# Patient Record
Sex: Male | Born: 1995 | Race: White | Hispanic: No | Marital: Single | Smoking: Never smoker
Health system: Southern US, Community
[De-identification: ages and names within clinical notes are randomized; demographics above are authoritative.]

## PROBLEM LIST (undated history)

## (undated) DIAGNOSIS — R000 Tachycardia, unspecified: Secondary | ICD-10-CM

## (undated) DIAGNOSIS — I4891 Unspecified atrial fibrillation: Secondary | ICD-10-CM

## (undated) HISTORY — PX: TYMPANOSTOMY TUBE PLACEMENT: SHX32

## (undated) HISTORY — PX: NO PAST SURGERIES: SHX2092

## (undated) HISTORY — PX: ABLATION: SHX5711

---
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
## 2016-09-17 ENCOUNTER — Encounter: Payer: Self-pay | Admitting: Emergency Medicine

## 2016-09-17 ENCOUNTER — Inpatient Hospital Stay
Admission: EM | Admit: 2016-09-17 | Discharge: 2016-09-19 | DRG: 917 | Disposition: A | Discharge status: Home / Self Care | Payer: Managed Care, Other (non HMO) | Attending: Internal Medicine | Admitting: Internal Medicine

## 2016-09-17 ENCOUNTER — Emergency Department: Payer: Managed Care, Other (non HMO)

## 2016-09-17 DIAGNOSIS — T450X1A Poisoning by antiallergic and antiemetic drugs, accidental (unintentional), initial encounter: Principal | ICD-10-CM | POA: Diagnosis present

## 2016-09-17 DIAGNOSIS — D72829 Elevated white blood cell count, unspecified: Secondary | ICD-10-CM | POA: Diagnosis present

## 2016-09-17 DIAGNOSIS — R000 Tachycardia, unspecified: Secondary | ICD-10-CM | POA: Diagnosis present

## 2016-09-17 DIAGNOSIS — G92 Toxic encephalopathy: Secondary | ICD-10-CM | POA: Diagnosis present

## 2016-09-17 DIAGNOSIS — F23 Brief psychotic disorder: Secondary | ICD-10-CM | POA: Diagnosis present

## 2016-09-17 DIAGNOSIS — T50901A Poisoning by unspecified drugs, medicaments and biological substances, accidental (unintentional), initial encounter: Secondary | ICD-10-CM | POA: Diagnosis present

## 2016-09-17 DIAGNOSIS — G9340 Encephalopathy, unspecified: Secondary | ICD-10-CM | POA: Diagnosis not present

## 2016-09-17 DIAGNOSIS — T50901D Poisoning by unspecified drugs, medicaments and biological substances, accidental (unintentional), subsequent encounter: Secondary | ICD-10-CM | POA: Diagnosis not present

## 2016-09-17 DIAGNOSIS — E876 Hypokalemia: Secondary | ICD-10-CM | POA: Diagnosis present

## 2016-09-17 LAB — URINE DRUG SCREEN, QUALITATIVE (ARMC ONLY)
Amphetamines, Ur Screen: NOT DETECTED
BARBITURATES, UR SCREEN: NOT DETECTED
BENZODIAZEPINE, UR SCRN: NOT DETECTED
CANNABINOID 50 NG, UR ~~LOC~~: NOT DETECTED
Cocaine Metabolite,Ur ~~LOC~~: NOT DETECTED
MDMA (Ecstasy)Ur Screen: NOT DETECTED
METHADONE SCREEN, URINE: NOT DETECTED
OPIATE, UR SCREEN: NOT DETECTED
PHENCYCLIDINE (PCP) UR S: NOT DETECTED
Tricyclic, Ur Screen: POSITIVE — AB

## 2016-09-17 LAB — COMPREHENSIVE METABOLIC PANEL
ALT: 21 U/L (ref 17–63)
AST: 26 U/L (ref 15–41)
Albumin: 4.9 g/dL (ref 3.5–5.0)
Alkaline Phosphatase: 69 U/L (ref 38–126)
Anion gap: 12 (ref 5–15)
BUN: 14 mg/dL (ref 6–20)
CHLORIDE: 102 mmol/L (ref 101–111)
CO2: 23 mmol/L (ref 22–32)
CREATININE: 1.05 mg/dL (ref 0.61–1.24)
Calcium: 9.5 mg/dL (ref 8.9–10.3)
GFR calc non Af Amer: >60 mL/min (ref >60)
Glucose, Bld: 107 mg/dL — ABNORMAL HIGH (ref 65–99)
Potassium: 3 mmol/L — ABNORMAL LOW (ref 3.5–5.1)
SODIUM: 137 mmol/L (ref 135–145)
Total Bilirubin: 0.9 mg/dL (ref 0.3–1.2)
Total Protein: 8.1 g/dL (ref 6.5–8.1)

## 2016-09-17 LAB — URINALYSIS, COMPLETE (UACMP) WITH MICROSCOPIC
Bacteria, UA: NONE SEEN
Bilirubin Urine: NEGATIVE
GLUCOSE, UA: NEGATIVE mg/dL
Hgb urine dipstick: NEGATIVE
KETONES UR: NEGATIVE mg/dL
Leukocytes, UA: NEGATIVE
Nitrite: NEGATIVE
PROTEIN: NEGATIVE mg/dL
RBC / HPF: NONE SEEN RBC/hpf (ref 0–5)
Specific Gravity, Urine: 1.012 (ref 1.005–1.030)
Squamous Epithelial / LPF: NONE SEEN
pH: 7 (ref 5.0–8.0)

## 2016-09-17 LAB — CBC WITH DIFFERENTIAL/PLATELET
Basophils Absolute: 0.1 10*3/uL (ref 0–0.1)
Basophils Relative: 0 %
EOS ABS: 0 10*3/uL (ref 0–0.7)
EOS PCT: 0 %
HCT: 45.1 % (ref 40.0–52.0)
Hemoglobin: 15.6 g/dL (ref 13.0–18.0)
LYMPHS ABS: 1.4 10*3/uL (ref 1.0–3.6)
Lymphocytes Relative: 7 %
MCH: 29.7 pg (ref 26.0–34.0)
MCHC: 34.5 g/dL (ref 32.0–36.0)
MCV: 86 fL (ref 80.0–100.0)
MONO ABS: 1 10*3/uL (ref 0.2–1.0)
Monocytes Relative: 5 %
Neutro Abs: 18 10*3/uL — ABNORMAL HIGH (ref 1.4–6.5)
Neutrophils Relative %: 88 %
PLATELETS: 221 10*3/uL (ref 150–440)
RBC: 5.25 MIL/uL (ref 4.40–5.90)
RDW: 12.8 % (ref 11.5–14.5)
WBC: 20.4 10*3/uL — AB (ref 3.8–10.6)

## 2016-09-17 LAB — ETHANOL: Alcohol, Ethyl (B): <5 mg/dL (ref <5)

## 2016-09-17 LAB — LACTIC ACID, PLASMA: LACTIC ACID, VENOUS: 1.1 mmol/L (ref 0.5–1.9)

## 2016-09-17 LAB — ACETAMINOPHEN LEVEL: Acetaminophen (Tylenol), Serum: <10 ug/mL — ABNORMAL LOW (ref 10–30)

## 2016-09-17 LAB — SALICYLATE LEVEL: Salicylate Lvl: <7 mg/dL (ref 2.8–30.0)

## 2016-09-17 LAB — TROPONIN I: Troponin I: <0.03 ng/mL (ref <0.03)

## 2016-09-17 MED ORDER — LORAZEPAM 2 MG/ML IJ SOLN
2.0000 mg | Freq: Once | INTRAMUSCULAR | Status: AC
  Administered 2016-09-17: 2 mg via INTRAVENOUS

## 2016-09-17 MED ORDER — HALOPERIDOL LACTATE 5 MG/ML IJ SOLN
5.0000 mg | Freq: Once | INTRAMUSCULAR | Status: AC
  Administered 2016-09-17: 5 mg via INTRAVENOUS

## 2016-09-17 MED ORDER — HALOPERIDOL LACTATE 5 MG/ML IJ SOLN
INTRAMUSCULAR | Status: AC
  Filled 2016-09-17: qty 1

## 2016-09-17 MED ORDER — SODIUM CHLORIDE 0.9 % IV SOLN
1000.0000 mL | Freq: Once | INTRAVENOUS | Status: AC
  Administered 2016-09-17: 1000 mL via INTRAVENOUS

## 2016-09-17 MED ORDER — LORAZEPAM 2 MG/ML IJ SOLN
2.0000 mg | Freq: Once | INTRAMUSCULAR | Status: AC
  Administered 2016-09-17: 2 mg via INTRAVENOUS
  Filled 2016-09-17: qty 1

## 2016-09-17 MED ORDER — LORAZEPAM 2 MG/ML IJ SOLN
INTRAMUSCULAR | Status: AC
  Administered 2016-09-17: 2 mg via INTRAVENOUS
  Filled 2016-09-17: qty 1

## 2016-09-17 MED ORDER — HALOPERIDOL LACTATE 5 MG/ML IJ SOLN
5.0000 mg | Freq: Four times a day (QID) | INTRAMUSCULAR | Status: DC | PRN
  Administered 2016-09-18: 5 mg via INTRAVENOUS
  Filled 2016-09-17: qty 1

## 2016-09-17 MED ORDER — SODIUM CHLORIDE 0.9 % IV SOLN: Freq: Once | INTRAVENOUS | Status: DC

## 2016-09-17 NOTE — ED Notes (Signed)
Pt trying to get out of bed, swinging at myself and friend who is at bedside.  MD notified.  See new orders.

## 2016-09-17 NOTE — ED Notes (Signed)
Pt remains agitated and delirious, trying to get up out of bed.  MD notified.  See new orders.

## 2016-09-17 NOTE — ED Notes (Addendum)
Pt agitated, trying to get up out of bed.  No PRN medication orders available at this time.  Admitting MD notified.

## 2016-09-17 NOTE — ED Triage Notes (Signed)
Pt arrived with girlfriend. Pt girlfriend states she found him laying in the bed acting the way he is in triage. Pt appears to be hallucinating. Pt staring at wall, when asked if he is seeing anything, pt states he is seeing cars on the wall. Pt denies drug use. Pt girlfriend states he does not use drugs. Pt appears confused in triage.

## 2016-09-17 NOTE — ED Notes (Signed)
Pt bed changed by this RN and Mayra, EDT. PT remains comfortable in bed at this time in NAD with family at bedside.

## 2016-09-17 NOTE — ED Notes (Signed)
Attempted to stand pt at bedside to use urinal; pt too delirious and incoherent to urinate at this time, will attempt for urine specimen at a later time.  MD aware.

## 2016-09-17 NOTE — H&P (Signed)
[facility] [place] [facility]   PATIENT NAME: Allen Wilson    MR#:  [id number]  DATE OF BIRTH:  Nov 13, 1995  DATE OF ADMISSION:  09/17/2016  PRIMARY CARE PHYSICIAN: Patient, No Pcp Per   REQUESTING/REFERRING PHYSICIAN: Emily FilbertWilliams, Jonathan E, MD  CHIEF COMPLAINT:   Chief Complaint  Patient presents with  . Possible Drug Overdose  . Hallucinations   HISTORY OF PRESENT ILLNESS:  Allen Wilson  is a 21 y.o. male with No known medical problem is being admitted for altered mental status, hallucination, likely due to drug overdose. Patient arrived with his girlfriend who states she found him lying in the bed acting the way he is currently. Patient is brought and apparently hallucinating and confused. Patient denies any drug use, was possibly seen at a vapor shop earlier today. He has no history of drug or alcohol abuse.  His mother is also at bedside providing most of the information.  She reports patient having some cold symptoms last night and tried taking some sinus medication and possibly Mucinex. He went to work today and come back around lunchtime at home, was found to be confused.  He was also vomiting and took some Emetrol per mother.  He does have some mouth dryness Per family. PAST MEDICAL HISTORY:  History reviewed. No pertinent past medical history. None PAST SURGICAL HISTORY:  No past surgical history on file. None SOCIAL HISTORY:   Social History  Substance Use Topics  . Smoking status: Unknown If Ever Smoked  . Smokeless tobacco: Not on file  . Alcohol use Not on file  He works as a Medical laboratory scientific officerdetailing car shop FAMILY HISTORY:  No family history on file. Mother with rheumatoid arthritis and father with hypertension DRUG ALLERGIES:  No Known Allergies  REVIEW OF SYSTEMS:   Review of Systems  Constitutional: Negative for chills, fever and weight loss.  HENT: Positive for congestion. Negative for nosebleeds and sore throat.   Eyes: Negative for  blurred vision.  Respiratory: Negative for cough, shortness of breath and wheezing.   Cardiovascular: Negative for chest pain, orthopnea, leg swelling and PND.  Gastrointestinal: Negative for abdominal pain, constipation, diarrhea, heartburn, nausea and vomiting.  Genitourinary: Negative for dysuria and urgency.  Musculoskeletal: Negative for back pain.  Skin: Negative for rash.  Neurological: Negative for dizziness, speech change, focal weakness and headaches.  Endo/Heme/Allergies: Does not bruise/bleed easily.  Psychiatric/Behavioral: Positive for hallucinations. Negative for depression. The patient is nervous/anxious.    MEDICATIONS AT HOME:   Prior to Admission medications   Not on File    He does not take any medications.  No over-the-counter medications or herbal medications  VITAL SIGNS:  Blood pressure (!) 129/104, pulse (!) 152, temperature 98.7 F (37.1 C), temperature source Oral, resp. rate (!) 23, height 6\' 1"  (1.854 m), weight 90.7 kg (200 lb), SpO2 98 %. PHYSICAL EXAMINATION:  Physical Exam  GENERAL:  21 y.o.-year-old patient lying in the bed with no acute distress.  EYES: Pupils equal, round, reactive to light and accommodation. No scleral icterus. Extraocular muscles intact.  HEENT: Head atraumatic, normocephalic. Oropharynx and nasopharynx clear.  NECK:  Supple, no jugular venous distention. No thyroid enlargement, no tenderness.  LUNGS: Normal breath sounds bilaterally, no wheezing, rales,rhonchi or crepitation. No use of accessory muscles of respiration.  CARDIOVASCULAR: S1, S2 normal. No murmurs, rubs, or gallops.  ABDOMEN: Soft, nontender, nondistended. Bowel sounds present. No organomegaly or mass.  EXTREMITIES: No pedal edema, cyanosis, or clubbing.  NEUROLOGIC: Cranial  nerves II through XII are intact. Muscle strength 5/5 in all extremities. Sensation intact. Gait not checked.  PSYCHIATRIC: The patient is alert and actively hallucinating.  Seems very  agitated SKIN: No obvious rash, lesion, or ulcer.  LABORATORY PANEL:   CBC  Recent Labs Lab 09/17/16 1644  WBC 20.4*  HGB 15.6  HCT 45.1  PLT 221   ------------------------------------------------------------------------------------------------------------------  Chemistries   Recent Labs Lab 09/17/16 1644  NA 137  K 3.0*  CL 102  CO2 23  GLUCOSE 107*  BUN 14  CREATININE 1.05  CALCIUM 9.5  AST 26  ALT 21  ALKPHOS 69  BILITOT 0.9   ------------------------------------------------------------------------------------------------------------------  Cardiac Enzymes  Recent Labs Lab 09/17/16 1644  TROPONINI <0.03   ------------------------------------------------------------------------------------------------------------------  RADIOLOGY:  Ct Head Wo Contrast  Result Date: 09/17/2016 CLINICAL DATA:  Altered mental status. EXAM: CT HEAD WITHOUT CONTRAST TECHNIQUE: Contiguous axial images were obtained from the base of the skull through the vertex without intravenous contrast. COMPARISON:  None. FINDINGS: Brain: No evidence of acute infarction, hemorrhage, hydrocephalus, extra-axial collection or mass lesion/mass effect. Vascular: No hyperdense vessel or unexpected calcification. Skull: Normal. Negative for fracture or focal lesion. Sinuses/Orbits: The bilateral paranasal sinuses and mastoid air cells are clear. The orbits are unremarkable. Other: None. IMPRESSION: 1. Normal noncontrast CT of the brain. Electronically Signed   By: Obie DredgeWilliam T Derry M.D.   On: 09/17/2016 17:38   IMPRESSION AND PLAN:   21 year old male with possible drug overdose   * Drug overdose  - His urine tox is positive for TCA.  Unknown ingestion  - Symptoms are consistent with anticholinergic toxicity  - EDP have contacted poison control and physostigmine was not recommended at this time.  - His serial EKGs look good with no QRS widening - We will admit him to ICU/stepdown  - Case discussed  with ICU nurse practitioner - Bincy  * Hypokalemia  - Replete and recheck   * Agitation/hallucinations - May need Ativan/Haldol to keep him calm - May require a sitter  * Leukocytosis - Likely stress response - He does not have any fever, we will hold off starting any antibiotics or ordering an infectious workup     All the records are reviewed and case discussed with ED provider. Management plans discussed with the patient, family (girlfriend and mother at bedside) and they are in agreement.  CODE STATUS: Full code  TOTAL TIME TAKING CARE OF THIS PATIENT: 45 minutes.    Delfino LovettVipul Wanisha Shiroma M.D on 09/17/2016 at 10:18 PM  Between 7am to 6pm - Pager - [phone]  After 6pm go to [email or web address] - Social research officer, [email or web address] [email or web address]  [facility] [place] [facility]  Office  [phone]  CC: Primary care physician; Patient, No Pcp Per   Note: This dictation was prepared with Dragon dictation along with smaller phrase technology. Any transcriptional errors that result from this process are unintentional.

## 2016-09-17 NOTE — ED Notes (Signed)
Pt has urinated on himself; wet clothing removed, new gown placed, partial linen change done.

## 2016-09-17 NOTE — ED Notes (Signed)
This RN to bedside at this time to in/out cath pt to receive urine specimen per verbal MD order.  Susy FrizzleMatt, RN and Theodoro Gristave, ED Medic at bedside to assist.

## 2016-09-17 NOTE — ED Provider Notes (Signed)
[facility] Emergency Department Provider Note       Time seen: ----------------------------------------- 4:39 PM on 09/17/2016 -----------------------------------------   Level V caveat: History/ROS limited by altered mental status  I have reviewed the triage vital signs and the nursing notes.   HISTORY   Chief Complaint Possible Drug Overdose and Hallucinations    HPI Allen Wilson is a 21 y.o. male who presents to the ED for altered mental status. Patient arrived with his girlfriend who states she found him lying in the bed acting the way he is currently. Patient is brought and apparently hallucinating and confused. Patient denies any drug use, was possibly seen at a vapor shop earlier today. He has no history of drug or alcohol abuse. No further information is available.   History reviewed. No pertinent past medical history.  There are no active problems to display for this patient.   No past surgical history on file.  Allergies Patient has no known allergies.  Social History Social History  Substance Use Topics  . Smoking status: Unknown If Ever Smoked  . Smokeless tobacco: Not on file  . Alcohol use Not on file    Review of Systems Unknown, patient appears confused and psychotic  All systems negative/normal/unremarkable except as stated in the HPI  ____________________________________________   PHYSICAL EXAM:  VITAL SIGNS: ED Triage Vitals  Enc Vitals Group     BP 09/17/16 1630 (!) 151/102     Pulse Rate 09/17/16 1630 (!) 152     Resp 09/17/16 1630 18     Temp 09/17/16 1630 98.7 F (37.1 C)     Temp Source 09/17/16 1630 Oral     SpO2 09/17/16 1630 98 %     Weight 09/17/16 1628 200 lb (90.7 kg)     Height 09/17/16 1628 6\' 1"  (1.854 m)     Head Circumference --      Peak Flow --      Pain Score --      Pain Loc --      Pain Edu? --      Excl. in GC? --     Constitutional: Alert And disoriented, moderate to severe  distress  Eyes: Conjunctivae are normal. Normal extraocular movements. Pupils are dilated but reactive bilaterally ENT   Head: Normocephalic and atraumatic.   Nose: No congestion/rhinnorhea.   Mouth/Throat: Mucous membranes are moist.   Neck: No stridor. Cardiovascular: Rapid rate, regular rhythm. No murmurs, rubs, or gallops. Respiratory: Normal respiratory effort without tachypnea nor retractions. Breath sounds are clear and equal bilaterally. No wheezes/rales/rhonchi. Gastrointestinal: Soft and nontender. Normal bowel sounds Musculoskeletal: Nontender with normal range of motion in extremities. No lower extremity tenderness nor edema. Neurologic:  Normal speech and language. No gross focal neurologic deficits are appreciated.  Skin:  Skin is warm, dry and intact. No rash noted. Psychiatric: Mood and affect are normal. Speech and behavior are normal.  ____________________________________________  EKG: Interpreted by me. Sinus tachycardia with rate 106 bpm, normal PR interval, normal QRS, normal QT.  Repeat EKG reveals sinus tachycardia at a rate of 121 bpm, normal PR interval, normal QRS size, normal QT. ____________________________________________  ED COURSE:  Pertinent labs & imaging results that were available during my care of the patient were reviewed by me and considered in my medical decision making (see chart for details). Patient presents for altered mental status and psychosis, we will assess with labs and imaging as indicated. Possible anticholinergic toxicity Clinical Course as of Sep 18 2106  Wed Sep 17, 2016  1939 Patient has received 6 mg of IV Ativan without significant improvement. He is still agitated, I will give IV Haldol.  [JW]    Clinical Course User Index [JW] Emily Filbert, MD   Procedures ____________________________________________   LABS (pertinent positives/negatives)  Labs Reviewed  CBC WITH DIFFERENTIAL/PLATELET - Abnormal;  Notable for the following:       Result Value   WBC 20.4 (*)    Neutro Abs 18.0 (*)    All other components within normal limits  COMPREHENSIVE METABOLIC PANEL - Abnormal; Notable for the following:    Potassium 3.0 (*)    Glucose, Bld 107 (*)    All other components within normal limits  URINALYSIS, COMPLETE (UACMP) WITH MICROSCOPIC - Abnormal; Notable for the following:    Color, Urine YELLOW (*)    APPearance CLEAR (*)    All other components within normal limits  URINE DRUG SCREEN, QUALITATIVE ([facility] ONLY) - Abnormal; Notable for the following:    Tricyclic, Ur Screen POSITIVE (*)    All other components within normal limits  ACETAMINOPHEN LEVEL - Abnormal; Notable for the following:    Acetaminophen (Tylenol), Serum <10 (*)    All other components within normal limits  CULTURE, BLOOD (ROUTINE X 2)  CULTURE, BLOOD (ROUTINE X 2)  TROPONIN I  ETHANOL  SALICYLATE LEVEL  LACTIC ACID, PLASMA  LACTIC ACID, PLASMA  CBG MONITORING, ED   CRITICAL CARE Performed by: Emily Filbert   Total critical care time: 30 minutes  Critical care time was exclusive of separately billable procedures and treating other patients.  Critical care was necessary to treat or prevent imminent or life-threatening deterioration.  Critical care was time spent personally by me on the following activities: development of treatment plan with patient and/or surrogate as well as nursing, discussions with consultants, evaluation of patient's response to treatment, examination of patient, obtaining history from patient or surrogate, ordering and performing treatments and interventions, ordering and review of laboratory studies, ordering and review of radiographic studies, pulse oximetry and re-evaluation of patient's condition.   RADIOLOGY Images were viewed by me  CT head  IMPRESSION: 1. Normal noncontrast CT of the brain. ____________________________________________  FINAL ASSESSMENT AND  PLAN  Acute psychosis, possible anticholinergic toxicity  Plan: Patient's labs and imaging were dictated above. Patient had presented for altered mental status and was extremely agitated and combative on arrival. This is an acute change in his mental status according to family. He was complaining of some cold symptoms and has taken over-the-counter cold medicine as well as over-the-counter anti-medic. Other than that he has not known to take illicit drugs or drink alcohol. He has not been describing suicidal behavior. Throughout his stay he has required intermittent medications for agitation including Ativan and Haldol. Symptoms are most likely consistent with anticholinergic toxicity and he did test positive for TCAs in his urine. I have contacted poison control and physostigmine was not recommended at this time. His serial EKGs look good with no QRS widening. Heart rate and blood pressure are reassuring. I do not think this is infectious in etiology because of the acute and severe agitation with delirium. At this point he has been observed for 6 hours and is still hallucinating requiring intermittent medications for sedation. I will discuss with the hospitalist for admission.   Emily Filbert, MD   Note: This note was generated in part or whole with voice recognition software. Voice recognition is usually quite  accurate but there are transcription errors that can and very often do occur. I apologize for any typographical errors that were not detected and corrected.     Emily FilbertWilliams, Jonathan E, MD 09/17/16 2129

## 2016-09-18 ENCOUNTER — Encounter: Payer: Self-pay | Admitting: *Deleted

## 2016-09-18 DIAGNOSIS — G9340 Encephalopathy, unspecified: Secondary | ICD-10-CM

## 2016-09-18 DIAGNOSIS — F23 Brief psychotic disorder: Secondary | ICD-10-CM

## 2016-09-18 LAB — BASIC METABOLIC PANEL
ANION GAP: 7 (ref 5–15)
BUN: 12 mg/dL (ref 6–20)
CALCIUM: 8.7 mg/dL — AB (ref 8.9–10.3)
CO2: 24 mmol/L (ref 22–32)
CREATININE: 1.06 mg/dL (ref 0.61–1.24)
Chloride: 108 mmol/L (ref 101–111)
GFR calc Af Amer: >60 mL/min (ref >60)
GFR calc non Af Amer: >60 mL/min (ref >60)
GLUCOSE: 134 mg/dL — AB (ref 65–99)
Potassium: 3.7 mmol/L (ref 3.5–5.1)
Sodium: 139 mmol/L (ref 135–145)

## 2016-09-18 LAB — BLOOD CULTURE ID PANEL (REFLEXED)
ACINETOBACTER BAUMANNII: NOT DETECTED
CANDIDA KRUSEI: NOT DETECTED
Candida albicans: NOT DETECTED
Candida glabrata: NOT DETECTED
Candida parapsilosis: NOT DETECTED
Candida tropicalis: NOT DETECTED
ENTEROCOCCUS SPECIES: NOT DETECTED
ESCHERICHIA COLI: NOT DETECTED
Enterobacter cloacae complex: NOT DETECTED
Enterobacteriaceae species: NOT DETECTED
Haemophilus influenzae: NOT DETECTED
Klebsiella oxytoca: NOT DETECTED
Klebsiella pneumoniae: NOT DETECTED
LISTERIA MONOCYTOGENES: NOT DETECTED
METHICILLIN RESISTANCE: NOT DETECTED
NEISSERIA MENINGITIDIS: NOT DETECTED
Proteus species: NOT DETECTED
Pseudomonas aeruginosa: NOT DETECTED
SERRATIA MARCESCENS: NOT DETECTED
STAPHYLOCOCCUS AUREUS BCID: NOT DETECTED
STREPTOCOCCUS AGALACTIAE: NOT DETECTED
STREPTOCOCCUS PNEUMONIAE: NOT DETECTED
STREPTOCOCCUS SPECIES: NOT DETECTED
Staphylococcus species: DETECTED — AB
Streptococcus pyogenes: NOT DETECTED

## 2016-09-18 LAB — CBC
HEMATOCRIT: 40.3 % (ref 40.0–52.0)
HEMOGLOBIN: 13.9 g/dL (ref 13.0–18.0)
MCH: 29.5 pg (ref 26.0–34.0)
MCHC: 34.4 g/dL (ref 32.0–36.0)
MCV: 85.7 fL (ref 80.0–100.0)
Platelets: 196 10*3/uL (ref 150–440)
RBC: 4.7 MIL/uL (ref 4.40–5.90)
RDW: 12.7 % (ref 11.5–14.5)
WBC: 11.2 10*3/uL — AB (ref 3.8–10.6)

## 2016-09-18 LAB — MRSA PCR SCREENING: MRSA BY PCR: NEGATIVE

## 2016-09-18 LAB — MAGNESIUM: Magnesium: 2 mg/dL (ref 1.7–2.4)

## 2016-09-18 LAB — GLUCOSE, CAPILLARY: Glucose-Capillary: 95 mg/dL (ref 65–99)

## 2016-09-18 LAB — PHOSPHORUS: Phosphorus: 4.8 mg/dL — ABNORMAL HIGH (ref 2.5–4.6)

## 2016-09-18 LAB — PROCALCITONIN

## 2016-09-18 MED ORDER — DEXMEDETOMIDINE HCL IN NACL 400 MCG/100ML IV SOLN
0.4000 ug/kg/h | INTRAVENOUS | Status: DC
  Administered 2016-09-18: 1.1 ug/kg/h via INTRAVENOUS
  Administered 2016-09-18: 0.4 ug/kg/h via INTRAVENOUS
  Filled 2016-09-18 (×2): qty 100

## 2016-09-18 MED ORDER — LORAZEPAM 2 MG/ML IJ SOLN
1.0000 mg | INTRAMUSCULAR | Status: DC | PRN
  Administered 2016-09-18: 1 mg via INTRAVENOUS
  Filled 2016-09-18: qty 1

## 2016-09-18 MED ORDER — POTASSIUM CHLORIDE 2 MEQ/ML IV SOLN
INTRAVENOUS | Status: DC
  Administered 2016-09-18: 11:00:00 via INTRAVENOUS
  Filled 2016-09-18 (×4): qty 1000

## 2016-09-18 MED ORDER — FAMOTIDINE IN NACL 20-0.9 MG/50ML-% IV SOLN
20.0000 mg | Freq: Two times a day (BID) | INTRAVENOUS | Status: DC
  Administered 2016-09-18 (×3): 20 mg via INTRAVENOUS
  Filled 2016-09-18 (×4): qty 50

## 2016-09-18 MED ORDER — ENOXAPARIN SODIUM 40 MG/0.4ML ~~LOC~~ SOLN
40.0000 mg | SUBCUTANEOUS | Status: DC
  Administered 2016-09-19: 40 mg via SUBCUTANEOUS
  Filled 2016-09-18: qty 0.4

## 2016-09-18 MED ORDER — SODIUM CHLORIDE 0.9 % IV SOLN
INTRAVENOUS | Status: DC
  Administered 2016-09-18: 01:00:00 via INTRAVENOUS
  Administered 2016-09-18: 125 mL/h via INTRAVENOUS

## 2016-09-18 MED ORDER — SODIUM CHLORIDE 0.9 % IV SOLN: 250.0000 mL | INTRAVENOUS | Status: DC | PRN

## 2016-09-18 NOTE — Consult Note (Signed)
Name: Allen Wilson MRN: [id number] DOB: 13-Aug-1995    ADMISSION DATE:  09/17/2016  CONSULTATION DATE:  09/17/16  REFERRING MD :  Dr. Sherryll BurgerShah  CHIEF COMPLAINT:  Drug overdose  BRIEF PATIENT DESCRIPTION: 21 year old male with drug overdose. UDS positive for TCA  SIGNIFICANT EVENTS  8/1 Patient admitted to the SDU with drug overdose  STUDIES:  8/1 UDS positive for tricyclics   HISTORY OF PRESENT ILLNESS:  Allen Wilson is a 21 year old male with no known medical problems. [facility] 8/1 due possible drug overdose. Patient is confused therefore history is obtained from the chart and the staff. His girlfriend found him on the bed confused and hallucinating.She reports that  patient having some cold symptoms last night and tried taking some sinus medication and possibly Mucinex. He went to work today and come back around lunchtime at home, was found to be confused.  He was also vomiting and took some Emetrol per mother.  He does have some mouth dryness Per family.  His UDS was positive for tricyclics.  PAST MEDICAL HISTORY :   has no past medical history on file.  has no past surgical history on file. Prior to Admission medications   Not on File   No Known Allergies  FAMILY HISTORY:  family history is not on file. SOCIAL HISTORY:    REVIEW OF SYSTEMS:   Constitutional: Negative for fever, chills, weight loss, malaise/fatigue and diaphoresis.  HENT: Negative for hearing loss, ear pain, nosebleeds, congestion, sore throat, neck pain, tinnitus and ear discharge.   Eyes: Negative for blurred vision, double vision, photophobia, pain, discharge and redness.  Respiratory: Negative for cough, hemoptysis, sputum production, shortness of breath, wheezing and stridor.   Cardiovascular: Negative for chest pain, palpitations, orthopnea, claudication, leg swelling and PND.  Gastrointestinal: Negative for heartburn, nausea, vomiting, abdominal pain, diarrhea, constipation,  blood in stool and melena.  Genitourinary: Negative for dysuria, urgency, frequency, hematuria and flank pain.  Musculoskeletal: Negative for myalgias, back pain, joint pain and falls.  Skin: Negative for itching and rash.  Neurological: Negative for dizziness, tingling, tremors, sensory change, speech change, focal weakness, seizures, loss of consciousness, weakness and headaches.  Endo/Heme/Allergies: Negative for environmental allergies and polydipsia. Does not bruise/bleed easily.  SUBJECTIVE: patient is confused and hallucinating  VITAL SIGNS: Temp:  [98.7 F (37.1 C)-100.2 F (37.9 C)] 100.2 F (37.9 C) (08/02 0100) Pulse Rate:  [99-152] 99 (08/02 0100) Resp:  [15-28] 23 (08/02 0100) BP: (118-156)/(85-104) 156/85 (08/02 0100) SpO2:  [96 %-100 %] 100 % (08/02 0100) Weight:  [90.7 kg (200 lb)-93.2 kg (205 lb 7.5 oz)] 93.2 kg (205 lb 7.5 oz) (08/02 0100)  PHYSICAL EXAMINATION: General:  Young male, confused, but in no acute distress Neuro:  confused HEENT:  AT,,No JVD Cardiovascular:  S1S2,Regular, no m/r/g Lungs: Clear Bilaterally, no wheezes,crackles,rhonchi Abdomen:  Soft.NT,ND Musculoskeletal:  N o edema, cyanosis noted Skin: Warm,, dry and intact   Recent Labs Lab 09/17/16 1644  NA 137  K 3.0*  CL 102  CO2 23  BUN 14  CREATININE 1.05  GLUCOSE 107*    Recent Labs Lab 09/17/16 1644  HGB 15.6  HCT 45.1  WBC 20.4*  PLT 221   Ct Head Wo Contrast  Result Date: 09/17/2016 CLINICAL DATA:  Altered mental status. EXAM: CT HEAD WITHOUT CONTRAST TECHNIQUE: Contiguous axial images were obtained from the base of the skull through the vertex without intravenous contrast. COMPARISON:  None. FINDINGS: Brain: No evidence  of acute infarction, hemorrhage, hydrocephalus, extra-axial collection or mass lesion/mass effect. Vascular: No hyperdense vessel or unexpected calcification. Skull: Normal. Negative for fracture or focal lesion. Sinuses/Orbits: The bilateral paranasal  sinuses and mastoid air cells are clear. The orbits are unremarkable. Other: None. IMPRESSION: 1. Normal noncontrast CT of the brain. Electronically Signed   By: Obie DredgeWilliam T Derry M.D.   On: 09/17/2016 17:38    ASSESSMENT / PLAN: Drug Overdose with Tricyclics Leukocytocis related to stress response Hypokalemia Hallucinations   Plan V/S currently stable Ativan/Haldol prn for agitation Replace electrolytes per usual guideline No need of antibiotics now   Bincy Varughese,AG-ACNP Pulmonary & Critical Care 09/18/2016, 1:20 AM   PCCM ATTENDING ATTESTATION:  I have evaluated patient with the APP Varughese, reviewed database in its entirety and discussed care plan in detail. In addition, this patient was discussed on multidisciplinary rounds.   Important exam findings: Somnolent Poorly oriented NAD HEENT WNL Chest clear Regular, no murmur NABS Extremities warm, no edema MAEs, DTRs symmetric  Major problems addressed by PCCM team: Acute encephalopathy - unclear etiology  Patient's girlfriend and mother feel certain that there were no intentional ingestions. He has no access to prescription medications in the house. He was combated with last night but presently very somnolent. Precedex has been discontinued. Besides somnolence, physical exam is unremarkable.  PLAN/REC: Monitor in ICU/SDU off Precedex. If he does not require further Precedex, later this afternoon he may be safely transferred out of the ICU/SDU. Hopefully, as his sensorium improves, he will be able to shed some light on things.    Billy Fischeravid Dajuana Palen, MD PCCM service Mobile [phone] Pager [phone] 09/18/2016 2:53 PM

## 2016-09-18 NOTE — Progress Notes (Signed)
This patient is A&O x 4, denies any complaints with the exception of being "tired". VSS. He answers all questions appropriately; neuro exam is unremarkable with the exception of drowsiness. He is drinking water, denies wanting anything to eat. Patient is requesting to go home. Has ambulated to the toilet in room multiple times w/o difficulty and assistance. Will update MD/NP during afternoon rounds.

## 2016-09-18 NOTE — Progress Notes (Signed)
[facility] - [place] [facility]   PATIENT NAME: Allen Wilson    MR#:  [id number]  DATE OF BIRTH:  [birth year]  SUBJECTIVE:  CHIEF COMPLAINT:   Chief Complaint  Patient presents with  . Possible Drug Overdose  . Hallucinations   Agitated overnight Off precedex drip now  REVIEW OF SYSTEMS:    Review of Systems  Unable to perform ROS: Mental status change    DRUG ALLERGIES:  No Known Allergies  VITALS:  Blood pressure (!) 120/54, pulse 86, temperature 98.4 F (36.9 C), temperature source Oral, resp. rate (!) 21, height 6\' 1"  (1.854 m), weight 93.2 kg (205 lb 7.5 oz), SpO2 98 %.  PHYSICAL EXAMINATION:   Physical Exam  GENERAL:  21 y.o.-year-old patient lying in the bed with no acute distress.  EYES: Pupils equal, round, reactive to light and accommodation. No scleral icterus. Extraocular muscles intact.  HEENT: Head atraumatic, normocephalic. Oropharynx and nasopharynx clear.  NECK:  Supple, no jugular venous distention. No thyroid enlargement, no tenderness.  LUNGS: Normal breath sounds bilaterally, no wheezing, rales, rhonchi. No use of accessory muscles of respiration.  CARDIOVASCULAR: S1, S2 normal. No murmurs, rubs, or gallops.  ABDOMEN: Soft, nontender, nondistended. Bowel sounds present. No organomegaly or mass.  EXTREMITIES: No cyanosis, clubbing or edema b/l.    NEUROLOGIC: Cranial nerves II through XII are intact. No focal Motor or sensory deficits b/l.   PSYCHIATRIC: The patient is drowzy SKIN: No obvious rash, lesion, or ulcer.   LABORATORY PANEL:   CBC  Recent Labs Lab 09/18/16 0638  WBC 11.2*  HGB 13.9  HCT 40.3  PLT 196   ------------------------------------------------------------------------------------------------------------------ Chemistries   Recent Labs Lab 09/17/16 1644 09/18/16 0638  NA 137 139  K 3.0* 3.7  CL 102 108  CO2 23 24  GLUCOSE 107* 134*  BUN 14 12  CREATININE 1.05 1.06  CALCIUM 9.5 8.7*  MG   --  2.0  AST 26  --   ALT 21  --   ALKPHOS 69  --   BILITOT 0.9  --    ------------------------------------------------------------------------------------------------------------------  Cardiac Enzymes  Recent Labs Lab 09/17/16 1644  TROPONINI <0.03   ------------------------------------------------------------------------------------------------------------------  RADIOLOGY:  Ct Head Wo Contrast  Result Date: 09/17/2016 CLINICAL DATA:  Altered mental status. EXAM: CT HEAD WITHOUT CONTRAST TECHNIQUE: Contiguous axial images were obtained from the base of the skull through the vertex without intravenous contrast. COMPARISON:  None. FINDINGS: Brain: No evidence of acute infarction, hemorrhage, hydrocephalus, extra-axial collection or mass lesion/mass effect. Vascular: No hyperdense vessel or unexpected calcification. Skull: Normal. Negative for fracture or focal lesion. Sinuses/Orbits: The bilateral paranasal sinuses and mastoid air cells are clear. The orbits are unremarkable. Other: None. IMPRESSION: 1. Normal noncontrast CT of the brain. Electronically Signed   By: Obie DredgeWilliam T Derry M.D.   On: 09/17/2016 17:38     ASSESSMENT AND PLAN:   * Acute toxic encephalopathy Etiology unclear but overdose suspected. Today is doing better but drowzy. Tele Monitor Off precedex  * Leucocytosis No source of infection Off abx  All the records are reviewed and case discussed with Care Management/Social Workerr. Management plans discussed with the patient, family and they are in agreement.  CODE STATUS: FULL  DVT Prophylaxis: SCDs  TOTAL TIME TAKING CARE OF THIS PATIENT: 35 minutes.   POSSIBLE D/C IN 1-2 DAYS, DEPENDING ON CLINICAL CONDITION.  Milagros LollSudini, Ronon Ferger R M.D on 09/18/2016 at 11:55 PM  Between 7am to 6pm - Pager - [phone]  After 6pm  go to [email or web address] - password [email or web address]  [facility] [place] [facility]  Office  [phone]  CC: Primary care physician; Patient, No Pcp  Per  Note: This dictation was prepared with Dragon dictation along with smaller phrase technology. Any transcriptional errors that result from this process are unintentional.

## 2016-09-18 NOTE — Progress Notes (Signed)
eLink Physician-Brief Progress Note Patient Name: Allen Wilson DOB: 27-Feb-1995 MRN: [id number]   Date of Service  09/18/2016  HPI/Events of Note  Admitted for OD, VS stable, not on vasopressors  eICU Interventions  Transfer patient to floor.     Intervention Category Evaluation Type: New Patient Evaluation  Erin FullingKurian Nisaiah Bechtol 09/18/2016, 12:43 AM

## 2016-09-18 NOTE — Progress Notes (Signed)
PHARMACY - PHYSICIAN COMMUNICATION CRITICAL VALUE ALERT - BLOOD CULTURE IDENTIFICATION (BCID)  Results for orders placed or performed during the hospital encounter of 09/17/16  Blood Culture ID Panel (Reflexed) (Collected: 09/17/2016 11:16 PM)  Result Value Ref Range   Enterococcus species NOT DETECTED NOT DETECTED   Listeria monocytogenes NOT DETECTED NOT DETECTED   Staphylococcus species DETECTED (A) NOT DETECTED   Staphylococcus aureus NOT DETECTED NOT DETECTED   Methicillin resistance NOT DETECTED NOT DETECTED   Streptococcus species NOT DETECTED NOT DETECTED   Streptococcus agalactiae NOT DETECTED NOT DETECTED   Streptococcus pneumoniae NOT DETECTED NOT DETECTED   Streptococcus pyogenes NOT DETECTED NOT DETECTED   Acinetobacter baumannii NOT DETECTED NOT DETECTED   Enterobacteriaceae species NOT DETECTED NOT DETECTED   Enterobacter cloacae complex NOT DETECTED NOT DETECTED   Escherichia coli NOT DETECTED NOT DETECTED   Klebsiella oxytoca NOT DETECTED NOT DETECTED   Klebsiella pneumoniae NOT DETECTED NOT DETECTED   Proteus species NOT DETECTED NOT DETECTED   Serratia marcescens NOT DETECTED NOT DETECTED   Haemophilus influenzae NOT DETECTED NOT DETECTED   Neisseria meningitidis NOT DETECTED NOT DETECTED   Pseudomonas aeruginosa NOT DETECTED NOT DETECTED   Candida albicans NOT DETECTED NOT DETECTED   Candida glabrata NOT DETECTED NOT DETECTED   Candida krusei NOT DETECTED NOT DETECTED   Candida parapsilosis NOT DETECTED NOT DETECTED   Candida tropicalis NOT DETECTED NOT DETECTED    Name of physician (or Provider) Contacted: Dr. Celene SkeenNester via e-link for ICU  Changes to prescribed antibiotics required: none. MD considering this as contamination since it is 1 bottle(anaerobic) of 4 that is positive with Staph species, MecA negative.   Harsha Yusko A 09/18/2016  8:41 PM

## 2016-09-18 NOTE — Progress Notes (Signed)
Annabelle Harmanana, NP spoke w/ this patient and along w/ his father, girlfriend, and this Charity fundraiserN. Safety and observation overnight were explained. The patient was very receptive of explanation and agrees to stay. Father is going to get patient something to eat. Patient is calm and cooperative.

## 2016-09-18 NOTE — Progress Notes (Signed)
[facility] She was given an update on this patient. She stated that they were going to close this patient's case and to call them back if anything additional assistance is needed.

## 2016-09-18 NOTE — Progress Notes (Signed)
I've also notified e-ICU attending Dr Kasa. 

## 2016-09-19 ENCOUNTER — Inpatient Hospital Stay (HOSPITAL_COMMUNITY)
Admit: 2016-09-19 | Discharge: 2016-09-19 | Disposition: A | Discharge status: Home / Self Care | Payer: Managed Care, Other (non HMO) | Attending: Pulmonary Disease | Admitting: Pulmonary Disease

## 2016-09-19 ENCOUNTER — Encounter: Payer: Self-pay | Admitting: Nurse Practitioner

## 2016-09-19 DIAGNOSIS — T50901D Poisoning by unspecified drugs, medicaments and biological substances, accidental (unintentional), subsequent encounter: Secondary | ICD-10-CM

## 2016-09-19 DIAGNOSIS — G9340 Encephalopathy, unspecified: Secondary | ICD-10-CM

## 2016-09-19 DIAGNOSIS — R000 Tachycardia, unspecified: Secondary | ICD-10-CM

## 2016-09-19 LAB — ECHOCARDIOGRAM COMPLETE
HEIGHTINCHES: 73 in
WEIGHTICAEL: 3255.75 [oz_av]

## 2016-09-19 LAB — CULTURE, BLOOD (ROUTINE X 2): SPECIAL REQUESTS: ADEQUATE

## 2016-09-19 LAB — TSH: TSH: 0.985 u[IU]/mL (ref 0.350–4.500)

## 2016-09-19 LAB — HIV ANTIBODY (ROUTINE TESTING W REFLEX): HIV Screen 4th Generation wRfx: NONREACTIVE

## 2016-09-19 MED ORDER — METOPROLOL TARTRATE 25 MG PO TABS: 25.0000 mg | ORAL_TABLET | Freq: Two times a day (BID) | ORAL | 1 refills | Status: DC | PRN

## 2016-09-19 MED ORDER — METOPROLOL TARTRATE 5 MG/5ML IV SOLN
5.0000 mg | Freq: Once | INTRAVENOUS | Status: AC
  Administered 2016-09-19: 5 mg via INTRAVENOUS
  Filled 2016-09-19: qty 5

## 2016-09-19 MED ORDER — METOPROLOL TARTRATE 25 MG PO TABS: 25.0000 mg | ORAL_TABLET | Freq: Two times a day (BID) | ORAL | Status: DC | PRN

## 2016-09-19 NOTE — Discharge Instructions (Signed)
Monitor heart rate and blood pressure

## 2016-09-19 NOTE — Discharge Summary (Signed)
Physician Discharge Summary  Patient ID: Allen Wilson MRN: [id number] DOB/AGE: 05-Feb-1996 21 y.o.  Admit date: 09/17/2016 Discharge date: 09/19/2016    Discharge Diagnoses:        Acute encephalopathy likely secondary to sedating medications-resolved       Hallucinations-resolved       False positive for tricyclics on urine drug screen due to benadryl ingestion       Sinus tachycardia likely secondary to anticholinergic effect of benadryl       Leukocytosis likely secondary to stress response-improved                                                                        DISCHARGE PLAN BY DIAGNOSIS    Acute encephalopathy likely secondary to sedating medications-resolved Hallucinations-resolved False positive for tricyclics on urine drug screen due to benadryl ingestion Plan: Instructed patient to avoid sedating medications for now and to take proper doses of medications as indicated and/or prescribed  If symptoms recur to return to the emergency room or go to his PCP  Sinus tachycardia with intermittent sinus arrhythmia likely secondary to anticholinergic effect of benadryl Plan: Echocardiogram 09/19/16 results revealed normal LV function per Cardiology  Per Cardiologist Dr. Donivan Scull recommendation prescribed outpatient prn metoprolol 25 mg bid for heart rate greater than 110 with instructions to hold if systolic bp less than 024  Instructed to monitor heart rate and blood pressure  Per Dr. Rockey Situ pt may contact his office if he needs further assistance  Follow-up with PCP within 2 weeks of discharge                 Broaddus is a 21 y.o. y/o male with no known PMH who presented to [facility] ER 08/1 with acute encephalopathy secondary to questionable drug overdose. According to patient's family the patient has chronic sinus congestion, therefore due to symptoms he took [employer] brand Mucinex and otc sinus medication. However, despite treatment symptoms  persisted and therefore he took a total of four 25 mg Benadryl capsules on 08/1.  Following ingestion of medications he went to work and when he returned home around lunchtime family noticed the patient was confused. He also vomited and took over-the-counter Emetrol at that time. According to patient's mother he has never taken Benadryl in the past for sinus congestion. Upon arrival to the ER patient noted to be hallucinating and severely agitated requiring involuntarily commitment.  Urine drug screen obtained and he was positive for tricyclics patient denies taking any tricyclics or illicit drugs use.  He does endorse drinking alcohol on occasion. He was subsequently admitted to the stepdown unit requiring Precedex drip for treatment of agitation and hallucinations. Precedex drip discontinued over 24 hrs, agitation and hallucinations have since resolved prior to discharge patient denies any suicidal or homicidal ideations. Therefore, patient declared competent and stable for discharge by Dr. Alva Garnet 08/3 and IVC commitment paperwork rescinded by Dr. Alva Garnet. On 08/3 it was noted pt was sinus tachycardic heart rate sustained in the 130's on cardiac monitor.  Therefore, Echocardiogram, EKG, and Cardiology consult ordered.  EKG revealed sinus tachycardia with possible atrial enlargement, no ST elevation noted and Echo results revealed normal LV function per Cardiology.  Cardiologist Dr. Rockey Situ recommended outpatient prn metoprolol for heart rate greater than 110 and pt stable for discharge home.  SIGNIFICANT DIAGNOSTIC STUDIES Echo 08/3>>EF 60% to 65%  SIGNIFICANT EVENTS 08/3-Pt admitted to the Stepdown Unit   MICRO DATA  Blood x2 08/1>>1 bottle positive with Staph species, 2nd bottle negative likely contaminate   ANTIBIOTICS None   CONSULTS Intensivist Cardiology   TUBES / LINES None    Discharge Exam: General: well developed, well nourished Caucasian male, resting in bed, no NAD Neuro: A&O x  3, follows commands, PERRLA HEENT: supple, no JVD Cardiovascular: sinus tach, no M/R/G.  Lungs: clear throughout, respirations even and unlabored Abdomen: BS x 4, soft, NT/ND.  Musculoskeletal: No gross deformities, no edema  Skin: Intact, warm, no rashes   Vitals:   09/19/16 0930 09/19/16 1030 09/19/16 1130 09/19/16 1200  BP:      Pulse: (!) 110     Resp: 14 (!) 22 18 19   Temp:      TempSrc:      SpO2: 100%     Weight:      Height:         Discharge Labs  BMET  Recent Labs Lab 09/17/16 1644 09/18/16 0638  NA 137 139  K 3.0* 3.7  CL 102 108  CO2 23 24  GLUCOSE 107* 134*  BUN 14 12  CREATININE 1.05 1.06  CALCIUM 9.5 8.7*  MG  --  2.0  PHOS  --  4.8*    CBC  Recent Labs Lab 09/17/16 1644 09/18/16 0638  HGB 15.6 13.9  HCT 45.1 40.3  WBC 20.4* 11.2*  PLT 221 196    Anti-Coagulation No results for input(s): INR in the last 168 hours.     Follow-up Information    PCP Follow up in 2 week(s).        Dr. Jacobo Forest Follow up today.   Why:  Contact Dr. Donivan Scull office at [phone] if you need further assistance            Allergies as of 09/19/2016   No Known Allergies     Medication List    TAKE these medications   metoprolol tartrate 25 MG tablet Commonly known as:  LOPRESSOR Take 1 tablet (25 mg total) by mouth 2 (two) times daily as needed (for heart rate greater than 110). Notes to patient:  DO NOT TAKE if systolic blood pressure less than 110        Disposition: Home   Discharged Condition: [place] has met maximum benefit of inpatient care and is medically stable and cleared for discharge.  Patient is pending follow up as above.     Marda Stalker, [place] Pager [phone] (please enter 7 digits) PCCM Consult Pager [phone] (please enter 7 digits)

## 2016-09-19 NOTE — Progress Notes (Signed)
He is back to baseline. However, has persistent sinus tachycardia. He has noted this previously. He admits to taking 4 pills of Benadryl which is the likely etiology of his altered mental status. Diphenhydramine can cross react on the UDS with TCAs. He has no access to try cyclic antidepressants and this was probably a false positive UDS.  I have reversed his IVC  After EKG, echocardiogram and cardiology consultation, he may be discharged home.  Billy Fischeravid Natsumi Whitsitt, MD PCCM service Mobile [phone] Pager [phone] 09/19/2016 1:03 PM   Addendum: Echocardiogram normal. EKG notable only for sinus tachycardia. Cardiology consultation performed. Impression is that the tachycardia is due to anticholinergic effect of diphenhydramine.  Billy Fischeravid Rashida Ladouceur, MD PCCM service Mobile [phone] Pager [phone] 09/19/2016 1:03 PM

## 2016-09-19 NOTE — Progress Notes (Signed)
[facility] - [place] [facility]   PATIENT NAME: Allen Wilson    MR#:  [id number]  DATE OF BIRTH:  27-Nov-1995  SUBJECTIVE:   Off precedex drip now. Back to baseline. Doing much better. Father and friend in the room. No complaints  REVIEW OF SYSTEMS:    Review of Systems  Constitutional: Negative for chills, fever and weight loss.  HENT: Negative for ear discharge, ear pain and nosebleeds.   Eyes: Negative for blurred vision, pain and discharge.  Respiratory: Negative for sputum production, shortness of breath, wheezing and stridor.   Cardiovascular: Negative for chest pain, palpitations, orthopnea and PND.  Gastrointestinal: Negative for abdominal pain, diarrhea, nausea and vomiting.  Genitourinary: Negative for frequency and urgency.  Musculoskeletal: Negative for back pain and joint pain.  Neurological: Negative for sensory change, speech change, focal weakness and weakness.  Psychiatric/Behavioral: Negative for depression and hallucinations. The patient is not nervous/anxious.     DRUG ALLERGIES:  No Known Allergies  VITALS:  Blood pressure 138/79, pulse (!) 110, temperature 98.4 F (36.9 C), temperature source Oral, resp. rate 19, height 6\' 1"  (1.854 m), weight 92.3 kg (203 lb 7.8 oz), SpO2 100 %.  PHYSICAL EXAMINATION:   Physical Exam  GENERAL:  21 y.o.-year-old patient lying in the bed with no acute distress.  EYES: Pupils equal, round, reactive to light and accommodation. No scleral icterus. Extraocular muscles intact.  HEENT: Head atraumatic, normocephalic. Oropharynx and nasopharynx clear.  NECK:  Supple, no jugular venous distention. No thyroid enlargement, no tenderness.  LUNGS: Normal breath sounds bilaterally, no wheezing, rales, rhonchi. No use of accessory muscles of respiration.  CARDIOVASCULAR: S1, S2 normal. No murmurs, rubs, or gallops.  ABDOMEN: Soft, nontender, nondistended. Bowel sounds present. No organomegaly or mass.   EXTREMITIES: No cyanosis, clubbing or edema b/l.    NEUROLOGIC: Cranial nerves II through XII are intact. No focal Motor or sensory deficits b/l.   PSYCHIATRIC: The patient is drowzy SKIN: No obvious rash, lesion, or ulcer.   LABORATORY PANEL:   CBC  Recent Labs Lab 09/18/16 0638  WBC 11.2*  HGB 13.9  HCT 40.3  PLT 196   ------------------------------------------------------------------------------------------------------------------ Chemistries   Recent Labs Lab 09/17/16 1644 09/18/16 0638  NA 137 139  K 3.0* 3.7  CL 102 108  CO2 23 24  GLUCOSE 107* 134*  BUN 14 12  CREATININE 1.05 1.06  CALCIUM 9.5 8.7*  MG  --  2.0  AST 26  --   ALT 21  --   ALKPHOS 69  --   BILITOT 0.9  --    ------------------------------------------------------------------------------------------------------------------  Cardiac Enzymes  Recent Labs Lab 09/17/16 1644  TROPONINI <0.03   ------------------------------------------------------------------------------------------------------------------  RADIOLOGY:  Ct Head Wo Contrast  Result Date: 09/17/2016 CLINICAL DATA:  Altered mental status. EXAM: CT HEAD WITHOUT CONTRAST TECHNIQUE: Contiguous axial images were obtained from the base of the skull through the vertex without intravenous contrast. COMPARISON:  None. FINDINGS: Brain: No evidence of acute infarction, hemorrhage, hydrocephalus, extra-axial collection or mass lesion/mass effect. Vascular: No hyperdense vessel or unexpected calcification. Skull: Normal. Negative for fracture or focal lesion. Sinuses/Orbits: The bilateral paranasal sinuses and mastoid air cells are clear. The orbits are unremarkable. Other: None. IMPRESSION: 1. Normal noncontrast CT of the brain. Electronically Signed   By: Obie DredgeWilliam T Derry M.D.   On: 09/17/2016 17:38     ASSESSMENT AND PLAN:   * Acute toxic encephalopathy Etiology unclear but overdose suspected. Pt reports taking 25mg  x 4 tabs of  benadryl  and drinking caffeine (coffee) on the day of admiison due to URI symptoms Today is doing better and back to baseline Tele HR 85-90 asymptomatic Off precedex  * Leucocytosis No source of infection Off abx  Overall stable Pt going home.  All the records are reviewed and case discussed with Care Management/Social Workerr. Management plans discussed with the patient, family and they are in agreement.  CODE STATUS: FULL  DVT Prophylaxis: SCDs  TOTAL TIME TAKING CARE OF THIS PATIENT: 35 minutes.   POSSIBLE D/C IN 1-2 DAYS, DEPENDING ON CLINICAL CONDITION.  Kristine Tiley M.D on 09/19/2016 at 1:42 PM  Between 7am to 6pm - Pager - [phone]  After 6pm go to [email or web address] - password [email or web address]  [facility] [place] [facility]  Office  [phone]  CC: Primary care physician; Patient, No Pcp Per  Note: This dictation was prepared with Dragon dictation along with smaller phrase technology. Any transcriptional errors that result from this process are unintentional.

## 2016-09-19 NOTE — Consult Note (Signed)
Cardiology Consult    Patient ID: Timmothy Eulerrenton P Yun MRN: [id number], DOB/AGE: 1995/12/28   Admit date: 09/17/2016 Date of Consult: 09/19/2016  Primary Physician: Patient, No Pcp Per Primary Cardiologist: new - seen by Concha Se. Gollan, MD  Requesting Provider: Kathie RhodesS. Allena KatzPatel, MD  Patient Profile    Allen Wilson is a 21 y.o. male without prior cardiac/medical history, who is being seen today for the evaluation of sinus tachycardia at the request of Dr. Allena KatzPatel.  Past Medical History   History reviewed. No pertinent past medical history.  No past surgical history on file.   Allergies  No Known Allergies  History of Present Illness    21 y/o ? without prior cardiac history.  He lives locally with his family and is currently working full-time as an Museum/gallery exhibitions officerMT.  He is not currently exercising but previously ran cross-country and played basketball in high school.  He has not prior h/o chest pain, DOE, palpitations, pre-syncope, or syncope.  Patient was in his usual state of health until the afternoon/evening of July 31, when he began to the chest and sinus congestion. On the morning of August 1, he continued to have chest and sinus congestion with cough. He was also experiencing general malaise and therefore left work after about 4 hours. He returned home, he drinks Gatorade helping to loosen up his chest congestion but then began coughing and had vomiting. Per he and his mother, ever since childhood, he would develop vomiting in the setting of chest congestion and cough. After this episode, he took for 25 mg Benadryl tablets in an effort to relieve his sinus congestion. He was not aware of what the appropriate dose was. He then went into his bedroom and fell asleep. Later in the day, his girlfriend went to wake him up and noted that he was very groggy. He was also very unsteady on his feet. After seeing this, his mother called EMS. He was taken to [facility] department, where he was tachycardic and  somewhat agitated with hallucinations. Urine drug screen was positive for tricyclic antidepressants. Patient was admitted to the intensive care unit and placed on precedex.  Over the course of any August 2, he had elevated heart rates with frequent runs of sinus tachycardia into the 130s. By late afternoon, he was more alert mental status cleared. This morning, patient is asymptomatic. Heart rates remained in the 80s to 1 teens at times. We have asked to evaluate echocardiogram preliminarily shows normal LV function.  Inpatient Medications    . enoxaparin (LOVENOX) injection  40 mg Subcutaneous Q24H    Family History    Family History  Problem Relation Age of Onset  . Other Mother        alive & well.  . Hypertension Father   . Heart attack Maternal Grandfather        x 2 - first in his 6040's.    Social History    Social History   Social History  . Marital status: Single    Spouse name: N/A  . Number of children: N/A  . Years of education: N/A   Occupational History  . EMT    Social History Main Topics  . Smoking status: Never Smoker  . Smokeless tobacco: Never Used  . Alcohol use No  . Drug use: No  . Sexual activity: Not on file   Other Topics Concern  . Not on file   Social History Narrative   Works full time as Museum/gallery exhibitions officerMT.  Lives with mother.  Not currently routinely exercising but used to run cross country and play basketball in [employer].     Review of Systems    General:  No chills, fever, night sweats or weight changes.  Cardiovascular:  No chest pain, dyspnea on exertion, edema, orthopnea, palpitations, paroxysmal nocturnal dyspnea. Dermatological: No rash, lesions/masses Respiratory: No cough, dyspnea Urologic: No hematuria, dysuria Abdominal:   No nausea, vomiting, diarrhea, bright red blood per rectum, melena, or hematemesis Neurologic:  No visual changes, wkns, +++ changes in mental status. All other systems reviewed and are otherwise negative except as  noted above.  Physical Exam    Blood pressure 138/79, pulse 85, temperature 98.6 F (37 C), temperature source Oral, resp. rate 19, height 6\' 1"  (1.854 m), weight 203 lb 7.8 oz (92.3 kg), SpO2 99 %.  General: Pleasant, NAD Psych: Normal affect. Neuro: Alert and oriented X 3. Moves all extremities spontaneously. HEENT: Normal  Neck: Supple without bruits or JVD. Lungs:  Resp regular and unlabored, CTA. Heart: RRR no s3, s4, or murmurs. Abdomen: Soft, non-tender, non-distended, BS + x 4.  Extremities: No clubbing, cyanosis or edema. DP/PT/Radials 2+ and equal bilaterally.  Labs    Recent Labs  09/17/16 1644  TROPONINI <0.03   Lab Results  Component Value Date   WBC 11.2 (H) 09/18/2016   HGB 13.9 09/18/2016   HCT 40.3 09/18/2016   MCV 85.7 09/18/2016   PLT 196 09/18/2016    Recent Labs Lab 09/17/16 1644 09/18/16 0638  NA 137 139  K 3.0* 3.7  CL 102 108  CO2 23 24  BUN 14 12  CREATININE 1.05 1.06  CALCIUM 9.5 8.7*  PROT 8.1  --   BILITOT 0.9  --   ALKPHOS 69  --   ALT 21  --   AST 26  --   GLUCOSE 107* 134*    Radiology Studies    Ct Head Wo Contrast  Result Date: 09/17/2016 CLINICAL DATA:  Altered mental status. EXAM: CT HEAD WITHOUT CONTRAST TECHNIQUE: Contiguous axial images were obtained from the base of the skull through the vertex without intravenous contrast. COMPARISON:  None. FINDINGS: Brain: No evidence of acute infarction, hemorrhage, hydrocephalus, extra-axial collection or mass lesion/mass effect. Vascular: No hyperdense vessel or unexpected calcification. Skull: Normal. Negative for fracture or focal lesion. Sinuses/Orbits: The bilateral paranasal sinuses and mastoid air cells are clear. The orbits are unremarkable. Other: None. IMPRESSION: 1. Normal noncontrast CT of the brain. Electronically Signed   By: Obie DredgeWilliam T Derry M.D.   On: 09/17/2016 17:38    ECG & Cardiac Imaging    Sinus tachycardia, 101, borderline biatrial enlargement.   Assessment &  Plan    1. Sinus tachycardia: Patient presented to the emergency department on August 1 with altered mental status in the setting of ingestion of 100 mg of Benadryl. Urine drug screen also positive for tricyclic antidepressants patient denies. He has had sinus tachycardia with variable rate throughout hospitalization. He has been asymptomatic. Review of telemetry does not show any evidence of SVT or atrial arrhythmias. Suspect sinus tachycardia driven by anticholinergic effects of Benadryl. Echocardiogram preliminarily shows normal LV function. In that setting, would not pursue any additional cardiac evaluation at this time. Also would not treat with AVN blocking agents.  Patient encouraged to increase by mouth intake/fluids. I will check a TSH for completeness.  2. Benadryl overdose: He presented with altered mental status however is now lucid.  Signed, Nicolasa Duckinghristopher Jonathon Castelo, NP 09/19/2016, 11:26 AM    ``

## 2016-09-19 NOTE — Progress Notes (Addendum)
Discharge instructions reviewed w/ patient, hard copy given; signed for receipt of same. He verbalized his understanding of instructions. Metoprolol prescription was sent to [facility]. Patient A&O x 4, VSS. Patient walked w/ his girlfriend to the hospital exit; refused wheelchair.  Time of d/c: 1323 hours this day.

## 2016-09-19 NOTE — Progress Notes (Signed)
*  PRELIMINARY RESULTS* Echocardiogram 2D Echocardiogram has been performed.  Cristela BlueHege, Charlett Merkle 09/19/2016, 11:08 AM

## 2016-09-22 LAB — CULTURE, BLOOD (ROUTINE X 2): Culture: NO GROWTH

## 2016-12-09 ENCOUNTER — Ambulatory Visit: Payer: Self-pay | Admitting: Physician Assistant

## 2016-12-09 ENCOUNTER — Encounter: Payer: Self-pay | Admitting: Physician Assistant

## 2016-12-09 VITALS — BP 120/80 | HR 92 | Temp 98.5°F | Resp 16

## 2016-12-09 DIAGNOSIS — R454 Irritability and anger: Secondary | ICD-10-CM

## 2016-12-09 NOTE — Progress Notes (Signed)
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

## 2017-01-03 ENCOUNTER — Other Ambulatory Visit: Payer: Self-pay

## 2017-01-03 ENCOUNTER — Encounter: Payer: Self-pay | Admitting: Emergency Medicine

## 2017-01-03 ENCOUNTER — Emergency Department
Admission: EM | Admit: 2017-01-03 | Discharge: 2017-01-03 | Disposition: A | Discharge status: Home / Self Care | Payer: Managed Care, Other (non HMO) | Attending: Emergency Medicine | Admitting: Emergency Medicine

## 2017-01-03 DIAGNOSIS — Y929 Unspecified place or not applicable: Secondary | ICD-10-CM | POA: Diagnosis not present

## 2017-01-03 DIAGNOSIS — Y998 Other external cause status: Secondary | ICD-10-CM | POA: Insufficient documentation

## 2017-01-03 DIAGNOSIS — S79922A Unspecified injury of left thigh, initial encounter: Secondary | ICD-10-CM | POA: Diagnosis present

## 2017-01-03 DIAGNOSIS — S81812A Laceration without foreign body, left lower leg, initial encounter: Secondary | ICD-10-CM

## 2017-01-03 DIAGNOSIS — W312XXA Contact with powered woodworking and forming machines, initial encounter: Secondary | ICD-10-CM | POA: Insufficient documentation

## 2017-01-03 DIAGNOSIS — Y9389 Activity, other specified: Secondary | ICD-10-CM | POA: Insufficient documentation

## 2017-01-03 DIAGNOSIS — S71112A Laceration without foreign body, left thigh, initial encounter: Secondary | ICD-10-CM | POA: Diagnosis not present

## 2017-01-03 HISTORY — DX: Tachycardia, unspecified: R00.0

## 2017-01-03 MED ORDER — LIDOCAINE HCL (PF) 1 % IJ SOLN
INTRAMUSCULAR | Status: AC
  Filled 2017-01-03: qty 5

## 2017-01-03 MED ORDER — SULFAMETHOXAZOLE-TRIMETHOPRIM 800-160 MG PO TABS: 1.0000 | ORAL_TABLET | Freq: Two times a day (BID) | ORAL | 0 refills | Status: DC

## 2017-01-03 MED ORDER — SULFAMETHOXAZOLE-TRIMETHOPRIM 800-160 MG PO TABS
1.0000 | ORAL_TABLET | Freq: Once | ORAL | Status: AC
  Administered 2017-01-03: 1 via ORAL
  Filled 2017-01-03: qty 1

## 2017-01-03 MED ORDER — TRAMADOL HCL 50 MG PO TABS: 50.0000 mg | ORAL_TABLET | Freq: Four times a day (QID) | ORAL | 0 refills | Status: DC | PRN

## 2017-01-03 MED ORDER — OXYCODONE-ACETAMINOPHEN 5-325 MG PO TABS
1.0000 | ORAL_TABLET | Freq: Once | ORAL | Status: AC
  Administered 2017-01-03: 1 via ORAL
  Filled 2017-01-03: qty 1

## 2017-01-03 NOTE — ED Notes (Signed)
First Nurse Note: Pt ambulatory to ED stating that he cut his leg with a chainsaw. Bleeding controlled at this time.

## 2017-01-03 NOTE — ED Provider Notes (Signed)
[facility] Emergency Department Provider Note   ____________________________________________   First MD Initiated Contact with Patient 01/03/17 1215     (approximate)  I have reviewed the triage vital signs and the nursing notes.   HISTORY  Chief Complaint Laceration    HPI Allen Wilson is a 21 y.o. male patient presented with laceration to left thigh secondary to a chain saw cut. Incident occurred prior to arrival. Patient state bleeding controlled direct pressure. Patient denies loss sensation or loss of function. Patient tetanus shot is up-to-date. He waswas tachycardic status a condition which is normally controlled with medication today is noncompliant with taking.Patient denies pain at this time.   Past Medical History:  Diagnosis Date  . Tachycardia     Patient Active Problem List   Diagnosis Date Noted  . Acute psychosis (HCC)   . Drug overdose 09/17/2016    History reviewed. No pertinent surgical history.  Prior to Admission medications   Medication Sig Start Date End Date Taking? Authorizing Provider  metoprolol tartrate (LOPRESSOR) 25 MG tablet Take 1 tablet (25 mg total) by mouth 2 (two) times daily as needed (for heart rate greater than 110). Patient not taking: Reported on 12/09/2016 09/19/16   Eugenie NorrieBlakeney, Dana G, NP  sulfamethoxazole-trimethoprim (BACTRIM DS,SEPTRA DS) 800-160 MG tablet Take 1 tablet 2 (two) times daily by mouth. 01/03/17   Joni ReiningSmith, Alecia Doi K, PA-C  traMADol (ULTRAM) 50 MG tablet Take 1 tablet (50 mg total) every 6 (six) hours as needed by mouth for moderate pain. 01/03/17   Joni ReiningSmith, Jemimah Cressy K, PA-C    Allergies Patient has no known allergies.  Family History  Problem Relation Age of Onset  . Other Mother        alive & well.  . Hypertension Father   . Heart attack Maternal Grandfather        x 2 - first in his 6940's.    Social History Social History   Tobacco Use  . Smoking status: Never Smoker  .  Smokeless tobacco: Current User    Types: Chew  Substance Use Topics  . Alcohol use: No  . Drug use: No    Review of Systems  Constitutional: No fever/chills Eyes: No visual changes. ENT: No sore throat. Cardiovascular: Denies chest pain. SVT Respiratory: Denies shortness of breath. Gastrointestinal: No abdominal pain.  No nausea, no vomiting.  No diarrhea.  No constipation. Genitourinary: Negative for dysuria. Musculoskeletal: Negative for back pain. Skin: Negative for rash. Laceration left thigh Neurological: Negative for headaches, focal weakness or numbness.   ____________________________________________   PHYSICAL EXAM:  VITAL SIGNS: ED Triage Vitals [01/03/17 1137]  Enc Vitals Group     BP (!) 143/62     Pulse Rate (!) 120     Resp 18     Temp 98.8 F (37.1 C)     Temp Source Oral     SpO2 96 %     Weight 205 lb (93 kg)     Height 6\' 1"  (1.854 m)     Head Circumference      Peak Flow      Pain Score 0     Pain Loc      Pain Edu?      Excl. in GC?     Constitutional: Alert and oriented. Well appearing and in no acute distress. Cardiovascular: Tachycardic secondary to underlying medical condition. Grossly normal heart sounds.  Good peripheral circulation. Respiratory: Normal respiratory effort.  No retractions. Lungs CTAB.  Gastrointestinal: Soft and nontender. No distention. No abdominal bruits. No CVA tenderness. Musculoskeletal: No lower extremity tenderness nor edema.  No joint effusions. Neurologic:  Normal speech and language. No gross focal neurologic deficits are appreciated. No gait instability. Skin:  Skin is warm, dry and intact. No rash noted. 3 cm laceration left thigh Psychiatric: Mood and affect are normal. Speech and behavior are normal.  ____________________________________________   LABS (all labs ordered are listed, but only abnormal results are displayed)  Labs Reviewed - No data to  display ____________________________________________  EKG   ____________________________________________  RADIOLOGY  No results found.  ____________________________________________   PROCEDURES  Procedure(s) performed:   Marland Kitchen.Marland Kitchen.Laceration Repair Date/Time: 01/03/2017 1:12 PM Performed by: Joni ReiningSmith, Sanii Kukla K, PA-C Authorized by: Joni ReiningSmith, Zaid Tomes K, PA-C   Consent:    Consent obtained:  Verbal   Consent given by:  Patient   Risks discussed:  Infection Anesthesia (see MAR for exact dosages):    Anesthesia method:  Local infiltration   Local anesthetic:  Lidocaine 1% w/o epi Laceration details:    Location:  Leg   Leg location:  L upper leg   Length (cm):  3 Repair type:    Repair type:  Simple Pre-procedure details:    Preparation:  Patient was prepped and draped in usual sterile fashion Exploration:    Hemostasis achieved with:  Direct pressure   Wound extent: no foreign bodies/material noted, no muscle damage noted, no nerve damage noted, no tendon damage noted, no underlying fracture noted and no vascular damage noted     Contaminated: yes   Treatment:    Area cleansed with:  Betadine   Amount of cleaning:  Extensive   Irrigation solution:  Sterile saline   Irrigation volume:  400 cc   Irrigation method:  Syringe Skin repair:    Repair method:  Sutures   Suture size:  3-0   Suture material:  Nylon   Suture technique:  Simple interrupted   Number of sutures:  10 Approximation:    Approximation:  Close Post-procedure details:    Dressing:  Sterile dressing   Patient tolerance of procedure:  Tolerated well, no immediate complications    Critical Care performed:   ____________________________________________   INITIAL IMPRESSION / ASSESSMENT AND PLAN / ED COURSE  As part of my medical decision making, I reviewed the following data within the electronic MEDICAL RECORD NUMBER   Left leg laceration secondary to a chain saw cut. Wound was cleaned and repaired. Patient  given discharge Instructions. Patient advised take medications directed return in 10 days for suture removal.      ____________________________________________   FINAL CLINICAL IMPRESSION(S) / ED DIAGNOSES  Final diagnoses:  Laceration of left leg, initial encounter     ED Discharge Orders        Ordered    sulfamethoxazole-trimethoprim (BACTRIM DS,SEPTRA DS) 800-160 MG tablet  2 times daily     01/03/17 1307    traMADol (ULTRAM) 50 MG tablet  Every 6 hours PRN     01/03/17 1307       Note:  This document was prepared using Dragon voice recognition software and may include unintentional dictation errors.    Joni ReiningSmith, Minola Guin K, PA-C 01/03/17 1318    Governor RooksLord, Rebecca, MD 01/03/17 [phone]

## 2017-01-03 NOTE — ED Triage Notes (Addendum)
Pt working with chainsaw today and cut left upper thigh, bleeding controlled at this time.  Last tetanus shot unknown.   PT has hx of tachycardia and is supposed to take medication for it but doesnt

## 2017-01-14 ENCOUNTER — Ambulatory Visit: Payer: Self-pay | Admitting: Physician Assistant

## 2017-02-18 ENCOUNTER — Encounter: Payer: Self-pay | Admitting: Emergency Medicine

## 2017-02-18 ENCOUNTER — Ambulatory Visit: Payer: Self-pay | Admitting: Emergency Medicine

## 2017-02-18 VITALS — BP 110/70 | HR 108 | Temp 97.7°F | Resp 16

## 2017-02-18 DIAGNOSIS — R059 Cough, unspecified: Secondary | ICD-10-CM

## 2017-02-18 DIAGNOSIS — R05 Cough: Secondary | ICD-10-CM

## 2017-02-18 DIAGNOSIS — H6593 Unspecified nonsuppurative otitis media, bilateral: Secondary | ICD-10-CM

## 2017-02-18 MED ORDER — BENZONATATE 100 MG PO CAPS: 100.0000 mg | ORAL_CAPSULE | Freq: Three times a day (TID) | ORAL | 0 refills | Status: DC | PRN

## 2017-02-18 MED ORDER — AMOXICILLIN-POT CLAVULANATE 875-125 MG PO TABS: 1.0000 | ORAL_TABLET | Freq: Two times a day (BID) | ORAL | 0 refills | Status: DC

## 2017-02-18 NOTE — Progress Notes (Signed)
Subjective Patient began feeling ill yesterday with significant sinus congestion and sinus pain and bilateral ear pain. He has a history of recurrent ear infections since he was young. He complains of bilateral ear pain today consistent with what he has had in the past with his ear infections. He has a mild cough but this is not severe. He has not had a fever. He is currently on Mucinex D and Tylenol. Review of systems. Last year patient was hospitalized after mixing Benadryl and energy drinks. Apparently he developed an acute psychosis related to these medications. He has been instructed to avoid them since that time. Objective. Patient is alert and cooperative in no distress. TMs are dull with some scarring. Nose is congested. Throat is red no exudate. Neck supple without adenopathy. Chest clear to auscultation. Assessment. Patient presents with a respiratory illness probably viral with history of recurrent otitis media with his infections. He has a history of sensitivity to any type of stimulant decongestant. He has met a prolonged take when his heart rate gets greater than 108 and he has been taking Mucinex D. Plan Patient advised not to take anything with a decongestant in it. Augmentin 875 twice a day #14. Tessalon Perles for cough. Tylenol for symptoms. 

## 2017-05-06 ENCOUNTER — Ambulatory Visit: Payer: Self-pay | Admitting: Family Medicine

## 2017-05-06 VITALS — BP 142/92 | Ht 73.0 in | Wt 220.0 lb

## 2017-05-06 DIAGNOSIS — Z0000 Encounter for general adult medical examination without abnormal findings: Secondary | ICD-10-CM

## 2017-05-06 NOTE — Progress Notes (Signed)
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  Patient currently works for EMS.  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  Denies any symptoms or complaints.  Reports his only medical history is that he was hospitalized last year due to taking Benadryl while he was sick combined with drinking a Public libr[employer].  He reports that this resulted in a high resting heart rate, which she was prescribed metoprolol for.  Patient is not currently taking this.  Patient denies any cardiac symptoms.  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  Form supplied by patient does not request any additional testing other than blood pressure, height, weight and Snellen testing.  His are normal today with the exception of his blood pressure, which is slightly elevated.  Patient reports drinking a [facility] before coming here.  Blood pressure is 142/92.  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  Patient does not currently have a primary care provider. Patient denies any other issues or concerns.   Review of Systems Constitutional: Unremarkable.  HEENT: Denies dizziness, issues with hearing, vision problems.  Gastrointestinal: Denies issues with bowel or bladder.  Respiratory: Unremarkable.   Cardiovascular: Unremarkable.  Musculoskeletal: No significant arthralgias, myalgias, joint swelling, joint stiffness, back pain, neck pain. ROS otherwise negative.   Objective  Physical Exam General: Awake, alert and oriented. No acute distress. Well developed, hydrated and nourished. Appears stated age.  HEENT:  Supple neck without  adenopathy. Sclera is non-icteric. The ear canal is clear without discharge. The tympanic membrane is normal in appearance with normal landmarks and cone of light. Nasal mucosa is pink and moist. Oral mucosa is pink and moist. The pharynx is normal in appearance without tonsillar swelling or exudates.  Skin: Skin in warm, dry and intact without rashes or lesions. Appropriate color for ethnicity. Cardiac: Heart rate and rhythm are normal. No murmurs, gallops, or rubs are auscultated.  Respiratory: The chest wall is symmetric and without deformity. No signs of respiratory distress. Lung sounds are clear in all lobes bilaterally without rales, ronchi, or wheezes.  Abdominal: Abdomen is soft, symmetric, and non-tender without distention. No masses, hepatomegaly, or splenomegaly are noted.  Spine: Neck and back are without deformity. Curvature of the cervical, thoracic, and lumbar spine are within normal limits. Posture is upright, gait is smooth, steady, and within normal limits. No tenderness noted on palpation of the spinous processes. Spinous processes are midline. No discomfort is noted with flexion, extension, and side-to-side rotation of the cervical/thoracic/lumbar spine, full range of motion is noted. Grip strength is normal bilaterally.  Extremities: Full range of motion is noted to all major joints. Steady gait noted.  Neurological: The patient is awake, alert and oriented to person, place, and time with normal speech.  Memory is normal and thought processes intact. No gait abnormalities are appreciated.  Psychiatric: Appropriate mood and affect.   Assessment Routine physical  Plan  Encouraged routine visits with primary care provider and provided patient with resources to establish care.  Informed him to follow-up with his primary care provider regarding his elevated blood pressure and to monitor this at home regularly.  Patient educated  regarding normal values, red flag symptoms, and  indications to seek medical care.

## 2018-01-05 IMAGING — CT CT HEAD W/O CM
3 of 4 series · 15 of 47 positions shown, 18 images · non-contrast
Comparison: None.

CLINICAL DATA: Altered mental status.

EXAM:
CT HEAD WITHOUT CONTRAST
TECHNIQUE: Contiguous axial images were obtained from the base of the skull
through the vertex without intravenous contrast.

[Series 2: head wo · axial · 0.40mm/px · z∈[-136,-11]mm · 9 of 29 slices shown, 12 images]
[im 2/29  brain]
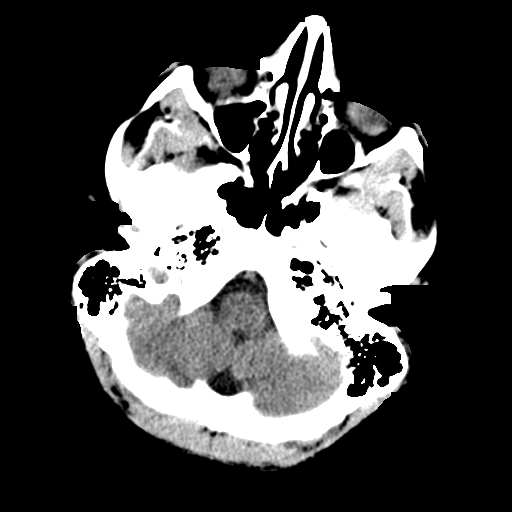
[im 2/29  bone]
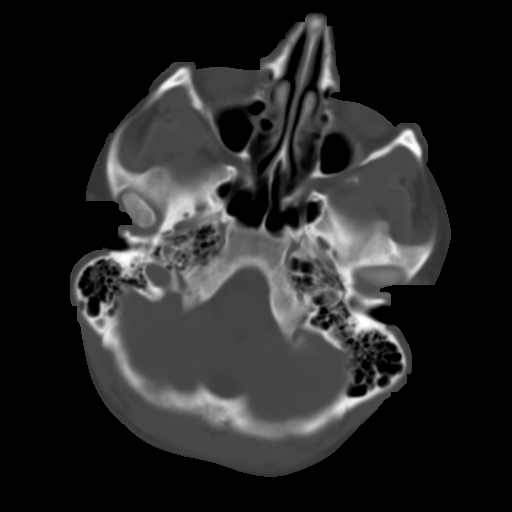
[im 6/29  brain]
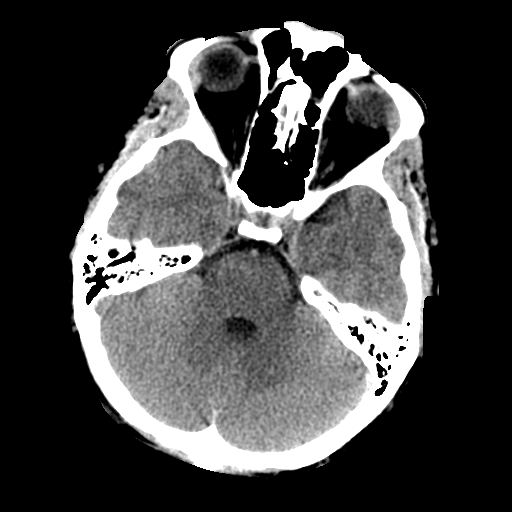
[im 9/29  brain]
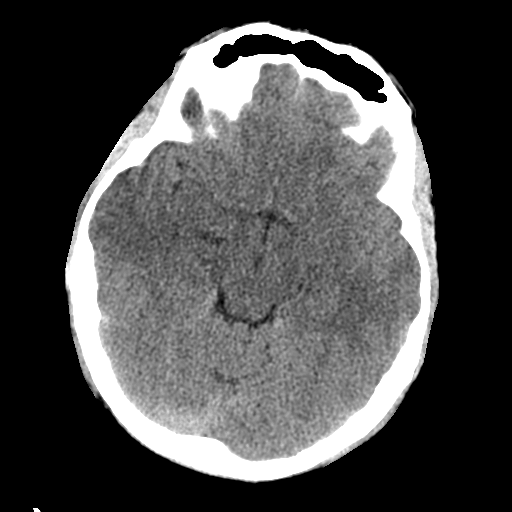
[im 11/29  brain]
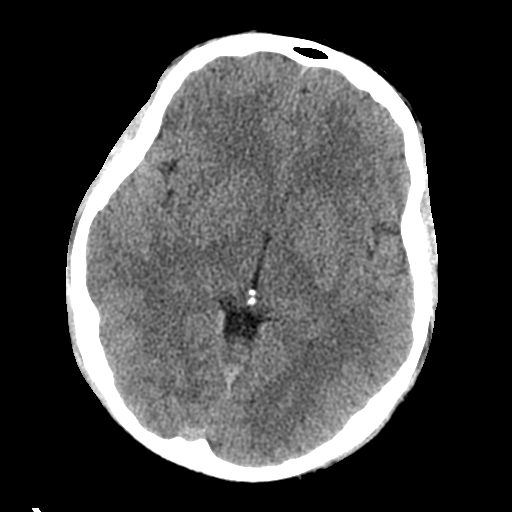
[im 15/29  brain]
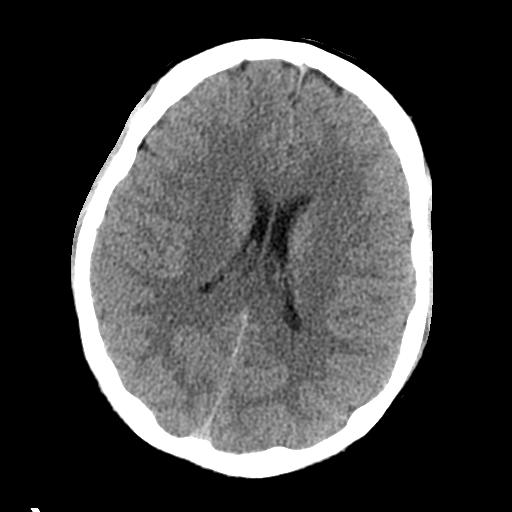
[im 15/29  bone]
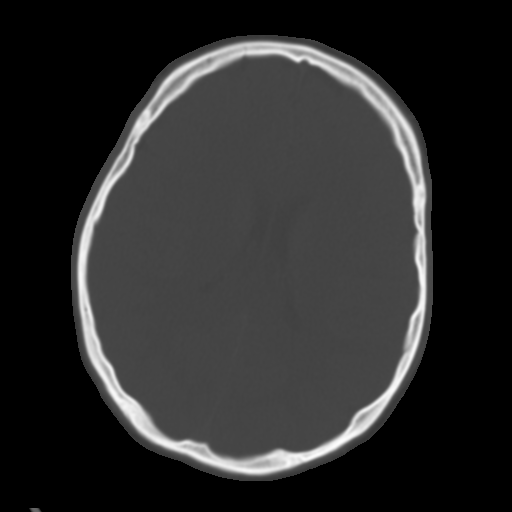
[im 18/29  brain]
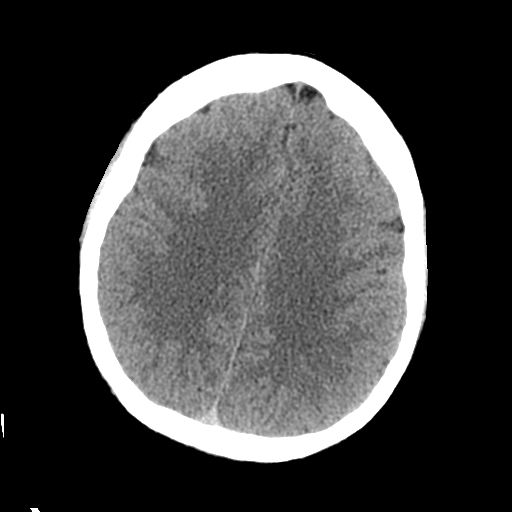
[im 20/29  brain]
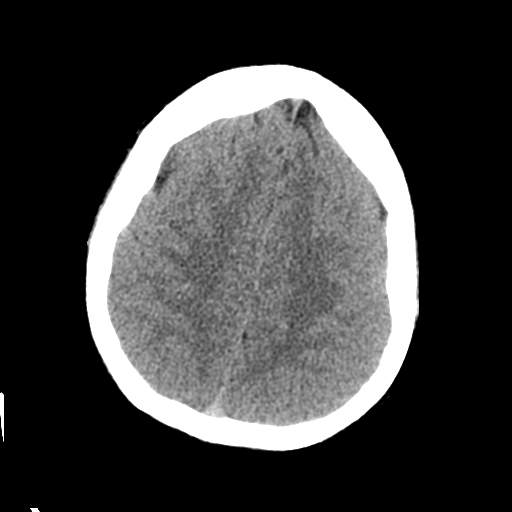
[im 23/29  brain]
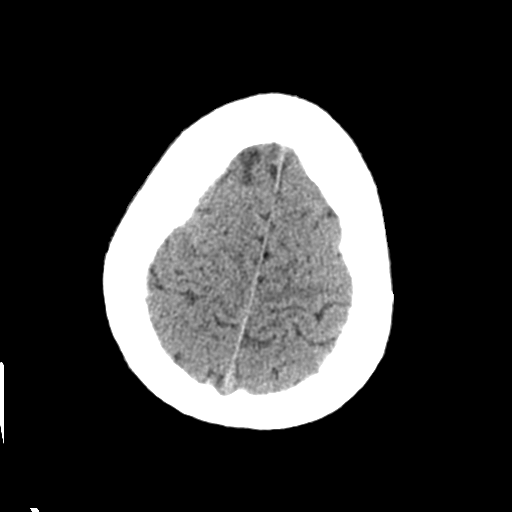
[im 27/29  brain]
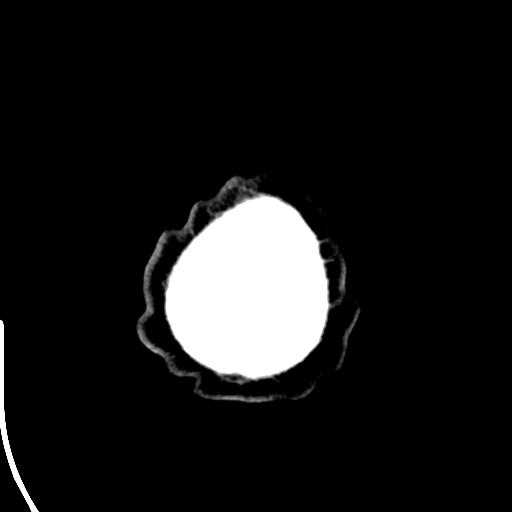
[im 27/29  bone]
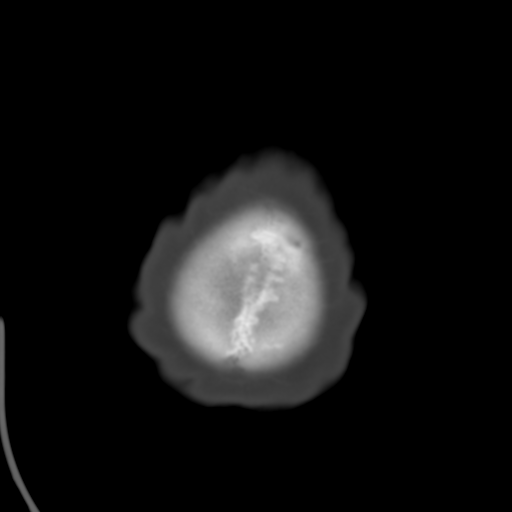

[Series 4: coronal soft tissue · coronal · 0.31mm/px · 3 of 67 slices shown]
[im 23/67  brain]
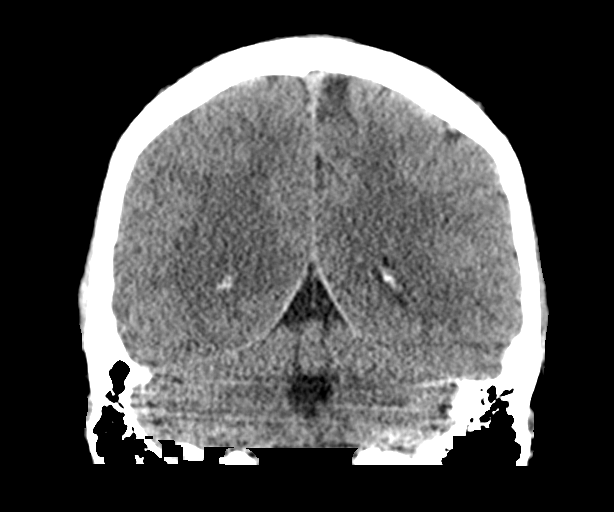
[im 30/67  brain]
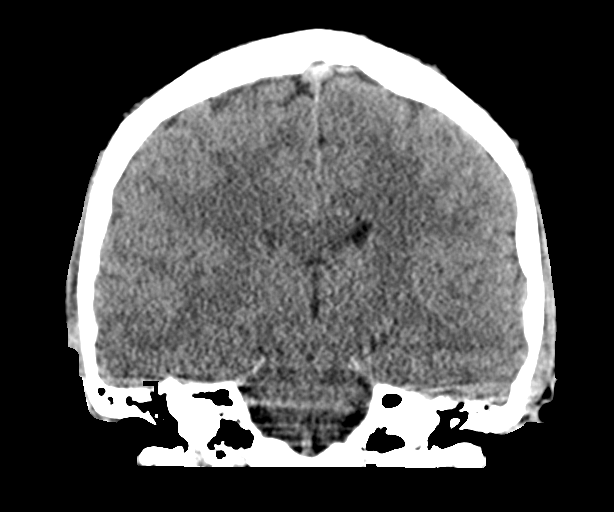
[im 37/67  brain]
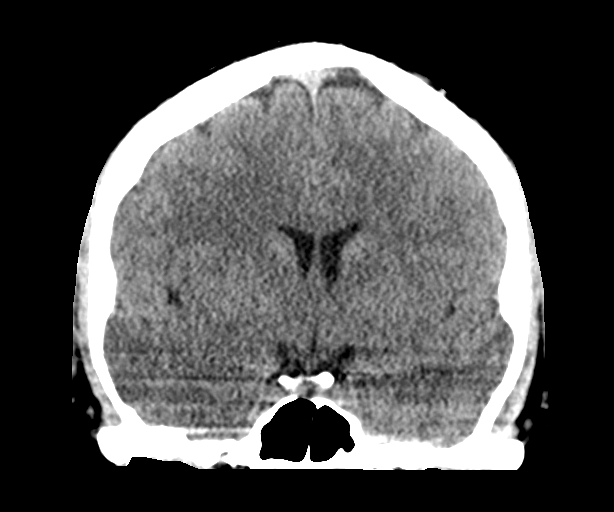

[Series 5: sagittal soft tissue · sagittal · 0.32mm/px · 3 of 57 slices shown]
[im 19/57  brain]
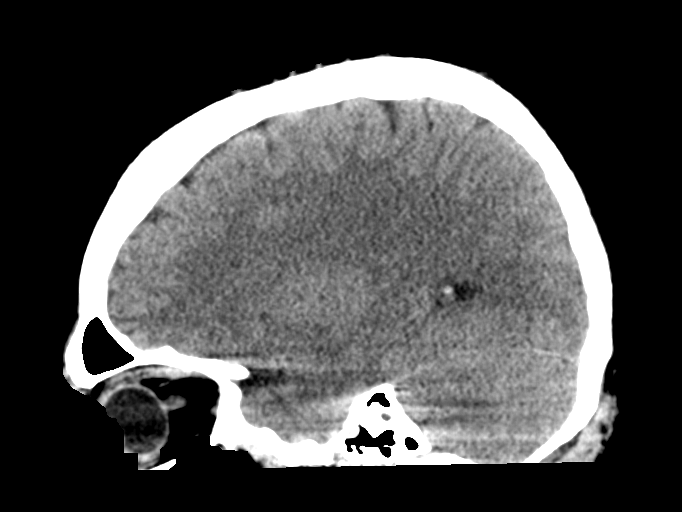
[im 29/57  brain]
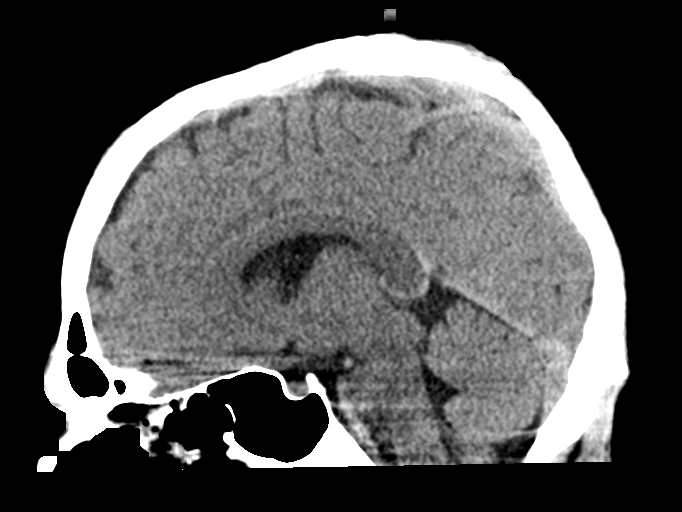
[im 38/57  brain]
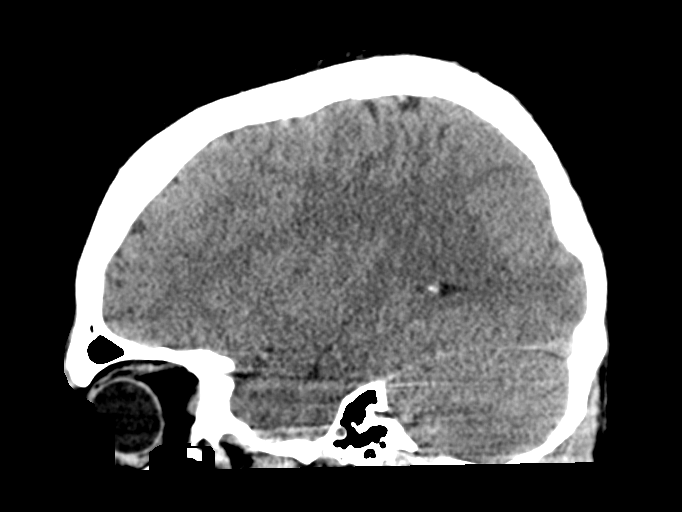

[15 of 47 positions shown; findings below may reference images not displayed]

FINDINGS: Brain: No evidence of acute infarction, hemorrhage, hydrocephalus,
extra-axial collection or mass lesion/mass effect.

Vascular: No hyperdense vessel or unexpected calcification.

Skull: Normal. Negative for fracture or focal lesion.

Sinuses/Orbits: The bilateral paranasal sinuses and mastoid air
cells are clear. The orbits are unremarkable.

Other: None.
IMPRESSION: 1. Normal noncontrast CT of the brain.

## 2018-02-15 ENCOUNTER — Emergency Department: Payer: BLUE CROSS/BLUE SHIELD

## 2018-02-15 ENCOUNTER — Other Ambulatory Visit: Payer: Self-pay

## 2018-02-15 ENCOUNTER — Inpatient Hospital Stay
Admission: EM | Admit: 2018-02-15 | Discharge: 2018-02-16 | DRG: 918 | Disposition: A | Discharge status: Other Acute Inpatient Hospital | Payer: BLUE CROSS/BLUE SHIELD | Attending: Internal Medicine | Admitting: Internal Medicine

## 2018-02-15 DIAGNOSIS — F23 Brief psychotic disorder: Secondary | ICD-10-CM | POA: Diagnosis present

## 2018-02-15 DIAGNOSIS — R402143 Coma scale, eyes open, spontaneous, at hospital admission: Secondary | ICD-10-CM | POA: Diagnosis present

## 2018-02-15 DIAGNOSIS — Z79899 Other long term (current) drug therapy: Secondary | ICD-10-CM

## 2018-02-15 DIAGNOSIS — E876 Hypokalemia: Secondary | ICD-10-CM | POA: Diagnosis present

## 2018-02-15 DIAGNOSIS — R402353 Coma scale, best motor response, localizes pain, at hospital admission: Secondary | ICD-10-CM | POA: Diagnosis present

## 2018-02-15 DIAGNOSIS — T443X2A Poisoning by other parasympatholytics [anticholinergics and antimuscarinics] and spasmolytics, intentional self-harm, initial encounter: Secondary | ICD-10-CM | POA: Diagnosis present

## 2018-02-15 DIAGNOSIS — E860 Dehydration: Secondary | ICD-10-CM | POA: Diagnosis present

## 2018-02-15 DIAGNOSIS — T50901A Poisoning by unspecified drugs, medicaments and biological substances, accidental (unintentional), initial encounter: Secondary | ICD-10-CM

## 2018-02-15 DIAGNOSIS — G40409 Other generalized epilepsy and epileptic syndromes, not intractable, without status epilepticus: Secondary | ICD-10-CM | POA: Diagnosis present

## 2018-02-15 DIAGNOSIS — Z720 Tobacco use: Secondary | ICD-10-CM | POA: Diagnosis not present

## 2018-02-15 DIAGNOSIS — R000 Tachycardia, unspecified: Secondary | ICD-10-CM | POA: Diagnosis present

## 2018-02-15 DIAGNOSIS — R9431 Abnormal electrocardiogram [ECG] [EKG]: Secondary | ICD-10-CM | POA: Diagnosis not present

## 2018-02-15 DIAGNOSIS — T43222A Poisoning by selective serotonin reuptake inhibitors, intentional self-harm, initial encounter: Secondary | ICD-10-CM | POA: Diagnosis not present

## 2018-02-15 DIAGNOSIS — T450X2A Poisoning by antiallergic and antiemetic drugs, intentional self-harm, initial encounter: Secondary | ICD-10-CM | POA: Diagnosis present

## 2018-02-15 DIAGNOSIS — N179 Acute kidney failure, unspecified: Secondary | ICD-10-CM | POA: Diagnosis present

## 2018-02-15 DIAGNOSIS — R402233 Coma scale, best verbal response, inappropriate words, at hospital admission: Secondary | ICD-10-CM | POA: Diagnosis present

## 2018-02-15 DIAGNOSIS — F322 Major depressive disorder, single episode, severe without psychotic features: Secondary | ICD-10-CM

## 2018-02-15 LAB — COMPREHENSIVE METABOLIC PANEL
ALT: 32 U/L (ref 0–44)
AST: 42 U/L — AB (ref 15–41)
Albumin: 5.9 g/dL — ABNORMAL HIGH (ref 3.5–5.0)
Alkaline Phosphatase: 80 U/L (ref 38–126)
Anion gap: 28 — ABNORMAL HIGH (ref 5–15)
BUN: 17 mg/dL (ref 6–20)
CO2: 11 mmol/L — AB (ref 22–32)
Calcium: 10.3 mg/dL (ref 8.9–10.3)
Chloride: 103 mmol/L (ref 98–111)
Creatinine, Ser: 1.27 mg/dL — ABNORMAL HIGH (ref 0.61–1.24)
GFR calc Af Amer: >60 mL/min (ref >60)
GFR calc non Af Amer: >60 mL/min (ref >60)
Glucose, Bld: 103 mg/dL — ABNORMAL HIGH (ref 70–99)
Potassium: 3 mmol/L — ABNORMAL LOW (ref 3.5–5.1)
SODIUM: 142 mmol/L (ref 135–145)
Total Bilirubin: 0.8 mg/dL (ref 0.3–1.2)
Total Protein: 9.9 g/dL — ABNORMAL HIGH (ref 6.5–8.1)

## 2018-02-15 LAB — CBC WITH DIFFERENTIAL/PLATELET
Abs Immature Granulocytes: 0.07 10*3/uL (ref 0.00–0.07)
BASOS ABS: 0.1 10*3/uL (ref 0.0–0.1)
Basophils Relative: 1 %
Eosinophils Absolute: 0 10*3/uL (ref 0.0–0.5)
Eosinophils Relative: 0 %
HCT: 55.7 % — ABNORMAL HIGH (ref 39.0–52.0)
Hemoglobin: 17.7 g/dL — ABNORMAL HIGH (ref 13.0–17.0)
Immature Granulocytes: 0 %
Lymphocytes Relative: 33 %
Lymphs Abs: 5.4 10*3/uL — ABNORMAL HIGH (ref 0.7–4.0)
MCH: 29.1 pg (ref 26.0–34.0)
MCHC: 31.8 g/dL (ref 30.0–36.0)
MCV: 91.5 fL (ref 80.0–100.0)
Monocytes Absolute: 1.2 10*3/uL — ABNORMAL HIGH (ref 0.1–1.0)
Monocytes Relative: 7 %
Neutro Abs: 9.8 10*3/uL — ABNORMAL HIGH (ref 1.7–7.7)
Neutrophils Relative %: 59 %
Platelets: 313 10*3/uL (ref 150–400)
RBC: 6.09 MIL/uL — ABNORMAL HIGH (ref 4.22–5.81)
RDW: 11.6 % (ref 11.5–15.5)
WBC: 16.5 10*3/uL — ABNORMAL HIGH (ref 4.0–10.5)
nRBC: 0 % (ref 0.0–0.2)

## 2018-02-15 LAB — BLOOD GAS, VENOUS
Acid-base deficit: 10.1 mmol/L — ABNORMAL HIGH (ref 0.0–2.0)
Bicarbonate: 16.7 mmol/L — ABNORMAL LOW (ref 20.0–28.0)
FIO2: 1
O2 Saturation: 99.5 %
Patient temperature: 37
pCO2, Ven: 39 mmHg — ABNORMAL LOW (ref 44.0–60.0)
pH, Ven: 7.24 — ABNORMAL LOW (ref 7.250–7.430)
pO2, Ven: 189 mmHg — ABNORMAL HIGH (ref 32.0–45.0)

## 2018-02-15 LAB — ACETAMINOPHEN LEVEL
Acetaminophen (Tylenol), Serum: <10 ug/mL — ABNORMAL LOW (ref 10–30)
Acetaminophen (Tylenol), Serum: <10 ug/mL — ABNORMAL LOW (ref 10–30)

## 2018-02-15 LAB — SALICYLATE LEVEL
Salicylate Lvl: <7 mg/dL (ref 2.8–30.0)
Salicylate Lvl: <7 mg/dL (ref 2.8–30.0)
Salicylate Lvl: <7 mg/dL (ref 2.8–30.0)

## 2018-02-15 LAB — PROTIME-INR
INR: 1.1
Prothrombin Time: 14.1 seconds (ref 11.4–15.2)

## 2018-02-15 LAB — ETHANOL: Alcohol, Ethyl (B): <10 mg/dL (ref <10)

## 2018-02-15 MED ORDER — SODIUM CHLORIDE 0.9 % IV BOLUS
1000.0000 mL | Freq: Once | INTRAVENOUS | Status: AC
  Administered 2018-02-15: 1000 mL via INTRAVENOUS

## 2018-02-15 MED ORDER — LORAZEPAM 2 MG/ML IJ SOLN
1.0000 mg | Freq: Once | INTRAMUSCULAR | Status: AC
  Administered 2018-02-15: 1 mg via INTRAVENOUS
  Filled 2018-02-15: qty 1

## 2018-02-15 MED ORDER — LORAZEPAM 2 MG/ML IJ SOLN
1.0000 mg | INTRAMUSCULAR | Status: AC
  Administered 2018-02-16 (×4): 1 mg via INTRAVENOUS
  Filled 2018-02-15 (×4): qty 1

## 2018-02-15 MED ORDER — LORAZEPAM 2 MG/ML IJ SOLN
2.0000 mg | Freq: Once | INTRAMUSCULAR | Status: AC
  Administered 2018-02-15: 2 mg via INTRAVENOUS

## 2018-02-15 NOTE — ED Notes (Signed)
Resp therapy at bedside

## 2018-02-15 NOTE — ED Notes (Signed)
NS bolus hung @ 19:41.

## 2018-02-15 NOTE — H&P (Signed)
[facility] - [place] at [facility]   PATIENT NAME: Allen Wilson    MR#:  [id number]  DATE OF BIRTH:  February 27, 1995  DATE OF ADMISSION:  02/15/2018  PRIMARY CARE PHYSICIAN: Patient, No Pcp Per   REQUESTING/REFERRING PHYSICIAN: Alphonzo LemmingsMcShane, MD  CHIEF COMPLAINT:   Chief Complaint  Patient presents with  . Drug Overdose    HISTORY OF PRESENT ILLNESS:  Allen Wilson  is a 22 y.o. male who presents with chief complaint as above.  Patient is unable to contribute to his HPI given his confusion.  Family is at bedside and reports that the patient was found in his current state, and had seizure-like activity.  He was reported to have taken something like 50 tablets of doxylamine, and some quantity of Paxil.  He certainly demonstrates anticholinergic symptoms here in the ED.  Hospitalist were called for admission  PAST MEDICAL HISTORY:   Past Medical History:  Diagnosis Date  . Tachycardia      PAST SURGICAL HISTORY:   Past Surgical History:  Procedure Laterality Date  . NO PAST SURGERIES       SOCIAL HISTORY:   Social History   Tobacco Use  . Smoking status: Never Smoker  . Smokeless tobacco: Current User    Types: Chew  Substance Use Topics  . Alcohol use: No     FAMILY HISTORY:   Family History  Problem Relation Age of Onset  . Other Mother        alive & well.  . Hypertension Father   . Heart attack Maternal Grandfather        x 2 - first in his 4340's.     DRUG ALLERGIES:  No Known Allergies  MEDICATIONS AT HOME:   Prior to Admission medications   Medication Sig Start Date End Date Taking? Authorizing Provider  metoprolol tartrate (LOPRESSOR) 25 MG tablet Take 1 tablet (25 mg total) by mouth 2 (two) times daily as needed (for heart rate greater than 110). 09/19/16  Yes Blakeney, Neldon Newportana G, NP  PARoxetine (PAXIL) 20 MG tablet Take 20 mg by mouth daily.   Yes [provider]    REVIEW OF SYSTEMS:  Review of Systems  Unable  to perform ROS: Acuity of condition     VITAL SIGNS:   Vitals:   02/15/18 2100 02/15/18 2115 02/15/18 2120 02/15/18 2127  BP: (!) 163/97     Pulse: (!) 129 (!) 128 (!) 128 (!) 133  Resp:  (!) 22 (!) 27 (!) 23  Temp:      TempSrc:      SpO2: 100% 100% 100% 100%  Weight:      Height:       Wt Readings from Last 3 Encounters:  02/15/18 111.1 kg  05/06/17 99.8 kg  01/03/17 93 kg    PHYSICAL EXAMINATION:  Physical Exam  Vitals reviewed. Constitutional: He appears well-developed and well-nourished. No distress.  HENT:  Head: Normocephalic and atraumatic.  Mouth/Throat: Oropharynx is clear and moist.  Eyes: Pupils are equal, round, and reactive to light. Conjunctivae and EOM are normal. No scleral icterus.  Neck: Normal range of motion. Neck supple. No JVD present. No thyromegaly present.  Cardiovascular: Normal rate, regular rhythm and intact distal pulses. Exam reveals no gallop and no friction rub.  No murmur heard. Respiratory: Effort normal and breath sounds normal. No respiratory distress. He has no wheezes. He has no rales.  GI: Soft. Bowel sounds are normal. He exhibits no distension. There is  no abdominal tenderness.  Musculoskeletal: Normal range of motion.        General: No edema.     Comments: No arthritis, no gout  Lymphadenopathy:    He has no cervical adenopathy.  Neurological: He is alert. No cranial nerve deficit.  Patient is confused  Skin: Skin is warm and dry. No rash noted. No erythema.  Psychiatric:  Unable to assess due to patient condition    LABORATORY PANEL:   CBC Recent Labs  Lab 02/15/18 1946  WBC 16.5*  HGB 17.7*  HCT 55.7*  PLT 313   ------------------------------------------------------------------------------------------------------------------  Chemistries  Recent Labs  Lab 02/15/18 1946  NA 142  K 3.0*  CL 103  CO2 11*  GLUCOSE 103*  BUN 17  CREATININE 1.27*  CALCIUM 10.3  AST 42*  ALT 32  ALKPHOS 80  BILITOT 0.8    ------------------------------------------------------------------------------------------------------------------  Cardiac Enzymes No results for input(s): TROPONINI in the last 168 hours. ------------------------------------------------------------------------------------------------------------------  RADIOLOGY:  No results found.  EKG:   Orders placed or performed during the hospital encounter of 02/15/18  . ED EKG  . ED EKG    IMPRESSION AND PLAN:  Principal Problem:   Poisoning by anticholinergic drug, intentional self-harm, initial encounter (HCC) -we use benzos to help control his symptoms, IV fluids for supportive treatment, patient was IVC by ED physician, psychiatry consult Active Problems:   Acute psychosis (HCC) -due to his overdose, psychiatry consult as above   Tachycardia -patient has a history of tachycardia, we will continue his home dose of beta-blocker  Chart review performed and case discussed with ED provider. Labs, imaging and/or ECG reviewed by provider and discussed with patient/family. Management plans discussed with the patient and/or family.  DVT PROPHYLAXIS: SubQ lovenox   GI PROPHYLAXIS:  None  ADMISSION STATUS: Inpatient     CODE STATUS: Full Code Status History    Date Active Date Inactive Code Status Order ID Comments User Context   09/18/2016 0034 09/19/2016 1809 Full Code [id number]  Delfino LovettShah, Vipul, MD Inpatient      TOTAL TIME TAKING CARE OF THIS PATIENT: 45 minutes.   [name] FIELDING 02/15/2018, 9:51 PM  [facility]  Office  [phone]  CC: Primary care physician; Patient, No Pcp Per  Note:  This document was prepared using Dragon voice recognition software and may include unintentional dictation errors.

## 2018-02-15 NOTE — ED Notes (Signed)
Pt remains alert but will not answer questions. Keeps attempting to remove mask. Mother and father remain at bedside.

## 2018-02-15 NOTE — ED Provider Notes (Addendum)
[facility] Emergency Department Provider Note  ____________________________________________   I have reviewed the triage vital signs and the nursing notes. Where available I have reviewed prior notes and, if possible and indicated, outside hospital notes.    HISTORY  Chief Complaint Drug Overdose    HPI Allen Wilson is a 22 y.o. male history of psychiatric issues, and depression, mother states that he is been depressed over the last month.  Has not voiced to her any SI or HI however did see an outpatient provider who started him on paroxetine 20 mg tablets on the 12th of this month.  Patient called his mother today at around [id number] stating that at 630 according to her, he had taken an overdose of medications and was feeling weird.  He states that he took the medications were on his nightstand and only this medications, on the nightstand, she found a bottle of wal-som and his bottle of Paxil.  Patient did encourage him to try to vomit and she states he threw up some pills but she does not know how many.  He was confused in the car trip on the way over here, talking by things that did not make sense and when he arrived he had a witnessed grand mal seizure in the chair without falling.  There was no trauma.  She believes he took no other medications besides the ones listed.  Review of these bottles suggest that there are 8 pills left in a 60 pill 50 mg diphenhydramine bottle, and there are 11 pills left of the Paxil, if he had been taking them every day, there would be 32 pills left suggesting that the patient may have taken 21 of those pills.  This is of course difficult to verify patient himself cannot give a history.  Level 5 chart caveat; no further history available due to patient status.  Patient does not have any allergies to any medications according to mother and she states that she believes very strongly no other pills were taken.  And her best estimate is that they  were taken at 630.  Patient has a history of "tachycardia" and is supposed to be taking a beta-blocker apparently for this but is not taking it.  There is no evidence according to the mother that he took it today.     Past Medical History:  Diagnosis Date  . Tachycardia     Patient Active Problem List   Diagnosis Date Noted  . Acute psychosis (HCC)   . Drug overdose 09/17/2016    History reviewed. No pertinent surgical history.  Prior to Admission medications   Medication Sig Start Date End Date Taking? Authorizing Provider  metoprolol tartrate (LOPRESSOR) 25 MG tablet Take 1 tablet (25 mg total) by mouth 2 (two) times daily as needed (for heart rate greater than 110). 09/19/16   Eugenie NorrieBlakeney, Dana G, NP    Allergies Patient has no known allergies.  Family History  Problem Relation Age of Onset  . Other Mother        alive & well.  . Hypertension Father   . Heart attack Maternal Grandfather        x 2 - first in his 240's.    Social History Social History   Tobacco Use  . Smoking status: Never Smoker  . Smokeless tobacco: Current User    Types: Chew  Substance Use Topics  . Alcohol use: No  . Drug use: No    Review of Systems Level  5 chart caveat; no further history available due to patient status.   ____________________________________________   PHYSICAL EXAM:  VITAL SIGNS: ED Triage Vitals  Enc Vitals Group     BP 02/15/18 1938 (!) 156/107     Pulse Rate 02/15/18 1934 (!) 173     Resp 02/15/18 1934 (!) 24     Temp 02/15/18 1938 99.7 F (37.6 C)     Temp Source 02/15/18 1938 Oral     SpO2 02/15/18 1934 100 %     Weight 02/15/18 1939 245 lb (111.1 kg)     Height 02/15/18 1939 6' (1.829 m)     Head Circumference --      Peak Flow --      Pain Score --      Pain Loc --      Pain Edu? --      Excl. in GC? --     Constitutional: And is staring about, altered, not talking to me but appears to be guarding his airway. Eyes: Conjunctivae are normal pupils  are mid level and active Head: Atraumatic HEENT: No congestion/rhinnorhea. Mucous membranes are moist.  Oropharynx non-erythematous Neck:   Nontender with no meningismus, no masses, no stridor Cardiovascular: Very rapid heart rate, regular rhythm. Grossly normal heart sounds.  Good peripheral circulation. Respiratory: Normal respiratory effort.  No retractions. Lungs CTAB.,  There is tachypnea noted. Abdominal: Soft and nontender. No distention. No guarding no rebound Back:  There is no focal tenderness or step off.  there is no midline tenderness there are no lesions noted. there is no CVA tenderness Musculoskeletal: No lower extremity tenderness, no upper extremity tenderness. No joint effusions, no DVT signs strong distal pulses no edema Neurologic: No obvious focality to his exam moves all extremities, patient will not otherwise interact with the exam but no evidence of focal findings.  Also he has no rigidity, we will or otherwise. Skin:  Skin is warm, dry and intact. No rash noted.   ____________________________________________   LABS (all labs ordered are listed, but only abnormal results are displayed)  Labs Reviewed  ACETAMINOPHEN LEVEL  SALICYLATE LEVEL  CBC WITH DIFFERENTIAL/PLATELET  ETHANOL  COMPREHENSIVE METABOLIC PANEL  URINALYSIS, COMPLETE (UACMP) WITH MICROSCOPIC  URINE DRUG SCREEN, QUALITATIVE ([facility] ONLY)  PROTIME-INR  BLOOD GAS, VENOUS    Pertinent labs  results that were available during my care of the patient were reviewed by me and considered in my medical decision making (see chart for details). ____________________________________________  EKG  I personally interpreted any EKGs ordered by me or triage EKG shows sinus tachycardia, rate is very rapid at 172, QTC is 569, nonspecific ST changes are noted, normal axis, narrow complex ____________________________________________  RADIOLOGY  Pertinent labs & imaging results that were available during my care  of the patient were reviewed by me and considered in my medical decision making (see chart for details). If possible, patient and/or family made aware of any abnormal findings.  No results found. ____________________________________________    PROCEDURES  Procedure(s) performed: None  Procedures  Critical Care performed: CRITICAL CARE Performed by: Jeanmarie Plant   Total critical care time: 55 minutes  Critical care time was exclusive of separately billable procedures and treating other patients.  Critical care was necessary to treat or prevent imminent or life-threatening deterioration.  Critical care was time spent personally by me on the following activities: development of treatment plan with patient and/or surrogate as well as nursing, discussions with consultants, evaluation of patient's response to  treatment, examination of patient, obtaining history from patient or surrogate, ordering and performing treatments and interventions, ordering and review of laboratory studies, ordering and review of radiographic studies, pulse oximetry and re-evaluation of patient's condition.   ____________________________________________   INITIAL IMPRESSION / ASSESSMENT AND PLAN / ED COURSE  Pertinent labs & imaging results that were available during my care of the patient were reviewed by me and considered in my medical decision making (see chart for details).  Patient here over after an overdose of Paxil as well as from hydramine.  Unclear exactly how many pills he took but up to 2 diphenhydramine are missing, and if he were compliant with his medications prior today, up to 21 Paxil are missing.  These numbers obviously are not exact.  Time of ingestion was at approximately 630 according to mother.  I have already talked to poison control.  When he first came in, he was very tachycardic, and I did give him 2 of Ativan to start with, his heart rate is come down to the 140s and 150s after this  we are giving him IV fluid, he was postictal which gives me a difficult time to fully evaluate his mental status vis a vis his overdose itself.  He is not oriented he certainly appears to be guarding his airway at this time and I will forestall intubation unless he seems to deteriorate in any significant way.  We will continue giving him benzos on a as needed basis, after discussion with poison control there is no further intervention required from them aside from supportive care with benzos.  Patient does have a Tylenol and salicylate level pending and we will send for our levels with those.  We will take out IVC paperwork, and we will obviously be admitting the patient  ----------------------------------------- 8:24 PM on 02/15/2018 -----------------------------------------  Patient is awake, looking about with a vacant stare of someone who has overdosed on diphenhydramine.  Very atypical presentation.  Heart rate is come down to 140s, we will continue giving as needed Ativan, he is not agitated he is relaxed but he certainly is not following commands.  He is breathing somewhat rapidly, and he is somewhat acidotic, I would like to avoid intubation if I can we are giving him fluids and we will continue to monitor him per poison control toxicologist advice  ----------------------------------------- 8:47 PM on 02/15/2018 ----------------------------------------- Pt under care of hospitalists.    ____________________________________________   FINAL CLINICAL IMPRESSION(S) / ED DIAGNOSES  Final diagnoses:  Overdose      This chart was dictated using voice recognition software.  Despite best efforts to proofread,  errors can occur which can change meaning.      Jeanmarie PlantMcShane, Dorothy Polhemus A, MD 02/15/18 2006    Jeanmarie PlantMcShane, Daisja Kessinger A, MD 02/15/18 2025    Jeanmarie PlantMcShane, Ori Trejos A, MD 02/15/18 [id number]    Jeanmarie PlantMcShane, Jamiyah Dingley A, MD 02/15/18 2325

## 2018-02-15 NOTE — ED Notes (Signed)
Father at bedside with pt while mom talking with officer.

## 2018-02-15 NOTE — ED Notes (Signed)
RRT at bedside and received blood gas in ice cup.

## 2018-02-15 NOTE — ED Notes (Signed)
Code cart at bedside. Cardiac monitor applied & zoll monitoring as well.

## 2018-02-15 NOTE — ED Notes (Signed)
Pt makes statement "I have to pee" but was unsuccessful using urinal when sat up in bed. Unsafe to sit on edge of bed or stand.

## 2018-02-15 NOTE — ED Notes (Signed)
Pt placed on non-rebreather upon arrival to room.

## 2018-02-15 NOTE — ED Notes (Addendum)
Pt will answer some of mom's questions but only short answers and most slurred/incomprehensible.

## 2018-02-15 NOTE — ED Notes (Signed)
EDP at bedside at 19:27, EKG completed at 19:33, 18g L hand at 19:30; 18g R hand at 19:40; BG 110; ST 175 on arrival ro room; 2 of Ativan at 19:33; labs drawn at 19:32. Mom denies pt has med allergies or any med hist except for new prescription antidepressants. Seizure pads placed on bed. Mother at bedside.

## 2018-02-15 NOTE — ED Notes (Signed)
Poison control keeping up with pt.

## 2018-02-15 NOTE — ED Triage Notes (Signed)
Pt brought in by mother for overdose. Pt took unknown number of sleeping pills (Wal-som) and paroxetine then called mom. Mom describes pt having seizure and pt had 2nd seizure on arrival.

## 2018-02-16 ENCOUNTER — Inpatient Hospital Stay
Admission: AD | Admit: 2018-02-16 | Discharge: 2018-02-19 | DRG: 885 | Disposition: A | Discharge status: Home / Self Care | Payer: BLUE CROSS/BLUE SHIELD | Attending: Psychiatry | Admitting: Psychiatry

## 2018-02-16 ENCOUNTER — Other Ambulatory Visit: Payer: Self-pay

## 2018-02-16 DIAGNOSIS — G4700 Insomnia, unspecified: Secondary | ICD-10-CM | POA: Diagnosis present

## 2018-02-16 DIAGNOSIS — F322 Major depressive disorder, single episode, severe without psychotic features: Secondary | ICD-10-CM | POA: Diagnosis not present

## 2018-02-16 DIAGNOSIS — Z915 Personal history of self-harm: Secondary | ICD-10-CM

## 2018-02-16 DIAGNOSIS — T50901A Poisoning by unspecified drugs, medicaments and biological substances, accidental (unintentional), initial encounter: Secondary | ICD-10-CM | POA: Diagnosis present

## 2018-02-16 DIAGNOSIS — R9431 Abnormal electrocardiogram [ECG] [EKG]: Secondary | ICD-10-CM

## 2018-02-16 DIAGNOSIS — R000 Tachycardia, unspecified: Secondary | ICD-10-CM

## 2018-02-16 LAB — CBC
HCT: 43.4 % (ref 39.0–52.0)
Hemoglobin: 14.6 g/dL (ref 13.0–17.0)
MCH: 29.4 pg (ref 26.0–34.0)
MCHC: 33.6 g/dL (ref 30.0–36.0)
MCV: 87.5 fL (ref 80.0–100.0)
PLATELETS: 235 10*3/uL (ref 150–400)
RBC: 4.96 MIL/uL (ref 4.22–5.81)
RDW: 11.9 % (ref 11.5–15.5)
WBC: 10 10*3/uL (ref 4.0–10.5)
nRBC: 0 % (ref 0.0–0.2)

## 2018-02-16 LAB — BASIC METABOLIC PANEL
Anion gap: 7 (ref 5–15)
BUN: 15 mg/dL (ref 6–20)
CO2: 22 mmol/L (ref 22–32)
CREATININE: 1 mg/dL (ref 0.61–1.24)
Calcium: 8.7 mg/dL — ABNORMAL LOW (ref 8.9–10.3)
Chloride: 107 mmol/L (ref 98–111)
GFR calc Af Amer: >60 mL/min (ref >60)
GFR calc non Af Amer: >60 mL/min (ref >60)
Glucose, Bld: 85 mg/dL (ref 70–99)
Potassium: 3.4 mmol/L — ABNORMAL LOW (ref 3.5–5.1)
Sodium: 136 mmol/L (ref 135–145)

## 2018-02-16 LAB — GLUCOSE, CAPILLARY: Glucose-Capillary: 110 mg/dL — ABNORMAL HIGH (ref 70–99)

## 2018-02-16 MED ORDER — METOPROLOL TARTRATE 25 MG PO TABS: 25.0000 mg | ORAL_TABLET | Freq: Two times a day (BID) | ORAL | Status: DC | PRN

## 2018-02-16 MED ORDER — POTASSIUM CHLORIDE CRYS ER 20 MEQ PO TBCR
40.0000 meq | EXTENDED_RELEASE_TABLET | Freq: Once | ORAL | Status: AC
  Administered 2018-02-16: 40 meq via ORAL
  Filled 2018-02-16: qty 2

## 2018-02-16 MED ORDER — ALUM & MAG HYDROXIDE-SIMETH 200-200-20 MG/5ML PO SUSP: 30.0000 mL | ORAL | Status: DC | PRN

## 2018-02-16 MED ORDER — MAGNESIUM HYDROXIDE 400 MG/5ML PO SUSP: 30.0000 mL | Freq: Every day | ORAL | Status: DC | PRN

## 2018-02-16 MED ORDER — TEMAZEPAM 15 MG PO CAPS: 15.0000 mg | ORAL_CAPSULE | Freq: Every evening | ORAL | Status: DC | PRN

## 2018-02-16 MED ORDER — ACETAMINOPHEN 325 MG PO TABS: 650.0000 mg | ORAL_TABLET | Freq: Four times a day (QID) | ORAL | Status: DC | PRN

## 2018-02-16 MED ORDER — ENOXAPARIN SODIUM 40 MG/0.4ML ~~LOC~~ SOLN
40.0000 mg | SUBCUTANEOUS | Status: DC
  Administered 2018-02-16: 40 mg via SUBCUTANEOUS
  Filled 2018-02-16: qty 0.4

## 2018-02-16 MED ORDER — LORAZEPAM 2 MG/ML IJ SOLN: 2.0000 mg | INTRAMUSCULAR | Status: DC | PRN

## 2018-02-16 MED ORDER — SODIUM CHLORIDE 0.9 % IV SOLN
INTRAVENOUS | Status: AC
  Administered 2018-02-16: 75 mL via INTRAVENOUS

## 2018-02-16 MED ORDER — LORAZEPAM 1 MG PO TABS: 1.0000 mg | ORAL_TABLET | Freq: Four times a day (QID) | ORAL | Status: DC | PRN

## 2018-02-16 MED ORDER — ONDANSETRON HCL 4 MG/2ML IJ SOLN: 4.0000 mg | Freq: Four times a day (QID) | INTRAMUSCULAR | Status: DC | PRN

## 2018-02-16 MED ORDER — ONDANSETRON HCL 4 MG PO TABS: 4.0000 mg | ORAL_TABLET | Freq: Four times a day (QID) | ORAL | Status: DC | PRN

## 2018-02-16 NOTE — Progress Notes (Signed)
New admit to the unit, IVC-ed by his  parents for attempted suicide by taking over dose of pills over-the counter sleeping pills and unknown quantity of paxil an anti-depressant. Patient is living with the girl friend and 10 month baby , patient said he believe his girl friend is cheating on him with another man and wouldn't stop sending pictures  of her self to this man. 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 Patient is stable now and want to restore his relationship with his family. Patient is calm , unit guide lines and expected behaviors are discussed , cold tray and beverages  Provided , hygiene products provided , room and unit orientation complete, patient room is within eyesight from the nurses station . Skin and body search done two nurses present , no contraband found and skin is clean, patient denies any SI/HI/AVH at this time. Patient is reminded of 15 minute safety checks is progress, no distress.

## 2018-02-16 NOTE — BH Assessment (Signed)
Patient is to be admitted to [facility] BMU by Dr. Toni Amendlapacs.  Attending Physician will be Dr. Jennet MaduroPucilowska.   Patient has been assigned to room 302, by [facility] Charge Nurse Lorenda HatchetSlade.   ER staff is aware of the admission:  Lupita DawnGwen S., Patient Access.

## 2018-02-16 NOTE — Progress Notes (Signed)
[facility] - [place] at [facility]   PATIENT NAME: Allen Wilson    MR#:  [id number]  DATE OF BIRTH:  02-Feb-1996  SUBJECTIVE:   Patient here with ingestion of Paxil and sleeping pills.  IVC filled out by ER physician and patient has sitter at bedside. Patient's mother is at bedside  REVIEW OF SYSTEMS:    Review of Systems  Constitutional: Negative for fever, chills weight loss HENT: Negative for ear pain, nosebleeds, congestion, facial swelling, rhinorrhea, neck pain, neck stiffness and ear discharge.   Respiratory: Negative for cough, shortness of breath, wheezing  Cardiovascular: Negative for chest pain, palpitations and leg swelling.  Gastrointestinal: Negative for heartburn, abdominal pain, vomiting, diarrhea or consitpation Genitourinary: Negative for dysuria, urgency, frequency, hematuria Musculoskeletal: Negative for back pain or joint pain Neurological: Negative for dizziness, seizures, syncope, focal weakness,  numbness and headaches.  Hematological: Does not bruise/bleed easily.  Psychiatric/Behavioral: Positive for suicidal attempt and depression    Tolerating Diet: yes      DRUG ALLERGIES:  No Known Allergies  VITALS:  Blood pressure (!) 149/95, pulse (!) 111, temperature 98.7 F (37.1 C), temperature source Oral, resp. rate 19, height 6\' 1"  (1.854 m), weight 102.3 kg, SpO2 100 %.  PHYSICAL EXAMINATION:  Constitutional: Appears well-developed and well-nourished. No distress. HENT: Normocephalic. Marland Kitchen. Oropharynx is clear dry mouth Eyes: Conjunctivae and EOM are normal. PERRLA, no scleral icterus.  Neck: Normal ROM. Neck supple. No JVD. No tracheal deviation. CVS: RRR, S1/S2 +, no murmurs, no gallops, no carotid bruit.  Pulmonary: Effort and breath sounds normal, no stridor, rhonchi, wheezes, rales.  Abdominal: Soft. BS +,  no distension, tenderness, rebound or guarding.  Musculoskeletal: Normal range of motion. No edema and no tenderness.   Neuro: Alert. CN 2-12 grossly intact. No focal deficits. Skin: Skin is warm and dry. No rash noted. Psychiatric:depressed     LABORATORY PANEL:   CBC Recent Labs  Lab 02/16/18 0922  WBC 10.0  HGB 14.6  HCT 43.4  PLT 235   ------------------------------------------------------------------------------------------------------------------  Chemistries  Recent Labs  Lab 02/15/18 1946 02/16/18 0922  NA 142 136  K 3.0* 3.4*  CL 103 107  CO2 11* 22  GLUCOSE 103* 85  BUN 17 15  CREATININE 1.27* 1.00  CALCIUM 10.3 8.7*  AST 42*  --   ALT 32  --   ALKPHOS 80  --   BILITOT 0.8  --    ------------------------------------------------------------------------------------------------------------------  Cardiac Enzymes No results for input(s): TROPONINI in the last 168 hours. ------------------------------------------------------------------------------------------------------------------  RADIOLOGY:  No results found.   ASSESSMENT AND PLAN:   22 year old male with no past medical history who presents after suicidal attempt on Paxil and sleeping pills.  1.  Suicidal attempt after ingesting Paxil and sleeping pills with anticholinergic symptoms: Poison control has been called in the ER Continue telemetry monitoring and EKG Continue sitter Psychiatry evaluation pending  2.  History of tachycardia: Continue metoprolol   3  Hypokalemia: Replete  4.  Acute kidney injury in the setting of dehydration: This is resolved  Management plans discussed with the patient and he is in agreement.  CODE STATUS: full  TOTAL TIME TAKING CARE OF THIS PATIENT: 30 minutes.     POSSIBLE D/C 1-2 days, DEPENDING ON CLINICAL CONDITION.   Alyza Artiaga M.D on 02/16/2018 at 10:35 AM  Between 7am to 6pm - Pager - [phone] After 6pm go to [email or web address] - Social research officer, governmentpassword [email or web address]  [facility] [place] [facility]  Office  [phone]  CC:  Primary care physician; Patient, No Pcp  Per  Note: This dictation was prepared with Dragon dictation along with smaller phrase technology. Any transcriptional errors that result from this process are unintentional.

## 2018-02-16 NOTE — ED Notes (Signed)
Pt resting in bed at this time, mom at bedside. Eber Jonesarolyn, NT safety sitting with patient. S1S2 Heart sounds, skin warm, dry, and intact. Pt's HR noted to become tachycardic upon awakening. Clear breath sounds on auscultation. Pt refuses to speak to this RN or his mother, when asked questions patient just shrugs. Will continue to monitor, [place], NT to continue 15 min checks on patient.

## 2018-02-16 NOTE — Discharge Summary (Signed)
[facility] - [place] at [facility]   PATIENT NAME: Allen Wilson    MR#:  [id number]  DATE OF BIRTH:  [birth year]  DATE OF ADMISSION:  02/15/2018 ADMITTING PHYSICIAN: Oralia Manisavid Willis, MD  DATE OF DISCHARGE: 02/16/2018  PRIMARY CARE PHYSICIAN: Patient, No Pcp Per    ADMISSION DIAGNOSIS:  Overdose [T50.901A]  DISCHARGE DIAGNOSIS:  Principal Problem:   Severe major depression, single episode (HCC) Active Problems:   Drug overdose   Acute psychosis (HCC)   Poisoning by anticholinergic drug, intentional self-harm, initial encounter (HCC)   Tachycardia   SECONDARY DIAGNOSIS:   Past Medical History:  Diagnosis Date  . Tachycardia     HOSPITAL COURSE:  22 year old male with no past medical history who presents after suicidal attempt on Paxil and sleeping pills.  1.  Suicidal attempt after ingesting Paxil and sleeping pills with anticholinergic symptoms: Poison control was called in the ED. He is medically stable to discharge to behavioral health today. HE was evaluated by Dr Toni Amendlapacs  2.  History of tachycardia: Continue metoprolol   3  Hypokalemia: Repleted  4.  Acute kidney injury in the setting of dehydration: This is resolved   DISCHARGE CONDITIONS AND DIET:  Stable Regular doet  CONSULTS OBTAINED:  Treatment Team:  Clapacs, Jackquline DenmarkJohn T, MD  DRUG ALLERGIES:  No Known Allergies  DISCHARGE MEDICATIONS:   Allergies as of 02/16/2018   No Known Allergies     Medication List    STOP taking these medications   PARoxetine 20 MG tablet Commonly known as:  PAXIL     TAKE these medications   metoprolol tartrate 25 MG tablet Commonly known as:  LOPRESSOR Take 1 tablet (25 mg total) by mouth 2 (two) times daily as needed (for heart rate greater than 110).         Today   CHIEF COMPLAINT:  Here with SA   VITAL SIGNS:  Blood pressure (!) 146/86, pulse 91, temperature 98.5 F (36.9 C), temperature source Axillary, resp. rate 18,  height 6\' 1"  (1.854 m), weight 102.3 kg, SpO2 100 %.   REVIEW OF SYSTEMS:  Review of Systems  Constitutional: Negative.  Negative for chills, fever and malaise/fatigue.  HENT: Negative.  Negative for ear discharge, ear pain, hearing loss, nosebleeds and sore throat.   Eyes: Negative.  Negative for blurred vision and pain.  Respiratory: Negative.  Negative for cough, hemoptysis, shortness of breath and wheezing.   Cardiovascular: Negative.  Negative for chest pain, palpitations and leg swelling.  Gastrointestinal: Negative.  Negative for abdominal pain, blood in stool, diarrhea, nausea and vomiting.  Genitourinary: Negative.  Negative for dysuria.  Musculoskeletal: Negative.  Negative for back pain.  Skin: Negative.   Neurological: Negative for dizziness, tremors, speech change, focal weakness, seizures and headaches.  Endo/Heme/Allergies: Negative.  Does not bruise/bleed easily.  Psychiatric/Behavioral: Positive for depression. Negative for hallucinations and suicidal ideas.     PHYSICAL EXAMINATION:  GENERAL:  22 y.o.-year-old patient lying in the bed with no acute distress.  NECK:  Supple, no jugular venous distention. No thyroid enlargement, no tenderness.  LUNGS: Normal breath sounds bilaterally, no wheezing, rales,rhonchi  No use of accessory muscles of respiration.  CARDIOVASCULAR: S1, S2 normal. No murmurs, rubs, or gallops.  ABDOMEN: Soft, non-tender, non-distended. Bowel sounds present. No organomegaly or mass.  EXTREMITIES: No pedal edema, cyanosis, or clubbing.  PSYCHIATRIC: The patient is alert and oriented x 3.  SKIN: No obvious rash, lesion, or ulcer.   DATA REVIEW:   CBC  Recent Labs  Lab 02/16/18 0922  WBC 10.0  HGB 14.6  HCT 43.4  PLT 235    Chemistries  Recent Labs  Lab 02/15/18 1946 02/16/18 0922  NA 142 136  K 3.0* 3.4*  CL 103 107  CO2 11* 22  GLUCOSE 103* 85  BUN 17 15  CREATININE 1.27* 1.00  CALCIUM 10.3 8.7*  AST 42*  --   ALT 32  --    ALKPHOS 80  --   BILITOT 0.8  --     Cardiac Enzymes No results for input(s): TROPONINI in the last 168 hours.  Microbiology Results  @MICRORSLT48 @  RADIOLOGY:  No results found.    Allergies as of 02/16/2018   No Known Allergies     Medication List    STOP taking these medications   PARoxetine 20 MG tablet Commonly known as:  PAXIL     TAKE these medications   metoprolol tartrate 25 MG tablet Commonly known as:  LOPRESSOR Take 1 tablet (25 mg total) by mouth 2 (two) times daily as needed (for heart rate greater than 110).        Management plans discussed with the patient and he is in agreement. Stable for discharge psych  Patient should follow up with dr clapacs  CODE STATUS:     Code Status Orders  (From admission, onward)         Start     Ordered   02/16/18 0019  Full code  Continuous     02/16/18 0018        Code Status History    Date Active Date Inactive Code Status Order ID Comments User Context   09/18/2016 0034 09/19/2016 1809 Full Code [id number]  Delfino LovettShah, Vipul, MD Inpatient      TOTAL TIME TAKING CARE OF THIS PATIENT: 38 minutes.    Note: This dictation was prepared with Dragon dictation along with smaller phrase technology. Any transcriptional errors that result from this process are unintentional.  Johnnie Moten M.D on 02/16/2018 at 5:19 PM  Between 7am to 6pm - Pager - [phone] After 6pm go to [email or web address] - password [email or web address]  [facility]  Office  [phone]  CC: Primary care physician; Patient, No Pcp Per

## 2018-02-16 NOTE — Consult Note (Signed)
[facility] Face-to-Face Psychiatry Consult   Reason for Consult: Consult for this 22 year old man admitted to the hospital after intentional overdose Referring Physician: Mody Patient Identification: JOELL USMAN MRN:  [id number] Principal Diagnosis: Severe major depression, single episode (HCC) Diagnosis:  Principal Problem:   Severe major depression, single episode (HCC) Active Problems:   Drug overdose   Acute psychosis (HCC)   Poisoning by anticholinergic drug, intentional self-harm, initial encounter (HCC)   Tachycardia   Total Time spent with patient: 1 hour  Subjective:   Normon Pettijohn Kudrna is a 22 y.o. male patient admitted with "depressed and suicide"  HPI: Patient seen chart reviewed.  22 year old man came into the hospital after taking an overdose of a combination of peroxide teen and over-the-counter sleeping pills.  Patient was very sedated when he first arrived but did not require intubation.  History was obtained from the patient and his family that he had intentionally overdosed but then notified his family shortly thereafter.  On interview today the patient was only partially cooperative.  He appears to fall asleep many times during the interview although I am suspicious that some of this could be intentional avoidance.  Patient admits that he took an overdose of pills.  He said that he has been very depressed for over a month.  Most recent severe problem was that his long-term girlfriend and he broke up a week or 2 ago.  Patient did go to see a primary care doctor recently and was prescribed the paroxetine.  He has reported that ever since he started taking it he has been feeling worse.  He is a little vague about in what way he is feeling worse but it sounds like his mood has just been declining and he has been having more suicidal ideation.  No report of alcohol or drug abuse.  Alcohol level was negative no drug screen was obtained.  Social history: Patient had been living with  his girlfriend until recently.  Patient's parents are present in the room.  I ask him if he would prefer to talk without them there and he declined.  Medical history: He has a history of tachycardia which seems to be nonspecific possibly just episodes of supraventricular tachycardia.  Takes metoprolol as needed.  Substance abuse history: No report of substance abuse actively nothing in the old chart about any substance abuse.  The last drug screen was a few months ago and was negative other than try cyclic's which probably represented prescription medicine  Past Psychiatric History: Patient has a history of depression and anxiety.  No known hospitalizations.  No known prior suicide attempts.  As far as I can tell the Paxil was the first medicine he has ever been prescribed.  No known history of mania.  Substance abuse not listed as an active problem  Risk to Self:   Risk to Others:   Prior Inpatient Therapy:   Prior Outpatient Therapy:    Past Medical History:  Past Medical History:  Diagnosis Date  . Tachycardia     Past Surgical History:  Procedure Laterality Date  . NO PAST SURGERIES     Family History:  Family History  Problem Relation Age of Onset  . Other Mother        alive & well.  . Hypertension Father   . Heart attack Maternal Grandfather        x 2 - first in his 21's.   Family Psychiatric  History: Nothing known about this Social History:  Social History   Substance and Sexual Activity  Alcohol Use No     Social History   Substance and Sexual Activity  Drug Use No    Social History   Socioeconomic History  . Marital status: Single    Spouse name: Not on file  . Number of children: Not on file  . Years of education: Not on file  . Highest education level: Not on file  Occupational History  . Occupation: EMT  Social Needs  . Financial resource strain: Not on file  . Food insecurity:    Worry: Not on file    Inability: Not on file  . Transportation  needs:    Medical: Not on file    Non-medical: Not on file  Tobacco Use  . Smoking status: Never Smoker  . Smokeless tobacco: Current User    Types: Chew  Substance and Sexual Activity  . Alcohol use: No  . Drug use: No  . Sexual activity: Not on file  Lifestyle  . Physical activity:    Days per week: Not on file    Minutes per session: Not on file  . Stress: Not on file  Relationships  . Social connections:    Talks on phone: Not on file    Gets together: Not on file    Attends religious service: Not on file    Active member of club or organization: Not on file    Attends meetings of clubs or organizations: Not on file    Relationship status: Not on file  Other Topics Concern  . Not on file  Social History Narrative   Works full time as Museum/gallery exhibitions officerMT.  Lives with mother.  Not currently routinely exercising but used to run cross country and play basketball in [employer].   Additional Social History:    Allergies:  No Known Allergies  Labs:  Results for orders placed or performed during the hospital encounter of 02/15/18 (from the past 48 hour(s))  Glucose, capillary     Status: Abnormal   Collection Time: 02/15/18  7:32 PM  Result Value Ref Range   Glucose-Capillary 110 (H) 70 - 99 mg/dL  Acetaminophen level     Status: Abnormal   Collection Time: 02/15/18  7:46 PM  Result Value Ref Range   Acetaminophen (Tylenol), Serum <10 (L) 10 - 30 ug/mL    Comment: (NOTE) Therapeutic concentrations vary significantly. A range of 10-30 ug/mL  may be an effective concentration for many patients. However, some  are best treated at concentrations outside of this range. Acetaminophen concentrations >150 ug/mL at 4 hours after ingestion  and >50 ug/mL at 12 hours after ingestion are often associated with  toxic reactions. Performed at [facility], [address] [place], [place] [address]   Salicylate level     Status: None   Collection Time: 02/15/18  7:46 PM  Result Value  Ref Range   Salicylate Lvl <7.0 2.8 - 30.0 mg/dL    Comment: Performed at [facility], [address] [place], [place] [address]  CBC with Differential     Status: Abnormal   Collection Time: 02/15/18  7:46 PM  Result Value Ref Range   WBC 16.5 (H) 4.0 - 10.5 K/uL   RBC 6.09 (H) 4.22 - 5.81 MIL/uL   Hemoglobin 17.7 (H) 13.0 - 17.0 g/dL   HCT 09.855.7 (H) 11.939.0 - 14.752.0 %   MCV 91.5 80.0 - 100.0 fL   MCH 29.1 26.0 - 34.0 pg  MCHC 31.8 30.0 - 36.0 g/dL   RDW 16.1 09.6 - 04.5 %   Platelets 313 150 - 400 K/uL   nRBC 0.0 0.0 - 0.2 %   Neutrophils Relative % 59 %   Neutro Abs 9.8 (H) 1.7 - 7.7 K/uL   Lymphocytes Relative 33 %   Lymphs Abs 5.4 (H) 0.7 - 4.0 K/uL   Monocytes Relative 7 %   Monocytes Absolute 1.2 (H) 0.1 - 1.0 K/uL   Eosinophils Relative 0 %   Eosinophils Absolute 0.0 0.0 - 0.5 K/uL   Basophils Relative 1 %   Basophils Absolute 0.1 0.0 - 0.1 K/uL   Immature Granulocytes 0 %   Abs Immature Granulocytes 0.07 0.00 - 0.07 K/uL    Comment: Performed at [facility], [address] [place], [place] [address]  Ethanol     Status: None   Collection Time: 02/15/18  7:46 PM  Result Value Ref Range   Alcohol, Ethyl (B) <10 <10 mg/dL    Comment: (NOTE) Lowest detectable limit for serum alcohol is 10 mg/dL. For medical purposes only. Performed at [facility], [address] [place], [place] [address]   Comprehensive metabolic panel     Status: Abnormal   Collection Time: 02/15/18  7:46 PM  Result Value Ref Range   Sodium 142 135 - 145 mmol/L    Comment: LYTES REPEATED   Potassium 3.0 (L) 3.5 - 5.1 mmol/L   Chloride 103 98 - 111 mmol/L   CO2 11 (L) 22 - 32 mmol/L   Glucose, Bld 103 (H) 70 - 99 mg/dL   BUN 17 6 - 20 mg/dL   Creatinine, Ser 8.29 (H) 0.61 - 1.24 mg/dL   Calcium 56.2 8.9 - 13.0 mg/dL   Total Protein 9.9 (H) 6.5 - 8.1 g/dL   Albumin 5.9 (H) 3.5 - 5.0 g/dL   AST 42 (H) 15 - 41 U/L   ALT 32 0 - 44 U/L   Alkaline Phosphatase 80 38  - 126 U/L   Total Bilirubin 0.8 0.3 - 1.2 mg/dL   GFR calc non Af Amer >60 >60 mL/min   GFR calc Af Amer >60 >60 mL/min   Anion gap 28 (H) 5 - 15    Comment: Performed at [facility], [address], [place], [place] [address]  Protime-INR     Status: None   Collection Time: 02/15/18  7:46 PM  Result Value Ref Range   Prothrombin Time 14.1 11.4 - 15.2 seconds   INR 1.10     Comment: Performed at [facility], [address] [place], [place] [address]  Blood gas, venous     Status: Abnormal   Collection Time: 02/15/18  8:00 PM  Result Value Ref Range   FIO2 1.00    Delivery systems NON-REBREATHER OXYGEN MASK    pH, Ven 7.24 (L) 7.250 - 7.430   pCO2, Ven 39 (L) 44.0 - 60.0 mmHg   pO2, Ven 189.0 (H) 32.0 - 45.0 mmHg   Bicarbonate 16.7 (L) 20.0 - 28.0 mmol/L   Acid-base deficit 10.1 (H) 0.0 - 2.0 mmol/L   O2 Saturation 99.5 %   Patient temperature 37.0    Collection site VENOUS    Sample type VENOUS     Comment: Performed at [facility], [address] [place], [place] [address]  Salicylate level     Status: None   Collection Time: 02/15/18  9:23 PM  Result Value Ref Range   Salicylate Lvl <  7.0 2.8 - 30.0 mg/dL    Comment: Performed at [facility], [address] [place], [place] [address]  Acetaminophen level     Status: Abnormal   Collection Time: 02/15/18  9:23 PM  Result Value Ref Range   Acetaminophen (Tylenol), Serum <10 (L) 10 - 30 ug/mL    Comment: (NOTE) Therapeutic concentrations vary significantly. A range of 10-30 ug/mL  may be an effective concentration for many patients. However, some  are best treated at concentrations outside of this range. Acetaminophen concentrations >150 ug/mL at 4 hours after ingestion  and >50 ug/mL at 12 hours after ingestion are often associated with  toxic reactions. Performed at [facility], [address] [place], [place] [address]   Acetaminophen level     Status: Abnormal    Collection Time: 02/15/18 10:25 PM  Result Value Ref Range   Acetaminophen (Tylenol), Serum <10 (L) 10 - 30 ug/mL    Comment: (NOTE) Therapeutic concentrations vary significantly. A range of 10-30 ug/mL  may be an effective concentration for many patients. However, some  are best treated at concentrations outside of this range. Acetaminophen concentrations >150 ug/mL at 4 hours after ingestion  and >50 ug/mL at 12 hours after ingestion are often associated with  toxic reactions. Performed at [facility], [address] [place], [place] [address]   Salicylate level     Status: None   Collection Time: 02/15/18 10:25 PM  Result Value Ref Range   Salicylate Lvl <7.0 2.8 - 30.0 mg/dL    Comment: Performed at [facility], [address] [place], [place] [address]  CBC     Status: None   Collection Time: 02/16/18  9:22 AM  Result Value Ref Range   WBC 10.0 4.0 - 10.5 K/uL   RBC 4.96 4.22 - 5.81 MIL/uL   Hemoglobin 14.6 13.0 - 17.0 g/dL   HCT 29.5 62.1 - 30.8 %   MCV 87.5 80.0 - 100.0 fL   MCH 29.4 26.0 - 34.0 pg   MCHC 33.6 30.0 - 36.0 g/dL   RDW 65.7 84.6 - 96.2 %   Platelets 235 150 - 400 K/uL   nRBC 0.0 0.0 - 0.2 %    Comment: Performed at [place] [facility], [address] [place], [place] [address]  Basic metabolic panel     Status: Abnormal   Collection Time: 02/16/18  9:22 AM  Result Value Ref Range   Sodium 136 135 - 145 mmol/L   Potassium 3.4 (L) 3.5 - 5.1 mmol/L   Chloride 107 98 - 111 mmol/L   CO2 22 22 - 32 mmol/L   Glucose, Bld 85 70 - 99 mg/dL   BUN 15 6 - 20 mg/dL   Creatinine, Ser 1.32 0.61 - 1.24 mg/dL   Calcium 8.7 (L) 8.9 - 10.3 mg/dL   GFR calc non Af Amer >60 >60 mL/min   GFR calc Af Amer >60 >60 mL/min   Anion gap 7 5 - 15    Comment: Performed at [facility], [address] [place], [place] [address]    Current Facility-Administered Medications  Medication Dose Route Frequency Provider Last Rate Last Dose   . enoxaparin (LOVENOX) injection 40 mg  40 mg Subcutaneous Q24H Oralia Manis, MD   40 mg at 02/16/18 0028  . LORazepam (ATIVAN) injection 2 mg  2 mg Intravenous Q4H PRN Oralia Manis, MD      . metoprolol tartrate (LOPRESSOR) tablet 25 mg  25 mg  Oral BID PRN Oralia ManisWillis, David, MD      . ondansetron [facility](ZOFRAN) tablet 4 mg  4 mg Oral Q6H PRN Oralia ManisWillis, David, MD       Or  . ondansetron [facility](ZOFRAN) injection 4 mg  4 mg Intravenous Q6H PRN Oralia ManisWillis, David, MD        Musculoskeletal: Strength & Muscle Tone: within normal limits Gait & Station: normal Patient leans: N/A  Psychiatric Specialty Exam: Physical Exam  Nursing note and vitals reviewed. Constitutional: He appears well-developed and well-nourished.  HENT:  Head: Normocephalic and atraumatic.  Eyes: Pupils are equal, round, and reactive to light. Conjunctivae are normal.  Neck: Normal range of motion.  Cardiovascular: Regular rhythm and normal heart sounds.  Respiratory: Effort normal. No respiratory distress.  GI: Soft.  Musculoskeletal: Normal range of motion.  Neurological: He is alert.  Skin: Skin is warm and dry.  Psychiatric: His affect is blunt. His speech is delayed. He is slowed and withdrawn. He is not agitated. Cognition and memory are impaired. He expresses impulsivity. He expresses suicidal ideation. He is noncommunicative.    Review of Systems  Constitutional: Negative.   HENT: Negative.   Eyes: Negative.   Respiratory: Negative.   Cardiovascular: Negative.   Gastrointestinal: Negative.   Musculoskeletal: Negative.   Skin: Negative.   Neurological: Negative.   Psychiatric/Behavioral: Positive for depression, memory loss and suicidal ideas. Negative for hallucinations. The patient has insomnia. The patient is not nervous/anxious.     Blood pressure (!) 146/86, pulse 91, temperature 98.5 F (36.9 C), temperature source Axillary, resp. rate 18, height 6\' 1"  (1.854 m), weight 102.3 kg, SpO2 100 %.Body mass index is 29.76  kg/m.  General Appearance: Casual  Eye Contact:  Minimal  Speech:  Slow  Volume:  Decreased  Mood:  Depressed  Affect:  Flat  Thought Process:  Coherent  Orientation:  Full (Time, Place, and Person)  Thought Content:  Patient spoke very little.  At first he answered a few questions and then started to "fall asleep".  As I mentioned I am suspicious that some of that was intentionally wanting to avoid talking.  At no time however did he appear to be delirious or psychotic  Suicidal Thoughts:  Yes.  with intent/plan  Homicidal Thoughts:  No  Memory:  Immediate;   Fair Recent;   Fair Remote;   Fair  Judgement:  Impaired  Insight:  Shallow  Psychomotor Activity:  Decreased  Concentration:  Concentration: Poor  Recall:  [employer]  Fund of Knowledge:  Fair  Language:  Fair  Akathisia:  No  Handed:  Right  AIMS (if indicated):     Assets:  Housing Physical Health Resilience Social Support  ADL's:  Intact  Cognition:  WNL  Sleep:        Treatment Plan Summary: Daily contact with patient to assess and evaluate symptoms and progress in treatment, Medication management and Plan 22 year old man took an intentional overdose with suicidal intent.  Describes a history of major depressive symptoms going on for at least several weeks.  Patient is under involuntary commitment orders.  Meets criteria for IVC.  I think in this situation the right thing to do is to admit him to psychiatry.  I communicated with Dr. Juliene PinaMody the hospitalist who indicates that the patient is medically stable and ready for discharge from their standpoint.  I have spoken with TTS and we are going to arrange for discharge readmit to the psychiatric ward as soon as things are ready.  I will not start any new antidepressant.  As needed medicines for anxiety and sleep but I am going to avoid the usual hydroxyzine so as not to worsen the effects of the anticholinergic he already took.  15-minute checks to be in place.  Disposition:  Recommend psychiatric Inpatient admission when medically cleared. Supportive therapy provided about ongoing stressors.  [name], MD 02/16/2018 5:11 PM

## 2018-02-16 NOTE — Tx Team (Signed)
Initial Treatment Plan 02/16/2018 10:35 PM Xayne Rosann Auerbach Takaki NFA:[id number]    PATIENT STRESSORS: Financial difficulties Marital or family conflict Medication change or noncompliance   PATIENT STRENGTHS: Average or above average intelligence Capable of independent living Communication skills Supportive family/friends   PATIENT IDENTIFIED PROBLEMS:   Suicide Ideations     Conflict with girlfriend    Medication non compliance    Depression/Anxiety       DISCHARGE CRITERIA:  Improved stabilization in mood, thinking, and/or behavior Medical problems require only outpatient monitoring Motivation to continue treatment in a less acute level of care Verbal commitment to aftercare and medication compliance  PRELIMINARY DISCHARGE PLAN: Attend PHP/IOP Attend 12-step recovery group Participate in family therapy Return to previous living arrangement  PATIENT/FAMILY INVOLVEMENT: This treatment plan has been presented to and reviewed with the patient, Timmothy Eulerrenton P [place]ush,   The patient  have been given the opportunity to ask questions and make suggestions.  Lelan PonsAlexander  Jaslene Marsteller, RN 02/16/2018, 10:35 PM

## 2018-02-16 NOTE — ED Notes (Signed)
Revonda Standardllison from poison control called to get report on the Pt. Report was given and they will follow up in the morning.

## 2018-02-17 DIAGNOSIS — F322 Major depressive disorder, single episode, severe without psychotic features: Principal | ICD-10-CM

## 2018-02-17 LAB — HIV ANTIBODY (ROUTINE TESTING W REFLEX): HIV Screen 4th Generation wRfx: NONREACTIVE

## 2018-02-17 MED ORDER — MIRTAZAPINE 15 MG PO TABS
15.0000 mg | ORAL_TABLET | Freq: Every day | ORAL | Status: DC
  Administered 2018-02-17: 15 mg via ORAL
  Filled 2018-02-17: qty 1

## 2018-02-17 NOTE — BHH Counselor (Signed)
Adult Comprehensive Assessment  Patient ID: Allen Wilson, male   DOB: February 26, 1995, 23 y.o.   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 Family Relationships: None reported Surveyor, quantity / Lack of resources (include bankruptcy): None reported Housing / Lack of housing: Stable housing Physical health (include injuries & life threatening diseases): None reported Social relationships: Recently broke up with girlfriend of 2.5 yrs Substance abuse: Pt denies past or present abuse Bereavement / Loss: None reported  Living/Environment/Situation:  Living Arrangements: Spouse/significant other Living conditions (as described by patient or guardian): "Not sure how to describe my environment" Who else lives in the home?: Girlfriend and old daughter How long has patient lived in current situation?: 1 yr What is atmosphere in current home: ("Not sure how to describe my environment")  Family History:  Marital status: Long term relationship Long term relationship, how long?: 2.5 yrs What types of issues is patient dealing with in the relationship?: Recent break up, that pt says led to hospitalization Additional relationship information: NA Are you sexually active?: Yes What is your sexual orientation?: Heterosexual Has your sexual activity been affected by drugs, alcohol, medication, or emotional stress?: None reported Does patient have children?: Yes How many children?: 1 How is patient's relationship with their children?: "Good"  Childhood History:  By whom was/is the patient raised?: Mother Additional childhood history  information: Pt says he was raised by his mother but interacts with his father Description of patient's relationship with caregiver when they were a child: "Strong relationship with my mother" Patient's description of current relationship with people who raised him/her: "Strong relationship with my mother" How were you disciplined when you got in trouble as a child/adolescent?: "I don't remember" Does patient have siblings?: Yes Number of Siblings: 1 Description of patient's current relationship with siblings: Pt reports having a half sister Did patient suffer any verbal/emotional/physical/sexual abuse as a child?: No Did patient suffer from severe childhood neglect?: No Has patient ever been sexually abused/assaulted/raped as an adolescent or adult?: No Was the patient ever a victim of a crime or a disaster?: No Witnessed domestic violence?: No Has patient been effected by domestic violence as an adult?: No  Education:  Highest grade of school patient has completed: Some college Currently a student?: No Learning disability?: No  Employment/Work Situation:   Employment situation: Employed 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 How long has patient been employed?: Since April 2019 Patient's job has been impacted by current illness: (Pt denies) What is the longest time patient has a held a job?: 2.5 yrs Where was the patient employed at that time?: Psychologist, occupational and [employer] Did You Receive Any Psychiatric Treatment/Services While in the [employer]?: No Are There Guns or Other Weapons in Your Home?: No Are These Weapons Safely Secured?: (Pt denies access to guns or weapons)  Architect:   Financial resources: Income from employment Does patient have a representative payee or guardian?: No  Alcohol/Substance Abuse:   What has been your use of drugs/alcohol within the last 12 months?: Pt denies any drug use; occassionally drinks alcohol If attempted suicide,  did drugs/alcohol play a role in this?: No Alcohol/Substance Abuse Treatment Hx: Denies  past history Has alcohol/substance abuse ever caused legal problems?: No  Social Support System:   Conservation officer, natureatient's Community Support System: Fair Describe Community Support System: Mother Type of faith/religion: None reported How does patient's faith help to cope with current illness?: None reported  Leisure/Recreation:   Leisure and Hobbies: Playing with daughter  Strengths/Needs:   What is the patient's perception of their strengths?: "Compassionate, sympathetic" Patient states they can use these personal strengths during their treatment to contribute to their recovery: "Compassionate, sympathetic" Patient states these barriers may affect/interfere with their treatment: None reported Patient states these barriers may affect their return to the community: None reported Other important information patient would like considered in planning for their treatment: NA  Discharge Plan:   Currently receiving community mental health services: No Patient states concerns and preferences for aftercare planning are: Pt states he prefers to use his EAP for f/u care. 1/14 f/u appt with Luvenia Reddenina, P.A. for medication management; says he will f/u at [facility] for opt. Patient states they will know when they are safe and ready for discharge when: Pt states he does not feel like the Paxil he is prescribed is effective and "I'm ready to get home to my little girl" Does patient have access to transportation?: Yes Does patient have financial barriers related to discharge medications?: No Patient description of barriers related to discharge medications: NA Will patient be returning to same living situation after discharge?: Yes  Summary/Recommendations:   Summary and Recommendations (to be completed by the evaluator): Patient is a 23 year old Caucasian male brought to the ED for attempted overdose. Pt reports a recent break up  with his girlfriend led to hospitalization. Pt did not provide details about the break up. Pt says he had not seen his 2610 mth old daughter in a week, child resides with girlfriend. Chart review reveals pt has been dealing with depressive sxs for a month. Pt denies any drug use and says he drinks alcohol occasionally. Pt works for the [employer] and says he would like to utilize his EAP for f/u care. On 1/14 pt says he has a f/u appointment with Inetta Fermoina, P.A.(unable to recall last name or location) for medication management. Per pt report, Inetta Fermoina prescribed the Paxil he takes but does not find it effective. Pt reports he will f/u with [facility] for opt through the EAP.  At discharge, patient will return home and attend outpatient treatment. While here, patient will benefit from crisis stabilization, medication evaluation, group therapy and psychoeducation. In addition, it is recommended that patient remain compliant with the established discharge plan and continue treatment.   Jaydenn Boccio Philip Aspen Jazon Jipson. 02/17/2018

## 2018-02-17 NOTE — H&P (Signed)
Psychiatric Admission Assessment Adult  Patient Identification: Allen Wilson MRN:  [id number] Date of Evaluation:  02/17/2018 Chief Complaint:  Severe major depression, single episode Principal Diagnosis: Severe major depression, single episode ([place]) Diagnosis:  Principal Problem:   Severe major depression, single episode ([place]) Active Problems:   Drug overdose  History of Present Illness: Patient admitted in transfer from the medical service.  Presented to the hospital after an overdose of medication with suicidal intent.  Patient has been suffering from depression for over a month.  Depressed mood feelings of hopelessness and helplessness all present.  Poor sleep and poor energy.  No psychotic symptoms.  Patient is today denying any suicidal intent or plan but still is blunted and flat.  Patient had been tried on Paxil for a couple weeks which was not appearing to provide any benefit and may have been making him feel worse.  Not seeing a therapist. Associated Signs/Symptoms: Depression Symptoms:  depressed mood, anhedonia, insomnia, difficulty concentrating, hopelessness, (Hypo) Manic Symptoms:  None Anxiety Symptoms:  None Psychotic Symptoms:  None PTSD Symptoms: Negative Total Time spent with patient: 1 hour  Past Psychiatric History: No past psychiatric treatment before this episode.  Was receiving Paxil from primary care doctor.  No previous hospitalizations no previous suicide attempts.  No history of substance abuse problem  Is the patient at risk to self? Yes.    Has the patient been a risk to self in the past 6 months? No.  Has the patient been a risk to self within the distant past? No.  Is the patient a risk to others? No.  Has the patient been a risk to others in the past 6 months? No.  Has the patient been a risk to others within the distant past? No.   Prior Inpatient Therapy:   Prior Outpatient Therapy:    Alcohol Screening: 1. How often do you have a  drink containing alcohol?: Monthly or less 2. How many drinks containing alcohol do you have on a typical day when you are drinking?: 3 or 4 3. How often do you have six or more drinks on one occasion?: Never AUDIT-C Score: 2 4. How often during the last year have you found that you were not able to stop drinking once you had started?: Less than monthly 5. How often during the last year have you failed to do what was normally expected from you becasue of drinking?: Never 6. How often during the last year have you needed a first drink in the morning to get yourself going after a heavy drinking session?: Never 7. How often during the last year have you had a feeling of guilt of remorse after drinking?: Never 8. How often during the last year have you been unable to remember what happened the night before because you had been drinking?: Never 9. Have you or someone else been injured as a result of your drinking?: No 10. Has a relative or friend or a doctor or another health worker been concerned about your drinking or suggested you cut down?: No Alcohol Use Disorder Identification Test Final Score (AUDIT): 3 Intervention/Follow-up: Alcohol Education Substance Abuse History in the last 12 months:  No. Consequences of Substance Abuse: Negative Previous Psychotropic Medications: Yes  Psychological Evaluations: No  Past Medical History:  Past Medical History:  Diagnosis Date  . Tachycardia     Past Surgical History:  Procedure Laterality Date  . NO PAST SURGERIES     Family History:  Family History  Problem Relation Age of Onset  . Other Mother        alive & well.  . Hypertension Father   . Heart attack Maternal Grandfather        x 2 - first in his 56's.   Family Psychiatric  History: None reported Tobacco Screening: Have you used any form of tobacco in the last 30 days? (Cigarettes, Smokeless Tobacco, Cigars, and/or Pipes): No Social History:  Social History   Substance and  Sexual Activity  Alcohol Use No     Social History   Substance and Sexual Activity  Drug Use No    Additional Social History: Marital status: Long term relationship Long term relationship, how long?: 2.5 yrs What types of issues is patient dealing with in the relationship?: Recent break up, that pt says led to hospitalization Additional relationship information: NA Are you sexually active?: Yes What is your sexual orientation?: Heterosexual Has your sexual activity been affected by drugs, alcohol, medication, or emotional stress?: None reported Does patient have children?: Yes How many children?: 1 How is patient's relationship with their children?: "Good"                         Allergies:  No Known Allergies Lab Results:  Results for orders placed or performed during the hospital encounter of 02/15/18 (from the past 48 hour(s))  Glucose, capillary     Status: Abnormal   Collection Time: 02/15/18  7:32 PM  Result Value Ref Range   Glucose-Capillary 110 (H) 70 - 99 mg/dL  Acetaminophen level     Status: Abnormal   Collection Time: 02/15/18  7:46 PM  Result Value Ref Range   Acetaminophen (Tylenol), Serum <10 (L) 10 - 30 ug/mL    Comment: (NOTE) Therapeutic concentrations vary significantly. A range of 10-30 ug/mL  may be an effective concentration for many patients. However, some  are best treated at concentrations outside of this range. Acetaminophen concentrations >150 ug/mL at 4 hours after ingestion  and >50 ug/mL at 12 hours after ingestion are often associated with  toxic reactions. Performed at [facility], [address] [place], [place] [address]   Salicylate level     Status: None   Collection Time: 02/15/18  7:46 PM  Result Value Ref Range   Salicylate Lvl <6.2 2.8 - 30.0 mg/dL    Comment: Performed at [facility], [address] [place], [place] [address]  CBC with Differential     Status: Abnormal   Collection Time:  02/15/18  7:46 PM  Result Value Ref Range   WBC 16.5 (H) 4.0 - 10.5 K/uL   RBC 6.09 (H) 4.22 - 5.81 MIL/uL   Hemoglobin 17.7 (H) 13.0 - 17.0 g/dL   HCT 55.7 (H) 39.0 - 52.0 %   MCV 91.5 80.0 - 100.0 fL   MCH 29.1 26.0 - 34.0 pg   MCHC 31.8 30.0 - 36.0 g/dL   RDW 11.6 11.5 - 15.5 %   Platelets 313 150 - 400 K/uL   nRBC 0.0 0.0 - 0.2 %   Neutrophils Relative % 59 %   Neutro Abs 9.8 (H) 1.7 - 7.7 K/uL   Lymphocytes Relative 33 %   Lymphs Abs 5.4 (H) 0.7 - 4.0 K/uL   Monocytes Relative 7 %   Monocytes Absolute 1.2 (H) 0.1 - 1.0 K/uL   Eosinophils Relative 0 %   Eosinophils Absolute 0.0 0.0 - 0.5 K/uL   Basophils Relative 1 %  Basophils Absolute 0.1 0.0 - 0.1 K/uL   Immature Granulocytes 0 %   Abs Immature Granulocytes 0.07 0.00 - 0.07 K/uL    Comment: Performed at [facility], [address] [place], [place] [address]  Ethanol     Status: None   Collection Time: 02/15/18  7:46 PM  Result Value Ref Range   Alcohol, Ethyl (B) <10 <10 mg/dL    Comment: (NOTE) Lowest detectable limit for serum alcohol is 10 mg/dL. For medical purposes only. Performed at [facility], [address] [place], [place] [address]   Comprehensive metabolic panel     Status: Abnormal   Collection Time: 02/15/18  7:46 PM  Result Value Ref Range   Sodium 142 135 - 145 mmol/L    Comment: LYTES REPEATED   Potassium 3.0 (L) 3.5 - 5.1 mmol/L   Chloride 103 98 - 111 mmol/L   CO2 11 (L) 22 - 32 mmol/L   Glucose, Bld 103 (H) 70 - 99 mg/dL   BUN 17 6 - 20 mg/dL   Creatinine, Ser 1.27 (H) 0.61 - 1.24 mg/dL   Calcium 10.3 8.9 - 10.3 mg/dL   Total Protein 9.9 (H) 6.5 - 8.1 g/dL   Albumin 5.9 (H) 3.5 - 5.0 g/dL   AST 42 (H) 15 - 41 U/L   ALT 32 0 - 44 U/L   Alkaline Phosphatase 80 38 - 126 U/L   Total Bilirubin 0.8 0.3 - 1.2 mg/dL   GFR calc non Af Amer >60 >60 mL/min   GFR calc Af Amer >60 >60 mL/min   Anion gap 28 (H) 5 - 15    Comment: Performed at [facility], [address] [place], [place] [address]  Protime-INR     Status: None   Collection Time: 02/15/18  7:46 PM  Result Value Ref Range   Prothrombin Time 14.1 11.4 - 15.2 seconds   INR 1.10     Comment: Performed at [facility], [address] [place], [place] [address]  Blood gas, venous     Status: Abnormal   Collection Time: 02/15/18  8:00 PM  Result Value Ref Range   FIO2 1.00    Delivery systems NON-REBREATHER OXYGEN MASK    pH, Ven 7.24 (L) 7.250 - 7.430   pCO2, Ven 39 (L) 44.0 - 60.0 mmHg   pO2, Ven 189.0 (H) 32.0 - 45.0 mmHg   Bicarbonate 16.7 (L) 20.0 - 28.0 mmol/L   Acid-base deficit 10.1 (H) 0.0 - 2.0 mmol/L   O2 Saturation 99.5 %   Patient temperature 37.0    Collection site VENOUS    Sample type VENOUS     Comment: Performed at [facility], [address], [place], [place] [address]  Salicylate level     Status: None   Collection Time: 02/15/18  9:23 PM  Result Value Ref Range   Salicylate Lvl <6.0 2.8 - 30.0 mg/dL    Comment: Performed at [facility], [place], [place] [address]  Acetaminophen level     Status: Abnormal   Collection Time: 02/15/18  9:23 PM  Result Value Ref Range   Acetaminophen (Tylenol), Serum <10 (L) 10 - 30 ug/mL    Comment: (NOTE) Therapeutic concentrations vary significantly. A range of 10-30 ug/mL  may be an effective concentration for many patients. However, some  are best treated at concentrations outside of this range. Acetaminophen concentrations >150 ug/mL at 4 hours after ingestion  and >50 ug/mL  at 12 hours after ingestion are often associated with  toxic reactions. Performed at [facility], [place], [place] [address]   Acetaminophen level     Status: Abnormal   Collection Time: 02/15/18 10:25 PM  Result Value Ref Range   Acetaminophen (Tylenol), Serum <10 (L) 10 - 30 ug/mL    Comment: (NOTE) Therapeutic concentrations vary significantly. A range of 10-30  ug/mL  may be an effective concentration for many patients. However, some  are best treated at concentrations outside of this range. Acetaminophen concentrations >150 ug/mL at 4 hours after ingestion  and >50 ug/mL at 12 hours after ingestion are often associated with  toxic reactions. Performed at [facility], [address] [place], [place] [address]   Salicylate level     Status: None   Collection Time: 02/15/18 10:25 PM  Result Value Ref Range   Salicylate Lvl <2.6 2.8 - 30.0 mg/dL    Comment: Performed at [facility], [address] [place], [place] [address]  HIV antibody (Routine Testing)     Status: None   Collection Time: 02/16/18  9:22 AM  Result Value Ref Range   HIV Screen 4th Generation wRfx Non Reactive Non Reactive    Comment: (NOTE) Performed At: [facility] [place], [place] [id number] Rush Farmer MD IN:[id number]   CBC     Status: None   Collection Time: 02/16/18  9:22 AM  Result Value Ref Range   WBC 10.0 4.0 - 10.5 K/uL   RBC 4.96 4.22 - 5.81 MIL/uL   Hemoglobin 14.6 13.0 - 17.0 g/dL   HCT 43.4 39.0 - 52.0 %   MCV 87.5 80.0 - 100.0 fL   MCH 29.4 26.0 - 34.0 pg   MCHC 33.6 30.0 - 36.0 g/dL   RDW 11.9 11.5 - 15.5 %   Platelets 235 150 - 400 K/uL   nRBC 0.0 0.0 - 0.2 %    Comment: Performed at [facility], [address] [place], [place] [address]  Basic metabolic panel     Status: Abnormal   Collection Time: 02/16/18  9:22 AM  Result Value Ref Range   Sodium 136 135 - 145 mmol/L   Potassium 3.4 (L) 3.5 - 5.1 mmol/L   Chloride 107 98 - 111 mmol/L   CO2 22 22 - 32 mmol/L   Glucose, Bld 85 70 - 99 mg/dL   BUN 15 6 - 20 mg/dL   Creatinine, Ser 1.00 0.61 - 1.24 mg/dL   Calcium 8.7 (L) 8.9 - 10.3 mg/dL   GFR calc non Af Amer >60 >60 mL/min   GFR calc Af Amer >60 >60 mL/min   Anion gap 7 5 - 15    Comment: Performed at [facility], [address] [place], [place] [address]     Blood Alcohol level:  Lab Results  Component Value Date   ETH <10 02/15/2018   ETH <5 47/65/4650    Metabolic Disorder Labs:  No results found for: HGBA1C, MPG No results found for: PROLACTIN No results found for: CHOL, TRIG, HDL, CHOLHDL, VLDL, LDLCALC  Current Medications: Current Facility-Administered Medications  Medication Dose Route Frequency Provider Last Rate Last Dose  . acetaminophen (TYLENOL) tablet 650 mg  650 mg Oral Q6H PRN Reynard Christoffersen T, MD      . alum & mag hydroxide-simeth (MAALOX/MYLANTA) 200-200-20 MG/5ML suspension 30 mL  30 mL Oral Q4H PRN Adanely Reynoso, Madie Reno, MD      .  LORazepam (ATIVAN) tablet 1 mg  1 mg Oral Q6H PRN Corderro Koloski T, MD      . magnesium hydroxide (MILK OF MAGNESIA) suspension 30 mL  30 mL Oral Daily PRN Chessie Neuharth T, MD      . metoprolol tartrate (LOPRESSOR) tablet 25 mg  25 mg Oral BID PRN Laren Whaling T, MD      . mirtazapine (REMERON) tablet 15 mg  15 mg Oral QHS Dali Kraner T, MD      . temazepam (RESTORIL) capsule 15 mg  15 mg Oral QHS PRN Keante Urizar, Madie Reno, MD       PTA Medications: Medications Prior to Admission  Medication Sig Dispense Refill Last Dose  . metoprolol tartrate (LOPRESSOR) 25 MG tablet Take 1 tablet (25 mg total) by mouth 2 (two) times daily as needed (for heart rate greater than 110). 60 tablet 1 Unknown at PRN    Musculoskeletal: Strength & Muscle Tone: within normal limits Gait & Station: normal Patient leans: N/A  Psychiatric Specialty Exam: Physical Exam  Nursing note and vitals reviewed. Constitutional: He appears well-developed and well-nourished.  HENT:  Head: Normocephalic and atraumatic.  Eyes: Pupils are equal, round, and reactive to light. Conjunctivae are normal.  Neck: Normal range of motion.  Cardiovascular: Normal heart sounds.  Respiratory: Effort normal.  GI: Soft.  Musculoskeletal: Normal range of motion.  Neurological: He is alert.  Skin: Skin is warm and dry.  Psychiatric:  Judgment normal. His speech is delayed. He is slowed. Thought content is not paranoid. Cognition and memory are normal. He exhibits a depressed mood.    Review of Systems  Constitutional: Negative.   HENT: Negative.   Eyes: Negative.   Respiratory: Negative.   Cardiovascular: Negative.   Gastrointestinal: Negative.   Musculoskeletal: Negative.   Skin: Negative.   Neurological: Negative.   Psychiatric/Behavioral: Positive for depression and suicidal ideas. The patient is nervous/anxious and has insomnia.     Blood pressure 137/85, pulse 63, temperature 98.8 F (37.1 C), temperature source Oral, resp. rate 16, height 6' 1"  (1.854 m), weight 103 kg, SpO2 97 %.Body mass index is 29.95 kg/m.  General Appearance: Fairly Groomed  Eye Contact:  Good  Speech:  Slow  Volume:  Decreased  Mood:  Depressed  Affect:  Congruent  Thought Process:  Coherent  Orientation:  Full (Time, Place, and Person)  Thought Content:  Logical  Suicidal Thoughts:  Yes.  without intent/plan  Homicidal Thoughts:  No  Memory:  Immediate;   Fair Recent;   Fair Remote;   Fair  Judgement:  Fair  Insight:  Fair  Psychomotor Activity:  Decreased  Concentration:  Concentration: Fair  Recall:  [employer] of Knowledge:  Fair  Language:  Fair  Akathisia:  No  Handed:  Right  AIMS (if indicated):     Assets:  Desire for Improvement Housing Physical Health Resilience  ADL's:  Intact  Cognition:  WNL  Sleep:  Number of Hours: 7.5    Treatment Plan Summary: Daily contact with patient to assess and evaluate symptoms and progress in treatment, Medication management and Plan Continue 15-minute checks.  Treatment team met today.  Initiate mirtazapine 15 mg at night as treatment of depression.  Engage in individual and group therapy.  Reassess for improvement in symptoms and arrange discharge planning before he leaves  Observation Level/Precautions:  15 minute checks  Laboratory:  HbAIC  Psychotherapy:     Medications:    Consultations:    Discharge Concerns:  Estimated LOS:  Other:     Physician Treatment Plan for Primary Diagnosis: Severe major depression, single episode ([place]) Long Term Goal(s): Improvement in symptoms so as ready for discharge  Short Term Goals: Ability to verbalize feelings will improve and Ability to disclose and discuss suicidal ideas  Physician Treatment Plan for Secondary Diagnosis: Principal Problem:   Severe major depression, single episode ([place]) Active Problems:   Drug overdose  Long Term Goal(s): Improvement in symptoms so as ready for discharge  Short Term Goals: Ability to maintain clinical measurements within normal limits will improve and Compliance with prescribed medications will improve  I certify that inpatient services furnished can reasonably be expected to improve the patient's condition.    Alethia Berthold, MD 1/1/20206:41 PM

## 2018-02-17 NOTE — Progress Notes (Signed)
Recreation Therapy Notes  Date: 02/17/2018  Time: 9:30 am   Location: Craft room   Behavioral response: N/A   Intervention Topic: Time Management  Discussion/Intervention: Patient did not attend group.   Clinical Observations/Feedback:  Patient did not attend group.   Vernell Back LRT/CTRS        Lorean Ekstrand 02/17/2018 11:36 AM

## 2018-02-17 NOTE — Progress Notes (Signed)
D - Patient was in his room upon arrival to the unit. Patient was pleasant during assessment and medication administration. Patient denies SI/HI/AVH, pain and anxiety. Patient stated his depression was 5/10. Patient stated, "I feel like I am ready to get out of here soon." Patient given education about treatment and discharge.   A - Patient compliant with medication administration per MD orders and procedures on the unit. Patient given education. Patient given support and encouragement to be active in his treatment plan. Patient informed to let staff know if there are any issues or problems on the unit.   R - Patient being monitored Q 15 minutes for safety per unit protocol. Patient remains safe on the unit at this time.

## 2018-02-17 NOTE — Plan of Care (Signed)
Patient compliant with medication administration per MD orders and procedures on the unit. Patient says he wants to learn coping mechanisms to deal with his depression. Patient wants to learn so he can be discharged.   Problem: Medication: Goal: Compliance with prescribed medication regimen will improve Outcome: Progressing   Problem: Education: Goal: Ability to state activities that reduce stress will improve Outcome: Progressing

## 2018-02-17 NOTE — Plan of Care (Signed)
Patient up in the milieu interacting with select peers. Endorses depression, but denies SI/HI/AVH and pain at this time. Compliant with treatment plan. Did not attend morning group, will encourage patient to attend afternoon group. Milieu remains safe with q 15 minute safety safety.

## 2018-02-17 NOTE — [facility] Suicide Risk Assessment (Signed)
[facility]   Nursing information obtained from:  Patient Demographic factors:  Male Current Mental Status:  Suicide plan Loss Factors:  NA Historical Factors:  NA Risk Reduction Factors:  Positive therapeutic relationship  Total Time spent with patient: 1 hour Principal Problem: Severe major depression, single episode (HCC) Diagnosis:  Principal Problem:   Severe major depression, single episode (HCC) Active Problems:   Drug overdose  Subjective Data: Patient admitted in transfer from the medical service.  The patient had been admitted after an overdose of multiple medicines with suicidal intent.  Patient denies any suicidal ideation today although he continues to endorse multiple symptoms of depression.  Not reporting any psychotic symptoms.  Does express an openness to treatment and is optimistic about the future.  Continued Clinical Symptoms:  Alcohol Use Disorder Identification Test Final Score (AUDIT): 3 The "Alcohol Use Disorders Identification Test", Guidelines for Use in Primary Care, Second Edition.  World Science writerHealth Organization [facility](WHO). Score between 0-7:  no or low risk or alcohol related problems. Score between 8-15:  moderate risk of alcohol related problems. Score between 16-19:  high risk of alcohol related problems. Score 20 or above:  warrants further diagnostic evaluation for alcohol dependence and treatment.   CLINICAL FACTORS:   Depression:   Hopelessness   Musculoskeletal: Strength & Muscle Tone: within normal limits Gait & Station: unsteady Patient leans: N/A  Psychiatric Specialty Exam: Physical Exam  Review of Systems  Constitutional: Negative.   HENT: Negative.   Eyes: Negative.   Respiratory: Negative.   Cardiovascular: Negative.   Gastrointestinal: Negative.   Musculoskeletal: Negative.   Skin: Negative.   Neurological: Negative.   Psychiatric/Behavioral: Positive for depression and suicidal ideas. Negative for  hallucinations, memory loss and substance abuse. The patient has insomnia. The patient is not nervous/anxious.     Blood pressure 137/85, pulse 63, temperature 98.8 F (37.1 C), temperature source Oral, resp. rate 16, height 6\' 1"  (1.854 m), weight 103 kg, SpO2 97 %.Body mass index is 29.95 kg/m.  General Appearance: Fairly Groomed  Eye Contact:  Good  Speech:  Slow  Volume:  Decreased  Mood:  Depressed  Affect:  Congruent  Thought Process:  Coherent  Orientation:  Full (Time, Place, and Person)  Thought Content:  Logical  Suicidal Thoughts:  No  Homicidal Thoughts:  No  Memory:  Immediate;   Fair Recent;   Fair Remote;   Fair  Judgement:  Fair  Insight:  Fair  Psychomotor Activity:  Decreased  Concentration:  Concentration: Fair  Recall:  [employer]Fair  Fund of Knowledge:  Fair  Language:  Fair  Akathisia:  No  Handed:  Right  AIMS (if indicated):     Assets:  Communication Skills Desire for Improvement Housing Physical Health  ADL's:  Intact  Cognition:  WNL  Sleep:  Number of Hours: 7.5      COGNITIVE FEATURES THAT CONTRIBUTE TO RISK:  Loss of executive function    SUICIDE RISK:   Mild:  Suicidal ideation of limited frequency, intensity, duration, and specificity.  There are no identifiable plans, no associated intent, mild dysphoria and related symptoms, good self-control (both objective and subjective assessment), few other risk factors, and identifiable protective factors, including available and accessible social support.  PLAN OF CARE: Patient will be involved in unit activities.  Begin medication.  Ongoing assessment by treatment team with outpatient plan before discharge  I certify that inpatient services furnished can reasonably be expected to improve the patient's condition.  Allen RasmussenJohn Latoshia Monrroy, MD 02/17/2018, 6:38 PM

## 2018-02-17 NOTE — Tx Team (Addendum)
Interdisciplinary Treatment and Diagnostic Plan Update  02/17/2018 Time of Session: [place] MRN: [id number]  Principal Diagnosis: <principal problem not specified>  Secondary Diagnoses: Active Problems:   Severe major depression, single episode (HCC)   Current Medications:  Current Facility-Administered Medications  Medication Dose Route Frequency Provider Last Rate Last Dose  . acetaminophen (TYLENOL) tablet 650 mg  650 mg Oral Q6H PRN Clapacs, John T, MD      . alum & mag hydroxide-simeth (MAALOX/MYLANTA) 200-200-20 MG/5ML suspension 30 mL  30 mL Oral Q4H PRN Clapacs, John T, MD      . LORazepam (ATIVAN) tablet 1 mg  1 mg Oral Q6H PRN Clapacs, John T, MD      . magnesium hydroxide (MILK OF MAGNESIA) suspension 30 mL  30 mL Oral Daily PRN Clapacs, John T, MD      . metoprolol tartrate (LOPRESSOR) tablet 25 mg  25 mg Oral BID PRN Clapacs, John T, MD      . temazepam (RESTORIL) capsule 15 mg  15 mg Oral QHS PRN Clapacs, Madie Reno, MD       PTA Medications: Medications Prior to Admission  Medication Sig Dispense Refill Last Dose  . metoprolol tartrate (LOPRESSOR) 25 MG tablet Take 1 tablet (25 mg total) by mouth 2 (two) times daily as needed (for heart rate greater than 110). 60 tablet 1 Unknown at PRN    Patient Stressors: Financial difficulties Marital or family conflict Medication change or noncompliance  Patient Strengths: Average or above average intelligence Capable of independent living Communication skills Supportive family/friends  Treatment Modalities: Medication Management, Group therapy, Case management,  1 to 1 session with clinician, Psychoeducation, Recreational therapy.   Physician Treatment Plan for Primary Diagnosis: <principal problem not specified> Long Term Goal(s):     Short Term Goals:    Medication Management: Evaluate patient's response, side effects, and tolerance of medication regimen.  Therapeutic Interventions: 1 to 1 sessions,  Unit Group sessions and Medication administration.  Evaluation of Outcomes: Not Met  Physician Treatment Plan for Secondary Diagnosis: Active Problems:   Severe major depression, single episode ([place])  Long Term Goal(s):     Short Term Goals:       Medication Management: Evaluate patient's response, side effects, and tolerance of medication regimen.  Therapeutic Interventions: 1 to 1 sessions, Unit Group sessions and Medication administration.  Evaluation of Outcomes: Not Met   RN Treatment Plan for Primary Diagnosis: <principal problem not specified> Long Term Goal(s): Knowledge of disease and therapeutic regimen to maintain health will improve  Short Term Goals: Ability to verbalize feelings will improve, Ability to disclose and discuss suicidal ideas, Ability to identify and develop effective coping behaviors will improve and Compliance with prescribed medications will improve  Medication Management: RN will administer medications as ordered by provider, will assess and evaluate patient's response and provide education to patient for prescribed medication. RN will report any adverse and/or side effects to prescribing provider.  Therapeutic Interventions: 1 on 1 counseling sessions, Psychoeducation, Medication administration, Evaluate responses to treatment, Monitor vital signs and CBGs as ordered, Perform/monitor CIWA, COWS, AIMS and Fall Risk screenings as ordered, Perform wound care treatments as ordered.  Evaluation of Outcomes: Not Met   LCSW Treatment Plan for Primary Diagnosis: <principal problem not specified> Long Term Goal(s): Safe transition to appropriate next level of care at discharge, Engage patient in therapeutic group addressing interpersonal concerns.  Short Term Goals: Engage patient in aftercare planning with referrals and resources  Therapeutic Interventions:  Assess for all discharge needs, 1 to 1 time with Social worker, Explore available resources and  support systems, Assess for adequacy in community support network, Educate family and significant other(s) on suicide prevention, Complete Psychosocial Assessment, Interpersonal group therapy.  Evaluation of Outcomes: Not Met   Progress in Treatment: Attending groups: No. Participating in groups: No. Taking medication as prescribed: Yes. Toleration medication: Yes. Family/Significant other contact made: No, will contact:  Marcha Dutton, mother Patient understands diagnosis: Yes. Discussing patient identified problems/goals with staff: Yes. Medical problems stabilized or resolved: Yes. Denies suicidal/homicidal ideation: Yes. Issues/concerns per patient self-inventory: No. Other: NA  New problem(s) identified: No, Describe:  None reported  New Short Term/Long Term Goal(s): "Be mentally stable"  Patient Goals:  "Be mentally stable"  Discharge Plan or Barriers: Pt will return home and follow up with outpatient treatment  Reason for Continuation of Hospitalization: Medication stabilization  Estimated Length of Stay: 3-5 days  Recreational Therapy: Patient Stressors: Relationship Patient Goal: Patient will engage in groups without prompting or encouragement from LRT x3 group sessions within 5 recreation therapy group sessions  Attendees: Patient: Allen Wilson 02/17/2018 11:47 AM  Physician: Alethia Berthold, MD 02/17/2018 11:47 AM  Nursing:  02/17/2018 11:47 AM  RN Care Manager: 02/17/2018 11:47 AM  Social Worker: Sanjuana Kava LCSW 02/17/2018 11:47 AM  Recreational Therapist: Roanna Epley, [place] 02/17/2018 11:47 AM  Other: Assunta Curtis LCSW 02/17/2018 11:47 AM  Other:  02/17/2018 11:47 AM  Other: 02/17/2018 11:47 AM    Scribe for Treatment Team: Yvette Rack, LCSW 02/17/2018 11:47 AM

## 2018-02-17 NOTE — Progress Notes (Signed)
Recreation Therapy Notes  INPATIENT RECREATION THERAPY ASSESSMENT  Patient Details Name: Allen Wilson MRN: [id number] DOB: 1995-10-15 Today's Date: 02/17/2018       Information Obtained From: Patient  Able to Participate in Assessment/Interview: Yes  Patient Presentation: Responsive  Reason for Admission (Per Patient): Active Symptoms, Suicidal Ideation, Suicide Attempt  Patient Stressors: Relationship  Coping Skills:   (I do not have any)  Leisure Interests (2+):  Social - Family, Exercise - Paediatric nurse  Frequency of Recreation/Participation: Weekly  Awareness of Community Resources:  Yes  Community Resources:  [employer]  Current Use:    If no, Barriers?:    Expressed Interest in [employer] Information:    [place] of Residence:  Film/video editor   Patient Main Form of Transportation: Set designer  Patient Strengths:  Good sense of humor  Patient Identified Areas of Improvement:  My self-esteem  Patient Goal for Hospitalization:  To get out  Current SI (including self-harm):  No  Current HI:  No  Current AVH: No  Staff Intervention Plan: Group Attendance, Collaborate with Interdisciplinary Treatment Team  Consent to Intern Participation: N/A  Samani Deal 02/17/2018, 3:22 PM

## 2018-02-18 MED ORDER — MIRTAZAPINE 15 MG PO TABS
30.0000 mg | ORAL_TABLET | Freq: Every day | ORAL | Status: DC
  Administered 2018-02-18: 30 mg via ORAL
  Filled 2018-02-18: qty 2

## 2018-02-18 MED ORDER — MIRTAZAPINE 30 MG PO TABS: 30.0000 mg | ORAL_TABLET | Freq: Every day | ORAL | 1 refills | Status: DC

## 2018-02-18 MED ORDER — METOPROLOL TARTRATE 25 MG PO TABS: 25.0000 mg | ORAL_TABLET | Freq: Two times a day (BID) | ORAL | 0 refills | Status: DC | PRN

## 2018-02-18 NOTE — [facility] Suicide Risk Assessment (Signed)
[facility]   Principal Problem: Severe major depression, single episode (HCC) Discharge Diagnoses: Principal Problem:   Severe major depression, single episode (HCC) Active Problems:   Drug overdose   Total Time spent with patient: 45 minutes  Musculoskeletal: Strength & Muscle Tone: within normal limits Gait & Station: normal Patient leans: N/A  Psychiatric Specialty Exam: ROS  Blood pressure 126/88, pulse (!) 103, temperature 98.2 F (36.8 C), temperature source Oral, resp. rate 18, height 6\' 1"  (1.854 m), weight 103 kg, SpO2 97 %.Body mass index is 29.95 kg/m.  General Appearance: Casual  Eye Contact::  Good  Speech:  Clear and Coherent409  Volume:  Normal  Mood:  Dysphoric  Affect:  Congruent  Thought Process:  Goal Directed  Orientation:  Full (Time, Place, and Person)  Thought Content:  Logical  Suicidal Thoughts:  No  Homicidal Thoughts:  No  Memory:  Immediate;   Fair Recent;   Fair Remote;   Fair  Judgement:  Fair  Insight:  Fair  Psychomotor Activity:  Normal  Concentration:  Fair  Recall:  F[employer]Fair  Fund of Knowledge:Fair  Language: Fair  Akathisia:  No  Handed:  Right  AIMS (if indicated):     Assets:  Communication Skills Desire for Improvement Financial Resources/Insurance Housing Physical Health Resilience  Sleep:  Number of Hours: 7.5  Cognition: WNL  ADL's:  Intact   Mental Status Per Nursing Assessment::   On Admission:  Suicide plan  Demographic Factors:  Male, Adolescent or young adult, Caucasian and Living alone  Loss Factors: Loss of significant relationship  Historical Factors: Impulsivity  Risk Reduction Factors:   Responsible for children under 22 years of age, Sense of responsibility to family, Religious beliefs about death, Positive social support and Positive therapeutic relationship  Continued Clinical Symptoms:  Depression:   Anhedonia  Cognitive Features That Contribute To Risk:   Closed-mindedness    Suicide Risk:  Minimal: No identifiable suicidal ideation.  Patients presenting with no risk factors but with morbid ruminations; may be classified as minimal risk based on the severity of the depressive symptoms  Follow-up Information    [facility]. Go on 02/24/2018.   Why:  Please follow up at [facility] on Wednesday, February 24, 2018 at 11am; arrive 15min early to complete paperwork. You are scheduled to meet with Eliot FordSusan Carpenter. Thank you. Contact information: [address],  [place], [place] [address] [phone]          Plan Of Care/Follow-up recommendations:  Activity:  Activity as tolerated Diet:  Regular diet Other:  Follow-up as described above  Mordecai RasmussenJohn Murry Diaz, MD 02/18/2018, 3:59 PM

## 2018-02-18 NOTE — [facility] Group Notes (Signed)
LCSW Group Therapy Note  02/18/2018 1:00 PM  Type of Therapy/Topic:  Group Therapy:  Balance in Life  Participation Level:  Active  Description of Group:    This group will address the concept of balance and how it feels and looks when one is unbalanced. Patients will be encouraged to process areas in their lives that are out of balance and identify reasons for remaining unbalanced. Facilitators will guide patients in utilizing problem-solving interventions to address and correct the stressor making their life unbalanced. Understanding and applying boundaries will be explored and addressed for obtaining and maintaining a balanced life. Patients will be encouraged to explore ways to assertively make their unbalanced needs known to significant others in their lives, using other group members and facilitator for support and feedback.  Therapeutic Goals: 1. Patient will identify two or more emotions or situations they have that consume much of in their lives. 2. Patient will identify signs/triggers that life has become out of balance:  3. Patient will identify two ways to set boundaries in order to achieve balance in their lives:  4. Patient will demonstrate ability to communicate their needs through discussion and/or role plays  Summary of Patient Progress: Patient was active in group and supportive of other group members.  Patient identified several ways that he is able to utilize effecting coping skills and self care to support a healthy balance in his life.  Patient identified why balance is important.      Therapeutic Modalities:   Cognitive Behavioral Therapy Solution-Focused Therapy Assertiveness Training  Penni Homans MSW, LCSW 02/18/2018 1:51 PM

## 2018-02-18 NOTE — Plan of Care (Signed)
Patient is alert and oriented. Patient denies SI, HI and AVH. Patient rates pain 0/10. Patient expressed he has no concerns to address with Psychiatrist or RN. Patient is pleasant, and dress is appropriate. Patient isolates to room, stated LBM 02/17/2018. Patient states," I am just waiting to go home." No self injurious behaviors noted. Safety checks to continue Q 15 minutes. Problem: Education: Goal: Ability to make informed decisions regarding treatment will improve Outcome: Progressing   Problem: Coping: Goal: Coping ability will improve Outcome: Progressing   Problem: Medication: Goal: Compliance with prescribed medication regimen will improve Outcome: Progressing   Problem: Self-Concept: Goal: Ability to disclose and discuss suicidal ideas will improve Outcome: Progressing Goal: Will verbalize positive feelings about self Outcome: Progressing   Problem: Education: Goal: Utilization of techniques to improve thought processes will improve Outcome: Progressing Goal: Knowledge of the prescribed therapeutic regimen will improve Outcome: Progressing

## 2018-02-18 NOTE — Progress Notes (Signed)
[facility] MD Progress Note  02/18/2018 3:54 PM Allen Wilson  MRN:  [id number] Subjective: Follow-up for this young man who came into the hospital after a suicide attempt.  Patient seen and interviewed chart reviewed.  Patient is neatly dressed and groomed today.  He opened up and spoke with me about how losing his girlfriend probably was a result of her reaction to a lot of his behavior problems.  He admits that he has been having mood instability with a lot of anger and depression for months but it has gotten worse.  He does not report any psychotic symptoms.  He denies today having any suicidal ideation.  He has good insight and judgment.  He tells me that he has wondered in the past if he had bipolar disorder although he has never been diagnosed with it.  Does not describe anything that sounds like a true manic episode.  No side effects of medicine. Principal Problem: Severe major depression, single episode (HCC) Diagnosis: Principal Problem:   Severe major depression, single episode (HCC) Active Problems:   Drug overdose  Total Time spent with patient: 30 minutes  Past Psychiatric History: Patient came into the hospital after a suicide attempt having been started on Paxil.  No previous hospitalizations no other known suicide attempts  Past Medical History:  Past Medical History:  Diagnosis Date  . Tachycardia     Past Surgical History:  Procedure Laterality Date  . NO PAST SURGERIES     Family History:  Family History  Problem Relation Age of Onset  . Other Mother        alive & well.  . Hypertension Father   . Heart attack Maternal Grandfather        x 2 - first in his 73's.   Family Psychiatric  History: Not identified Social History:  Social History   Substance and Sexual Activity  Alcohol Use No     Social History   Substance and Sexual Activity  Drug Use No    Social History   Socioeconomic History  . Marital status: Single    Spouse name: Not on file  .  Number of children: Not on file  . Years of education: Not on file  . Highest education level: Not on file  Occupational History  . Occupation: EMT  Social Needs  . Financial resource strain: Not on file  . Food insecurity:    Worry: Not on file    Inability: Not on file  . Transportation needs:    Medical: Not on file    Non-medical: Not on file  Tobacco Use  . Smoking status: Never Smoker  . Smokeless tobacco: Current User    Types: Chew  Substance and Sexual Activity  . Alcohol use: No  . Drug use: No  . Sexual activity: Not on file  Lifestyle  . Physical activity:    Days per week: Not on file    Minutes per session: Not on file  . Stress: Not on file  Relationships  . Social connections:    Talks on phone: Not on file    Gets together: Not on file    Attends religious service: Not on file    Active member of club or organization: Not on file    Attends meetings of clubs or organizations: Not on file    Relationship status: Not on file  Other Topics Concern  . Not on file  Social History Narrative   Works full time as  EMT.  Lives with mother.  Not currently routinely exercising but used to run cross country and play basketball in [employer].   Additional Social History:                         Sleep: Fair  Appetite:  Fair  Current Medications: Current Facility-Administered Medications  Medication Dose Route Frequency Provider Last Rate Last Dose  . acetaminophen (TYLENOL) tablet 650 mg  650 mg Oral Q6H PRN Clapacs, John T, MD      . alum & mag hydroxide-simeth (MAALOX/MYLANTA) 200-200-20 MG/5ML suspension 30 mL  30 mL Oral Q4H PRN Clapacs, John T, MD      . LORazepam (ATIVAN) tablet 1 mg  1 mg Oral Q6H PRN Clapacs, John T, MD      . magnesium hydroxide (MILK OF MAGNESIA) suspension 30 mL  30 mL Oral Daily PRN Clapacs, John T, MD      . metoprolol tartrate (LOPRESSOR) tablet 25 mg  25 mg Oral BID PRN Clapacs, John T, MD      . mirtazapine (REMERON)  tablet 30 mg  30 mg Oral QHS Clapacs, John T, MD      . temazepam (RESTORIL) capsule 15 mg  15 mg Oral QHS PRN Clapacs, Jackquline DenmarkJohn T, MD        Lab Results: No results found for this or any previous visit (from the past 48 hour(s)).  Blood Alcohol level:  Lab Results  Component Value Date   ETH <10 02/15/2018   ETH <5 09/17/2016    Metabolic Disorder Labs: No results found for: HGBA1C, MPG No results found for: PROLACTIN No results found for: CHOL, TRIG, HDL, CHOLHDL, VLDL, LDLCALC  Physical Findings: AIMS: Facial and Oral Movements Muscles of Facial Expression: None, normal Lips and Perioral Area: None, normal Jaw: None, normal Tongue: None, normal,Extremity Movements Upper (arms, wrists, hands, fingers): None, normal Lower (legs, knees, ankles, toes): None, normal, Trunk Movements Neck, shoulders, hips: None, normal, Overall Severity Severity of abnormal movements (highest score from questions above): None, normal Incapacitation due to abnormal movements: None, normal Patient's awareness of abnormal movements (rate only patient's report): No Awareness, Dental Status Current problems with teeth and/or dentures?: No Does patient usually wear dentures?: No  CIWA:  CIWA-Ar Total: 2 COWS:  COWS Total Score: 2  Musculoskeletal: Strength & Muscle Tone: within normal limits Gait & Station: normal Patient leans: N/A  Psychiatric Specialty Exam: Physical Exam  Nursing note and vitals reviewed. Constitutional: He appears well-developed and well-nourished.  HENT:  Head: Normocephalic and atraumatic.  Eyes: Pupils are equal, round, and reactive to light. Conjunctivae are normal.  Neck: Normal range of motion.  Cardiovascular: Regular rhythm and normal heart sounds.  Respiratory: Effort normal.  GI: Soft.  Musculoskeletal: Normal range of motion.  Neurological: He is alert.  Skin: Skin is warm and dry.  Psychiatric: His speech is normal and behavior is normal. Judgment and thought  content normal. Cognition and memory are normal. He exhibits a depressed mood.    Review of Systems  Constitutional: Negative.   HENT: Negative.   Eyes: Negative.   Respiratory: Negative.   Cardiovascular: Negative.   Gastrointestinal: Negative.   Musculoskeletal: Negative.   Skin: Negative.   Neurological: Negative.   Psychiatric/Behavioral: Positive for depression. Negative for hallucinations, memory loss, substance abuse and suicidal ideas. The patient is nervous/anxious. The patient does not have insomnia.     Blood pressure 126/88, pulse (!) 103, temperature 98.2  F (36.8 C), temperature source Oral, resp. rate 18, height 6\' 1"  (1.854 m), weight 103 kg, SpO2 97 %.Body mass index is 29.95 kg/m.  General Appearance: Fairly Groomed  Eye Contact:  Fair  Speech:  Clear and Coherent  Volume:  Normal  Mood:  Dysphoric  Affect:  Congruent  Thought Process:  Coherent  Orientation:  Full (Time, Place, and Person)  Thought Content:  Logical  Suicidal Thoughts:  No  Homicidal Thoughts:  No  Memory:  Immediate;   Fair Recent;   Fair Remote;   Fair  Judgement:  Fair  Insight:  Fair  Psychomotor Activity:  Normal  Concentration:  Concentration: Fair  Recall:  [employer] of Knowledge:  Fair  Language:  Fair  Akathisia:  No  Handed:  Right  AIMS (if indicated):     Assets:  Desire for Improvement Housing Physical Health Resilience Social Support  ADL's:  Intact  Cognition:  WNL  Sleep:  Number of Hours: 7.5     Treatment Plan Summary: Daily contact with patient to assess and evaluate symptoms and progress in treatment, Medication management and Plan Discussed his overall concerns and diagnosis.  I admitted that it would not be possible to rule out bipolar disorder but that at this point the history is giving me sounds more like major depression.  He tolerated the initial dose of mirtazapine last night without difficulty and I am going to increase it to 30 mg tonight.  He  shows good insight and denies suicidal ideation.  I believe we are likely to discharge him tomorrow morning with outpatient treatment to be arranged.  Allen Rasmussen, MD 02/18/2018, 3:54 PM

## 2018-02-18 NOTE — Progress Notes (Signed)
Recreation Therapy Notes   Date: 02/18/2018  Time: 9:30 am  Location: Craft Room  Behavioral response: Appropriate   Intervention Topic: Happiness  Discussion/Intervention:  Group content today was focused on Happiness. The group defined happiness and described where happiness comes from. Individuals identified what makes them happy and how they go about making others happy. Patients expressed things that stop them from being happy and ways they can improve their happiness. The group stated reasons why it is important to be happy. The group participated in the intervention "My Happiness", where they had a chance to identify and express things that make them happy. Clinical Observations/Feedback:  Patient came to group late due to unknown reason. Individual was social with peers and staff while participating in the intervention. Alzina Golda LRT/CTRS         Tafari Humiston 02/18/2018 11:24 AM

## 2018-02-18 NOTE — [facility] Suicide Risk Assessment (Signed)
[facility] INPATIENT:  Family/Significant Other Suicide Prevention Education  Suicide Prevention Education:  Education Completed; Allen Wilson, mother [phone] has been identified by the patient as the family member/significant other with whom the patient will be residing, and identified as the person(s) who will aid the patient in the event of a mental health crisis (suicidal ideations/suicide attempt).  With written consent from the patient, the family member/significant other has been provided the following suicide prevention education, prior to the and/or following the discharge of the patient.  The suicide prevention education provided includes the following:  Suicide risk factors  Suicide prevention and interventions  National Suicide Hotline telephone number  [facility] assessment telephone number  [facility] Emergency Assistance 911  [facility] and/or Residential Mobile Crisis Unit telephone number  Request made of family/significant other to:  Remove weapons (e.g., guns, rifles, knives), all items previously/currently identified as safety concern.    Remove drugs/medications (over-the-counter, prescriptions, illicit drugs), all items previously/currently identified as a safety concern.  The family member/significant other verbalizes understanding of the suicide prevention education information provided.  The family member/significant other agrees to remove the items of safety concern listed above. Allen Wilson denies pt having access to guns or weapons in the home. She suggested pt be referred for opt. CSW informed Ms. Cox if pt needs further medical attention, call 911 or bring to nearest emergency department.  Allen Wilson 02/18/2018, 3:17 PM

## 2018-02-19 MED ORDER — METOPROLOL TARTRATE 25 MG PO TABS: 25.0000 mg | ORAL_TABLET | Freq: Two times a day (BID) | ORAL | 0 refills | Status: DC | PRN

## 2018-02-19 NOTE — Progress Notes (Signed)
Recreation Therapy Notes   Date: 02/19/2018  Time: 9:30 am  Location: Craft Room  Behavioral response: Appropriate   Intervention Topic: Communication   Discussion/Intervention:  Group content today was focused on communication. The group defined communication and ways to communicate with others. Individuals stated reason why communication is important and some reasons to communicate with others. Patients expressed if they thought they were good at communicating with others and ways they could improve their communication skills. The group identified important parts of communication and some experiences they have had in the past with communication. The group participated in the intervention "What is that?", where they had a chance to test out their communication skills and identify ways to improve their communication techniques.  Clinical Observations/Feedback:  Patient came to group and was focused on what peers and staff had to say about communication.Individual was social with peers and staff while participating in the intervention.  Tell Rozelle LRT/CTRS           Cristyn Crossno 02/19/2018 11:44 AM

## 2018-02-19 NOTE — Discharge Summary (Signed)
Physician Discharge Summary Note  Patient:  Allen Wilson is an 23 y.o., male MRN:  [id number] DOB:  1996-01-06 Patient phone:  [phone] (home)  Patient address:   [address] [place] [id number],  Total Time spent with patient: 45 minutes  Date of Admission:  02/16/2018 Date of Discharge: February 19, 2018  Reason for Admission: Patient admitted in transfer from the medical service where he had been admitted for an overdose of multiple medications.  Patient had taken an overdose of medications with suicidal intent.  He endorsed multiple symptoms of depression with suicidal ideation.  No active substance abuse.  Had been getting medication treatment only recently from his primary care doctor.  Principal Problem: Severe major depression, single episode [facility](HCC) Discharge Diagnoses: Principal Problem:   Severe major depression, single episode (HCC) Active Problems:   Drug overdose   Past Psychiatric History: Patient has a history of depression and he reveals to me some history that is been getting worse of irritability and mood swings.  Some anger problems.  Had recently been prescribed Paxil by his primary care doctor but had not felt like it was of any benefit.  No previous hospitalizations no previous suicide attempts  Past Medical History:  Past Medical History:  Diagnosis Date  . Tachycardia     Past Surgical History:  Procedure Laterality Date  . NO PAST SURGERIES     Family History:  Family History  Problem Relation Age of Onset  . Other Mother        alive & well.  . Hypertension Father   . Heart attack Maternal Grandfather        x 2 - first in his 4440's.   Family Psychiatric  History: None noted Social History:  Social History   Substance and Sexual Activity  Alcohol Use No     Social History   Substance and Sexual Activity  Drug Use No    Social History   Socioeconomic History  . Marital status: Single    Spouse name: Not on file  .  Number of children: Not on file  . Years of education: Not on file  . Highest education level: Not on file  Occupational History  . Occupation: EMT  Social Needs  . Financial resource strain: Not on file  . Food insecurity:    Worry: Not on file    Inability: Not on file  . Transportation needs:    Medical: Not on file    Non-medical: Not on file  Tobacco Use  . Smoking status: Never Smoker  . Smokeless tobacco: Current User    Types: Chew  Substance and Sexual Activity  . Alcohol use: No  . Drug use: No  . Sexual activity: Not on file  Lifestyle  . Physical activity:    Days per week: Not on file    Minutes per session: Not on file  . Stress: Not on file  Relationships  . Social connections:    Talks on phone: Not on file    Gets together: Not on file    Attends religious service: Not on file    Active member of club or organization: Not on file    Attends meetings of clubs or organizations: Not on file    Relationship status: Not on file  Other Topics Concern  . Not on file  Social History Narrative   Works full time as Museum/gallery exhibitions officerMT.  Lives with mother.  Not currently routinely exercising but used to run  cross country and play basketball in [employer].    Hospital Course: Patient admitted to the psychiatric ward.  Patient did not engage in any dangerous or aggressive behavior.  He denied active suicidal ideation.  He attended groups and individual therapy appropriately.  After meeting treatment team and discussion with his physician patient started mirtazapine which has now been increased to 30 mg at night.  At the time of discharge she is denying any suicidal ideation denying any side effects of medication and has no new complaints.  Cardiac status has been free of any complaints and stable during his hospital stay.  Patient was counseled about the importance of following up with outpatient treatment in the community  Physical Findings: AIMS: Facial and Oral Movements Muscles  of Facial Expression: None, normal Lips and Perioral Area: None, normal Jaw: None, normal Tongue: None, normal,Extremity Movements Upper (arms, wrists, hands, fingers): None, normal Lower (legs, knees, ankles, toes): None, normal, Trunk Movements Neck, shoulders, hips: None, normal, Overall Severity Severity of abnormal movements (highest score from questions above): None, normal Incapacitation due to abnormal movements: None, normal Patient's awareness of abnormal movements (rate only patient's report): No Awareness, Dental Status Current problems with teeth and/or dentures?: No Does patient usually wear dentures?: No  CIWA:  CIWA-Ar Total: 2 COWS:  COWS Total Score: 2  Musculoskeletal: Strength & Muscle Tone: within normal limits Gait & Station: normal Patient leans: N/A  Psychiatric Specialty Exam: Physical Exam  Nursing note and vitals reviewed. Constitutional: He appears well-developed and well-nourished.  HENT:  Head: Normocephalic and atraumatic.  Eyes: Pupils are equal, round, and reactive to light. Conjunctivae are normal.  Neck: Normal range of motion.  Cardiovascular: Regular rhythm and normal heart sounds.  Respiratory: Effort normal. No respiratory distress.  GI: Soft.  Musculoskeletal: Normal range of motion.  Neurological: He is alert.  Skin: Skin is warm and dry.  Psychiatric: He has a normal mood and affect. His behavior is normal. Judgment and thought content normal.    Review of Systems  Constitutional: Negative.   HENT: Negative.   Eyes: Negative.   Respiratory: Negative.   Cardiovascular: Negative.   Gastrointestinal: Negative.   Musculoskeletal: Negative.   Skin: Negative.   Neurological: Negative.   Psychiatric/Behavioral: Negative.     Blood pressure 136/71, pulse 66, temperature 98 F (36.7 C), temperature source Oral, resp. rate 16, height 6\' 1"  (1.854 m), weight 103 kg, SpO2 98 %.Body mass index is 29.95 kg/m.  General Appearance: Fairly  Groomed  Eye Contact:  Fair  Speech:  Clear and Coherent  Volume:  Normal  Mood:  Euthymic  Affect:  Congruent  Thought Process:  Goal Directed  Orientation:  Full (Time, Place, and Person)  Thought Content:  Logical  Suicidal Thoughts:  No  Homicidal Thoughts:  No  Memory:  Immediate;   Fair Recent;   Fair Remote;   Fair  Judgement:  Fair  Insight:  Fair  Psychomotor Activity:  Normal  Concentration:  Concentration: Fair  Recall:  [employer]  Fund of Knowledge:  Fair  Language:  Fair  Akathisia:  No  Handed:  Right  AIMS (if indicated):     Assets:  Desire for Improvement Housing Physical Health Resilience Social Support  ADL's:  Intact  Cognition:  WNL  Sleep:  Number of Hours: 6.5     Have you used any form of tobacco in the last 30 days? (Cigarettes, Smokeless Tobacco, Cigars, and/or Pipes): No  Has this patient used any form  of tobacco in the last 30 days? (Cigarettes, Smokeless Tobacco, Cigars, and/or Pipes) Yes, No  Blood Alcohol level:  Lab Results  Component Value Date   ETH <10 02/15/2018   ETH <5 09/17/2016    Metabolic Disorder Labs:  No results found for: HGBA1C, MPG No results found for: PROLACTIN No results found for: CHOL, TRIG, HDL, CHOLHDL, VLDL, LDLCALC  See Psychiatric Specialty Exam and Suicide Risk Assessment completed by Attending Physician prior to discharge.  Discharge destination:  Home  Is patient on multiple antipsychotic therapies at discharge:  No   Has Patient had three or more failed trials of antipsychotic monotherapy by history:  No  Recommended Plan for Multiple Antipsychotic Therapies: NA  Discharge Instructions    Diet - low sodium heart healthy   Complete by:  As directed    Diet - low sodium heart healthy   Complete by:  As directed    Increase activity slowly   Complete by:  As directed    Increase activity slowly   Complete by:  As directed      Allergies as of 02/19/2018   No Known Allergies     Medication  List    TAKE these medications     Indication  metoprolol tartrate 25 MG tablet Commonly known as:  LOPRESSOR Take 1 tablet (25 mg total) by mouth 2 (two) times daily as needed (for heart rate greater than 110).  Indication:  Supraventricular Tachycardia   mirtazapine 30 MG tablet Commonly known as:  REMERON Take 1 tablet (30 mg total) by mouth at bedtime.  Indication:  Major Depressive Disorder      Follow-up Information    [facility]. Go on 02/24/2018.   Why:  Please follow up at [facility] on Wednesday, February 24, 2018 at 11am; arrive early to complete paperwork. You are scheduled to meet with Eliot Ford. Thank you. Contact information: [address]  [place], [place] [address] [phone]          Follow-up recommendations:  Activity:  Activity as tolerated Diet:  Heart healthy diet Other:  Follow-up with outpatient psychiatric treatment in the community  Comments: Scription was given for medicine.  Patient accepting of counseling.  Suicide risk reassessed and at this point patient does not appear to be at actively elevated risk of suicide to warrant further hospitalization.  Signed: [name], MD 02/19/2018, 1:49 PM

## 2018-02-19 NOTE — Progress Notes (Signed)
  [facility] Adult Case Management Discharge Plan :  Will you be returning to the same living situation after discharge:  Yes,  pt lives alone At discharge, do you have transportation home?: Yes,  mother will pick up at discharge Do you have the ability to pay for your medications: Yes,  insurance  Release of information consent forms completed and in the chart;  Patient's signature needed at discharge.  Patient to Follow up at: Follow-up Information    [facility]. Go on 02/24/2018.   Why:  Please follow up at [facility] on Wednesday, February 24, 2018 at 11am; arrive early to complete paperwork. You are scheduled to meet with Eliot Ford. Thank you. Contact information: [address]  [place], [place] [address] [phone]          Next level of care provider has access to [facility] Link:no  Safety Planning and Suicide Prevention discussed: Yes,  Robyn Cox, mother  Have you used any form of tobacco in the last 30 days? (Cigarettes, Smokeless Tobacco, Cigars, and/or Pipes): No  Has patient been referred to the Quitline?: N/A patient is not a smoker  Patient has been referred for addiction treatment: N/A  Suzan Slick, LCSW 02/19/2018, 9:30 AM

## 2018-02-19 NOTE — Progress Notes (Signed)
Recreation Therapy Notes  INPATIENT RECREATION TR PLAN  Patient Details Name: Allen Wilson MRN: [id number] DOB: 13-Dec-1995 Today's Date: 02/19/2018  Rec Therapy Plan Is patient appropriate for Therapeutic Recreation?: Yes Treatment times per week: at least 3 Estimated Length of Stay: 5-7 days TR Treatment/Interventions: Group participation (Comment)  Discharge Criteria Pt will be discharged from therapy if:: Discharged Treatment plan/goals/alternatives discussed and agreed upon by:: Patient/family  Discharge Summary Short term goals set:  Patient will engage in groups without prompting or encouragement from LRT x3 group sessions within 5 recreation therapy group sessions Short term goals met: Adequate for discharge Progress toward goals comments: Groups attended Which groups?: Other (Comment), Communication(Happiness) Reason goals not met: N/A Therapeutic equipment acquired: N/A Reason patient discharged from therapy: Discharge from hospital Pt/family agrees with progress & goals achieved: Yes Date patient discharged from therapy: 02/19/18   [name] 02/19/2018, 3:35 PM

## 2018-02-19 NOTE — Progress Notes (Signed)
D: Pt A & O X 4. Denies SI, HI, AVH and pain at this time. D/C home as ordered. Picked up by his mother. A: D/C instructions reviewed with pt including prescriptions and follow up appointment; compliance encouraged. All belongings from assigned  locker given to pt at time of departure. Scheduled medications given with verbal education and effects monitored. Safety checks maintained without incident till time of d/c.  R: Pt receptive to care. Compliant with medications when offered. Denies adverse drug reactions when assessed. Verbalized understanding related to d/c instructions. Signed belonging sheet in agreement with items received from locker. Ambulatory with a steady gait. Appears to be in no physical distress at time of departure.

## 2018-02-19 NOTE — Plan of Care (Addendum)
No acute events this shift, alert & oriented X 4, very calm but interactive, denies SI, AVA this shift, remain in his room didn't participate in group activities, RN enforced and educated the patient about the importance of participation in group activities, but indicated to be alone at this point even though he verbalized understanding, upheld unit safety measures, denies pain, complied with his medication regimen, had adequate sleep this shift, observed every 15 minutes, will continue to monitor.  Problem: Education: Goal: Ability to make informed decisions regarding treatment will improve Outcome: Progressing   Problem: Coping: Goal: Coping ability will improve Outcome: Progressing   Problem: Medication: Goal: Compliance with prescribed medication regimen will improve Outcome: Progressing   Problem: Self-Concept: Goal: Ability to disclose and discuss suicidal ideas will improve Outcome: Progressing Goal: Will verbalize positive feelings about self Outcome: Progressing   Problem: Education: Goal: Utilization of techniques to improve thought processes will improve Outcome: Progressing Goal: Knowledge of the prescribed therapeutic regimen will improve Outcome: Progressing   Problem: Activity: Goal: Interest or engagement in leisure activities will improve Outcome: Progressing Goal: Imbalance in normal sleep/wake cycle will improve Outcome: Progressing   Problem: Coping: Goal: Coping ability will improve Outcome: Progressing Goal: Will verbalize feelings Outcome: Progressing   Problem: Self-Concept: Goal: Expressions of positive opinion of body image will increase Outcome: Progressing Goal: Will verbalize positive feelings about self Outcome: Progressing   Problem: Education: Goal: Ability to state activities that reduce stress will improve Outcome: Progressing   Problem: Coping: Goal: Ability to identify and develop effective coping behavior will improve Outcome:  Progressing

## 2018-03-03 ENCOUNTER — Other Ambulatory Visit: Payer: Self-pay | Admitting: Psychiatry

## 2018-03-19 ENCOUNTER — Encounter: Payer: Self-pay | Admitting: Emergency Medicine

## 2018-03-19 ENCOUNTER — Emergency Department
Admission: EM | Admit: 2018-03-19 | Discharge: 2018-03-20 | Disposition: A | Discharge status: Home / Self Care | Payer: BLUE CROSS/BLUE SHIELD | Attending: Emergency Medicine | Admitting: Emergency Medicine

## 2018-03-19 DIAGNOSIS — F329 Major depressive disorder, single episode, unspecified: Secondary | ICD-10-CM | POA: Insufficient documentation

## 2018-03-19 DIAGNOSIS — Z79899 Other long term (current) drug therapy: Secondary | ICD-10-CM | POA: Insufficient documentation

## 2018-03-19 DIAGNOSIS — F32A Depression, unspecified: Secondary | ICD-10-CM

## 2018-03-19 NOTE — ED Triage Notes (Signed)
Pt. Here voluntarily for depression and behavior med evaluation.

## 2018-03-19 NOTE — ED Provider Notes (Signed)
[facility] Emergency Department Provider Note   ____________________________________________   First MD Initiated Contact with Patient 03/19/18 2352     (approximate)  I have reviewed the triage vital signs and the nursing notes.   HISTORY  Chief Complaint Behavior Problem    HPI Allen Wilson is a 23 y.o. male who presents to the ED from home voluntarily for depression and behavioral medicine evaluation.  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   He had an intentional overdose at the beginning of the year and was started on Remeron per psychiatry.  Denies active SI/HI/AH/VH.  Voices no medical complaints.   Past Medical History:  Diagnosis Date  . Tachycardia     Patient Active Problem List   Diagnosis Date Noted  . Severe major depression, single episode (HCC) 02/16/2018  . Poisoning by anticholinergic drug, intentional self-harm, initial encounter (HCC) 02/15/2018  . Tachycardia 02/15/2018  . Acute psychosis (HCC)   . Drug overdose 09/17/2016    Past Surgical History:  Procedure Laterality Date  . NO PAST SURGERIES      Prior to Admission medications   Medication Sig Start Date End Date Taking? Authorizing Provider  metoprolol tartrate (LOPRESSOR) 25 MG tablet Take 1 tablet (25 mg total) by mouth 2 (two) times daily as needed (for heart rate greater than 110). 02/19/18   Clapacs, Jackquline DenmarkJohn T, MD  mirtazapine (REMERON) 30 MG tablet Take 1 tablet (30 mg total) by mouth at bedtime. 02/18/18   Clapacs, Jackquline DenmarkJohn T, MD    Allergies Patient has no known allergies.  Family History  Problem Relation Age of Onset  . Other Mother        alive & well.  . Hypertension Father   . Heart attack Maternal Grandfather        x 2 - first in his 2440's.    Social History Social History   Tobacco Use  . Smoking status: Never Smoker  . Smokeless tobacco:  Current User    Types: Chew  Substance Use Topics  . Alcohol use: Not on file  . Drug use: No    Review of Systems  Constitutional: No fever/chills Eyes: No visual changes. ENT: No sore throat. Cardiovascular: Denies chest pain. Respiratory: Denies shortness of breath. Gastrointestinal: No abdominal pain.  No nausea, no vomiting.  No diarrhea.  No constipation. Genitourinary: Negative for dysuria. Musculoskeletal: Negative for back pain. Skin: Negative for rash. Neurological: Negative for headaches, focal weakness or numbness. Psychiatric:  Positive for depression.   ____________________________________________   PHYSICAL EXAM:  VITAL SIGNS: ED Triage Vitals  Enc Vitals Group     BP 03/19/18 2332 (!) 158/93     Pulse Rate 03/19/18 2332 96     Resp 03/19/18 2332 16     Temp 03/19/18 2332 98.1 F (36.7 C)     Temp Source 03/19/18 2332 Oral     SpO2 03/19/18 2332 98 %     Weight 03/19/18 2339 225 lb (102.1 kg)     Height 03/19/18 2339 6\' 1"  (1.854 m)     Head Circumference --      Peak Flow --      Pain Score 03/19/18 2338 0     Pain Loc --      Pain Edu? --      Excl. in GC? --     Constitutional: Alert and oriented. Well  appearing and in no acute distress. Eyes: Conjunctivae are normal. PERRL. EOMI. Head: Atraumatic. Nose: No congestion/rhinnorhea. Mouth/Throat: Mucous membranes are moist.  Oropharynx non-erythematous. Neck: No stridor.   Cardiovascular: Normal rate, regular rhythm. Grossly normal heart sounds.  Good peripheral circulation. Respiratory: Normal respiratory effort.  No retractions. Lungs CTAB. Gastrointestinal: Soft and nontender. No distention. No abdominal bruits. No CVA tenderness. Musculoskeletal: No lower extremity tenderness nor edema.  No joint effusions. Neurologic:  Normal speech and language. No gross focal neurologic deficits are appreciated. No gait instability. Skin:  Skin is warm, dry and intact. No rash noted. Psychiatric: Mood  and affect are normal. Speech and behavior are normal.  ____________________________________________   LABS (all labs ordered are listed, but only abnormal results are displayed)  Labs Reviewed  COMPREHENSIVE METABOLIC PANEL - Abnormal; Notable for the following components:      Result Value   Potassium 3.2 (*)    Glucose, Bld 160 (*)    All other components within normal limits  ACETAMINOPHEN LEVEL - Abnormal; Notable for the following components:   Acetaminophen (Tylenol), Serum <10 (*)    All other components within normal limits  URINE DRUG SCREEN, QUALITATIVE ([facility] ONLY) - Abnormal; Notable for the following components:   Tricyclic, Ur Screen POSITIVE (*)    All other components within normal limits  CBC WITH DIFFERENTIAL/PLATELET  ETHANOL  SALICYLATE LEVEL   ____________________________________________  EKG  None ____________________________________________  RADIOLOGY  ED MD interpretation: None  Official radiology report(s): No results found.  ____________________________________________   PROCEDURES  Procedure(s) performed: None  Procedures  Critical Care performed: No  ____________________________________________   INITIAL IMPRESSION / ASSESSMENT AND PLAN / ED COURSE  As part of my medical decision making, I reviewed the following data within the electronic MEDICAL RECORD NUMBER Nursing notes reviewed and incorporated, Labs reviewed and Notes from prior ED visits    23 year old male who presents voluntarily for behavioral medicine evaluation for depression.  Denies active SI/HI/AH/VH.  Contracts for safety while in the emergency department.  Will keep patient in the ED on voluntary status pending tele-psychiatry evaluation.   Clinical Course as of Mar 21 707  Sat Mar 20, 2018  0708 No further events. Awaiting [facility] psychiatry evaluation.   [JS]    Clinical Course User Index [JS] Irean Hong, MD      ____________________________________________   FINAL CLINICAL IMPRESSION(S) / ED DIAGNOSES  Final diagnoses:  Depression, unspecified depression type     ED Discharge Orders    None       Note:  This document was prepared using Dragon voice recognition software and may include unintentional dictation errors.    Irean Hong, MD 03/20/18 [phone]

## 2018-03-20 LAB — CBC WITH DIFFERENTIAL/PLATELET
Abs Immature Granulocytes: 0.02 10*3/uL (ref 0.00–0.07)
Basophils Absolute: 0.1 10*3/uL (ref 0.0–0.1)
Basophils Relative: 1 %
EOS ABS: 0 10*3/uL (ref 0.0–0.5)
Eosinophils Relative: 0 %
HEMATOCRIT: 45.9 % (ref 39.0–52.0)
Hemoglobin: 15.4 g/dL (ref 13.0–17.0)
Immature Granulocytes: 0 %
Lymphocytes Relative: 34 %
Lymphs Abs: 2.9 10*3/uL (ref 0.7–4.0)
MCH: 29.1 pg (ref 26.0–34.0)
MCHC: 33.6 g/dL (ref 30.0–36.0)
MCV: 86.8 fL (ref 80.0–100.0)
Monocytes Absolute: 0.5 10*3/uL (ref 0.1–1.0)
Monocytes Relative: 6 %
Neutro Abs: 5.2 10*3/uL (ref 1.7–7.7)
Neutrophils Relative %: 59 %
Platelets: 256 10*3/uL (ref 150–400)
RBC: 5.29 MIL/uL (ref 4.22–5.81)
RDW: 12 % (ref 11.5–15.5)
WBC: 8.7 10*3/uL (ref 4.0–10.5)
nRBC: 0 % (ref 0.0–0.2)

## 2018-03-20 LAB — COMPREHENSIVE METABOLIC PANEL
ALT: 36 U/L (ref 0–44)
AST: 30 U/L (ref 15–41)
Albumin: 4.7 g/dL (ref 3.5–5.0)
Alkaline Phosphatase: 69 U/L (ref 38–126)
Anion gap: 8 (ref 5–15)
BUN: 14 mg/dL (ref 6–20)
CO2: 26 mmol/L (ref 22–32)
Calcium: 9.2 mg/dL (ref 8.9–10.3)
Chloride: 103 mmol/L (ref 98–111)
Creatinine, Ser: 1.04 mg/dL (ref 0.61–1.24)
GFR calc Af Amer: >60 mL/min (ref >60)
Glucose, Bld: 160 mg/dL — ABNORMAL HIGH (ref 70–99)
Potassium: 3.2 mmol/L — ABNORMAL LOW (ref 3.5–5.1)
Sodium: 137 mmol/L (ref 135–145)
Total Bilirubin: 0.6 mg/dL (ref 0.3–1.2)
Total Protein: 8 g/dL (ref 6.5–8.1)

## 2018-03-20 LAB — URINE DRUG SCREEN, QUALITATIVE (ARMC ONLY)
Amphetamines, Ur Screen: NOT DETECTED
BARBITURATES, UR SCREEN: NOT DETECTED
Benzodiazepine, Ur Scrn: NOT DETECTED
Cannabinoid 50 Ng, Ur ~~LOC~~: NOT DETECTED
Cocaine Metabolite,Ur ~~LOC~~: NOT DETECTED
MDMA (Ecstasy)Ur Screen: NOT DETECTED
Methadone Scn, Ur: NOT DETECTED
Opiate, Ur Screen: NOT DETECTED
Phencyclidine (PCP) Ur S: NOT DETECTED
Tricyclic, Ur Screen: POSITIVE — AB

## 2018-03-20 LAB — ETHANOL: Alcohol, Ethyl (B): <10 mg/dL (ref <10)

## 2018-03-20 LAB — ACETAMINOPHEN LEVEL

## 2018-03-20 LAB — SALICYLATE LEVEL: Salicylate Lvl: <7 mg/dL (ref 2.8–30.0)

## 2018-03-20 MED ORDER — METOPROLOL TARTRATE 25 MG PO TABS: 25.0000 mg | ORAL_TABLET | Freq: Two times a day (BID) | ORAL | Status: DC | PRN

## 2018-03-20 MED ORDER — MIRTAZAPINE 15 MG PO TABS
30.0000 mg | ORAL_TABLET | Freq: Every day | ORAL | Status: DC
  Administered 2018-03-20: 30 mg via ORAL
  Filled 2018-03-20: qty 2

## 2018-03-20 MED ORDER — POTASSIUM CHLORIDE CRYS ER 20 MEQ PO TBCR
40.0000 meq | EXTENDED_RELEASE_TABLET | Freq: Once | ORAL | Status: AC
  Administered 2018-03-20: 40 meq via ORAL
  Filled 2018-03-20: qty 2

## 2018-03-20 NOTE — ED Notes (Signed)
Report given to [facility] MD. Camera placed in room and pt verbalizes understanding of process.

## 2018-03-20 NOTE — ED Notes (Signed)
Pt discharged to lobby. VS stable. Patient's mother coming to bring patient home.  Discharge paperwork reviewed with patient. Patient signed hard copy of D/C paperwork. All belongings returned to patient.

## 2018-03-20 NOTE — ED Notes (Signed)
Pt woke up for lunch and was  informed by RN his discharged paperwork is ready. Pt waiting to use the phone to call for a ride.  Calm and cooperative. Denies SI/HI. Denies pain. Maintained on 15 minute checks and observation by security camera for safety.

## 2018-03-20 NOTE — ED Notes (Signed)
Pt dressing for discharge. VS stable.  Maintained on 15 minute checks and observation by security camera for safety.

## 2018-03-20 NOTE — ED Notes (Signed)
Consult completed. Camera removed from the room.

## 2018-03-20 NOTE — ED Notes (Signed)
BEHAVIORAL HEALTH ROUNDING Patient sleeping: Yes.   Patient alert and oriented: not applicable SLEEPING Behavior appropriate: Yes.  ; If no, describe: SLEEPING Nutrition and fluids offered: No SLEEPING Toileting and hygiene offered: NoSLEEPING Sitter present: not applicable, Q 15 min safety rounds and observation via security camera. Law enforcement present: Yes ODS 

## 2018-03-20 NOTE — ED Notes (Signed)
Breakfast tray placed on chair in room. Pt sleeping.

## 2018-03-20 NOTE — BH Assessment (Signed)
Assessment Note  Allen Wilson is an 23 y.o. male. Who had reported to the emergency department due to his supervisor suggesting that he come to speak with someone. Patient well known to the service with multiple admissions, most presentation occurring on 02/2018. Please refer to most recent assessment for complete psychosocial history. Pt presents today as oriented X4. Patient has had multiple visit to the emergency department with the history of homelessness. 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 Pt. denies any suicidal ideation, plan or intent. Pt. denies the presence of any auditory or visual hallucinations at this time. Patient denies any other medical complaints. Pt reports compliance with his medication. He states that his supervisor was uncomfortable with him going home alone given his past mental health history.   Diagnosis: Depression   Past Medical History:  Past Medical History:  Diagnosis Date  . Tachycardia     Past Surgical History:  Procedure Laterality Date  . NO PAST SURGERIES      Family History:  Family History  Problem Relation Age of Onset  . Other Mother        alive & well.  . Hypertension Father   . Heart attack Maternal Grandfather        x 2 - first in his 52's.    Social History:  reports that he has never smoked. His smokeless tobacco use includes chew. He reports that he does not use drugs. No history on file for alcohol.  Additional Social History:  Alcohol / Drug Use Pain Medications: SEE MAR Prescriptions: SEE MAR Over the Counter: NONE  History of alcohol / drug use?: No history of alcohol / drug abuse  CIWA: CIWA-Ar BP: (!) 158/93 Pulse Rate: 96 COWS:    Allergies: No Known Allergies  Home Medications: (Not in a hospital admission)   OB/GYN Status:  No LMP for male patient.  General Assessment  Data Location of Assessment: [facility] ED TTS Assessment: In system Is this a Tele or Face-to-Face Assessment?: Face-to-Face Is this an Initial Assessment or a Re-assessment for this encounter?: Initial Assessment Patient Accompanied by:: N/A Language Other than English: No Living Arrangements: Other (Comment) What gender do you identify as?: Male Marital status: Other (comment) Living Arrangements: Spouse/significant other Can pt return to current living arrangement?: Yes Admission Status: Voluntary Is patient capable of signing voluntary admission?: Yes Referral Source: Self/Family/Friend Insurance type: bcbs  Medical Screening Exam [facility] Walk-in ONLY) Medical Exam completed: Yes  Crisis Care Plan Living Arrangements: Spouse/significant other Legal Guardian: (none) Name of Psychiatrist: none  Name of Therapist: none  Education Status Is patient currently in school?: No Is the patient employed, unemployed or receiving disability?: Employed  Risk to self with the past 6 months Suicidal Ideation: No Has patient been a risk to self within the past 6 months prior to admission? : No Suicidal Intent: No Has patient had any suicidal intent within the past 6 months prior to admission? : No Is patient at risk for suicide?: No Suicidal Plan?: No Has patient had any suicidal plan within the past 6 months prior to admission? : No Access to Means: No What has been your use of drugs/alcohol within the last 12 months?: none  Previous Attempts/Gestures: No How many times?: 1 Other Self Harm Risks: none  Triggers for Past Attempts: Family contact, Spouse contact Intentional Self  Injurious Behavior: None Family Suicide History: No Recent stressful life event(s): Loss (Comment), Conflict (Comment) Persecutory voices/beliefs?: No Depression: No Substance abuse history and/or treatment for substance abuse?: No Suicide prevention information given to non-admitted patients: Not  applicable  Risk to Others within the past 6 months Homicidal Ideation: No Does patient have any lifetime risk of violence toward others beyond the six months prior to admission? : No Thoughts of Harm to Others: No Comment - Thoughts of Harm to Others: nn Current Homicidal Intent: No Current Homicidal Plan: No Access to Homicidal Means: No Identified Victim: none  History of harm to others?: No Assessment of Violence: None Noted Violent Behavior Description: none  Does patient have access to weapons?: No Criminal Charges Pending?: No Does patient have a court date: No Is patient on probation?: No  Psychosis Hallucinations: None noted Delusions: None noted  Mental Status Report Appearance/Hygiene: Unremarkable Eye Contact: Fair Motor Activity: Freedom of movement Speech: Logical/coherent Level of Consciousness: Alert Mood: Sad Affect: Appropriate to circumstance Anxiety Level: None Thought Processes: Relevant Judgement: Unimpaired Orientation: Situation, Time, Place, Person Obsessive Compulsive Thoughts/Behaviors: None  Cognitive Functioning Concentration: Good Memory: Remote Intact, Recent Intact Is patient IDD: No Insight: Good Impulse Control: Good Appetite: Good Have you had any weight changes? : No Change Sleep: No Change Total Hours of Sleep: 7 Vegetative Symptoms: None  ADLScreening [facility] Assessment Services) Patient's cognitive ability adequate to safely complete daily activities?: Yes Patient able to express need for assistance with ADLs?: Yes Independently performs ADLs?: Yes (appropriate for developmental age)  Prior Inpatient Therapy Prior Inpatient Therapy: Yes Prior Therapy Dates: 02/17/2018 [facility]  Reason for Treatment: OD  Prior Outpatient Therapy Prior Outpatient Therapy: No Does patient have an ACCT team?: No Does patient have Intensive In-House Services?  : No Does patient have [facility] services? : No Does  patient have P4CC services?: No  ADL Screening (condition at time of admission) Patient's cognitive ability adequate to safely complete daily activities?: Yes Patient able to express need for assistance with ADLs?: Yes Independently performs ADLs?: Yes (appropriate for developmental age)       Abuse/Neglect Assessment (Assessment to be complete while patient is alone) Abuse/Neglect Assessment Can Be Completed: Yes Physical Abuse: Denies Verbal Abuse: Denies Sexual Abuse: Denies Exploitation of patient/patient's resources: Denies Self-Neglect: Denies Possible abuse reported to:: [place] department of social services Values / Beliefs Cultural Requests During Hospitalization: None Spiritual Requests During Hospitalization: None Consults Spiritual Care Consult Needed: No Social Work Consult Needed: No            Disposition:  Disposition Initial Assessment Completed for this Encounter: Yes Patient referred to: Other (Comment)  On Site Evaluation by:   Reviewed with Physician:    Asa SaunasShawanna N Riddik Senna 03/20/2018 2:18 AM

## 2018-03-20 NOTE — ED Notes (Signed)
Pt. States separation from wife this past Christmas.  Pt. States hx of depression and hx of bipolar.  Pt. States admission to lower level behavior unit at the beginning of this month.  Pt. States workplace wanted him to have a behavior med. Evaluation.  Pt. Is calm and cooperative.  Pt. Denies SI/HI or AV/H.

## 2018-03-20 NOTE — ED Notes (Signed)
BEHAVIORAL HEALTH ROUNDING  Patient sleeping: No.  Patient alert and oriented: yes  Behavior appropriate: Yes. ; If no, describe:  Nutrition and fluids offered: Yes  Toileting and hygiene offered: Yes  Sitter present: not applicable, Q 15 min safety rounds and observation via security camera. Law enforcement present: Yes ODS  

## 2018-03-20 NOTE — ED Notes (Signed)
Pt. Changed into burgundy scrubs in room #20.  Pt. Has one pair of white sneakers, pair of black socks, Grey knit hat, grey muscle shirt, purple boxer shorts, long sleeve blue shirt and wallet.

## 2018-03-20 NOTE — ED Notes (Signed)
Patient asleep in room. No noted distress or abnormal behavior. Will continue 15 minute checks and observation by security cameras for safety. 

## 2018-03-20 NOTE — ED Notes (Signed)
Pt brought into ED BHU via sally port and wand with metal detector for safety by ODS officer. Patient oriented to unit/care area: Pt informed of unit policies and procedures.  Informed that, for their safety, care areas are designed for safety and monitored by security cameras at all times; and visiting hours explained to patient. Patient verbalizes understanding, and verbal contract for safety obtained.Pt shown to their room.   ENVIRONMENTAL ASSESSMENT  Potentially harmful objects out of patient reach: Yes.  Personal belongings secured: Yes.  Patient dressed in hospital provided attire only: Yes.  Plastic bags out of patient reach: Yes.  Patient care equipment (cords, cables, call bells, lines, and drains) shortened, removed, or accounted for: Yes.  Equipment and supplies removed from bottom of stretcher: Yes.  Potentially toxic materials out of patient reach: Yes.  Sharps container removed or out of patient reach: Yes.   BEHAVIORAL HEALTH ROUNDING  Patient sleeping: No.  Patient alert and oriented: yes  Behavior appropriate: Yes. ; If no, describe:  Nutrition and fluids offered: Yes  Toileting and hygiene offered: Yes  Sitter present: not applicable, Q 15 min safety rounds and observation via security camera. Law enforcement present: Yes ODS  ED BHU PLACEMENT JUSTIFICATION  Is the patient under IVC or is there intent for IVC: No.  Is the patient medically cleared: Yes.  Is there vacancy in the ED BHU: Yes.  Is the population mix appropriate for patient: Yes.  Is the patient awaiting placement in inpatient or outpatient setting: pending psych eval.   Has the patient had a psychiatric consult: Pending.  Survey of unit performed for contraband, proper placement and condition of furniture, tampering with fixtures in bathroom, shower, and each patient room: Yes. ; Findings: All clear  APPEARANCE/BEHAVIOR  calm, cooperative and adequate rapport can be established  NEURO ASSESSMENT   Orientation: time, place and person  Hallucinations: No.None noted (Hallucinations)  Speech: Normal  Gait: normal  RESPIRATORY ASSESSMENT  WNL  CARDIOVASCULAR ASSESSMENT  WNL  GASTROINTESTINAL ASSESSMENT  WNL  EXTREMITIES  WNL  PLAN OF CARE  Provide calm/safe environment. Vital signs assessed TID. ED BHU Assessment once each 12-hour shift. Collaborate with TTS daily or as condition indicates. Assure the ED provider has rounded once each shift. Provide and encourage hygiene. Provide redirection as needed. Assess for escalating behavior; address immediately and inform ED provider.  Assess family dynamic and appropriateness for visitation as needed: Yes. ; If necessary, describe findings:  Educate the patient/family about BHU procedures/visitation: Yes. ; If necessary, describe findings: Pt is calm and cooperative at this time. Pt understanding and accepting of unit procedures/rules. Will continue to monitor with Q 15 min safety rounds and observation via security camera.

## 2018-03-20 NOTE — ED Provider Notes (Signed)
Patient evaluated by psychiatry not felt to need inpatient management or any indication for IVC.  Patient stable and appropriate for outpatient follow-up.   Allen Wilson, Tauno Falotico, MD 03/20/18 1002

## 2018-05-27 ENCOUNTER — Emergency Department
Admission: EM | Admit: 2018-05-27 | Discharge: 2018-05-27 | Disposition: A | Discharge status: Home / Self Care | Payer: BLUE CROSS/BLUE SHIELD | Attending: Emergency Medicine | Admitting: Emergency Medicine

## 2018-05-27 ENCOUNTER — Other Ambulatory Visit: Payer: Self-pay

## 2018-05-27 DIAGNOSIS — R0602 Shortness of breath: Secondary | ICD-10-CM

## 2018-05-27 DIAGNOSIS — Z79899 Other long term (current) drug therapy: Secondary | ICD-10-CM | POA: Insufficient documentation

## 2018-05-27 DIAGNOSIS — J069 Acute upper respiratory infection, unspecified: Secondary | ICD-10-CM

## 2018-05-27 NOTE — ED Notes (Signed)
Patient reports "my baby mamma got dx last week with COVID 19, reports has been on quarantine since her dx". Symptoms began yesterday, describes symptoms as cough, sob, lost in taste and stuffy nose. Denies fever. Denies diarrhea. Patient awaiting md eval and further plan of care.

## 2018-05-27 NOTE — ED Triage Notes (Signed)
Pt to ED reporting increased SOB today. Pt was exposed to a known COVID positive pt. Pt now having SOB and congestion. No fevers reported at home. No traveling reported.

## 2018-05-27 NOTE — ED Provider Notes (Signed)
[facility] Emergency Department Provider Note  Time seen: 8:57 PM  I have reviewed the triage vital signs and the nursing notes.   HISTORY  Chief Complaint Shortness of Breath    HPI Cortlan Colonel Wilson is a 23 y.o. male no significant past medical history presents to the emergency department for shortness of breath congestion.  According to the patient his girlfriend tested positive for coronavirus on Wednesday of last week.  Patient's girlfriend is a Chief Strategy Officer at [employer].  Patient states he had had contact with the patient prior to her positive test, over the past 24 hours the patient states he has become congested shortness of breath with an occasional dry cough also states he has lost some sense of smell as well.  Patient was concerned that he could have coronavirus we came to the emergency department for evaluation.   Past Medical History:  Diagnosis Date  . Tachycardia     Patient Active Problem List   Diagnosis Date Noted  . Severe major depression, single episode (HCC) 02/16/2018  . Poisoning by anticholinergic drug, intentional self-harm, initial encounter (HCC) 02/15/2018  . Tachycardia 02/15/2018  . Acute psychosis (HCC)   . Drug overdose 09/17/2016    Past Surgical History:  Procedure Laterality Date  . NO PAST SURGERIES      Prior to Admission medications   Medication Sig Start Date End Date Taking? Authorizing Provider  metoprolol tartrate (LOPRESSOR) 25 MG tablet Take 1 tablet (25 mg total) by mouth 2 (two) times daily as needed (for heart rate greater than 110). 02/19/18   Clapacs, Jackquline Denmark, MD  mirtazapine (REMERON) 30 MG tablet Take 1 tablet (30 mg total) by mouth at bedtime. 02/18/18   Clapacs, Jackquline Denmark, MD    No Known Allergies  Family History  Problem Relation Age of Onset  . Other Mother        alive & well.  . Hypertension Father   . Heart attack Maternal Grandfather        x 2 - first in his 42's.    Social  History Social History   Tobacco Use  . Smoking status: Never Smoker  . Smokeless tobacco: Current User    Types: Chew  Substance Use Topics  . Alcohol use: Not on file  . Drug use: No    Review of Systems Constitutional: Negative for fever. ENT: Positive for congestion Cardiovascular: Negative for chest pain.  Respiratory: Mild shortness of breath today. Gastrointestinal: Negative for abdominal pain, vomiting  Musculoskeletal: Negative for musculoskeletal complaints Skin: Negative for skin complaints  Neurological: Negative for headache All other ROS negative  ____________________________________________   PHYSICAL EXAM:  VITAL SIGNS: ED Triage Vitals  Enc Vitals Group     BP 05/27/18 2011 133/84     Pulse Rate 05/27/18 2011 (!) 110     Resp 05/27/18 2011 20     Temp 05/27/18 2011 97.8 F (36.6 C)     Temp Source 05/27/18 2011 Oral     SpO2 05/27/18 2011 100 %     Weight 05/27/18 2013 225 lb (102.1 kg)     Height 05/27/18 2013 6\' 1"  (1.854 m)     Head Circumference --      Peak Flow --      Pain Score 05/27/18 2013 0     Pain Loc --      Pain Edu? --      Excl. in GC? --     Constitutional:  Alert and oriented. Well appearing and in no distress. Eyes: Normal exam ENT   Head: Normocephalic and atraumatic.   Mouth/Throat: Mucous membranes are moist. Cardiovascular: Normal rate, regular rhythm around 100 bpm. Respiratory: Normal respiratory effort without tachypnea nor retractions. Breath sounds are clear, no wheeze rales or rhonchi. Gastrointestinal: Soft and nontender. No distention. Musculoskeletal: Nontender with normal range of motion in all extremities.  Neurologic:  Normal speech and language. No gross focal neurologic deficits  Skin:  Skin is warm, dry and intact.  Psychiatric: Mood and affect are normal.  ____________________________________________   INITIAL IMPRESSION / ASSESSMENT AND PLAN / ED COURSE  Pertinent labs & imaging results  that were available during my care of the patient were reviewed by me and considered in my medical decision making (see chart for details).  Patient presents emergency department for congestion, shortness of breath dry cough, loss of smell starting over the past 24 hours.  Known corona contact last week.  Overall the patient appears well, reassuring vitals, 100% room air saturation.  Clear lung sounds.  We will test for corona given known positive exposure and new symptom onset.  We will discharge, discussed self-isolation/quarantine with the patient, Tylenol every 6 hours as needed for fever or discomfort.  I also discussed return precautions with the patient.  ____________________________________________   FINAL CLINICAL IMPRESSION(S) / ED DIAGNOSES  Upper respiratory infection   Minna AntisPaduchowski, Amyre Segundo, MD 05/27/18 2100

## 2018-05-27 NOTE — ED Notes (Signed)
Patient evaluated by Md. Swabbed fro COVID 19. Discharge instructions given with self monitor tool. Patient verbalized understanding.

## 2018-05-28 ENCOUNTER — Other Ambulatory Visit: Payer: Self-pay

## 2018-05-28 ENCOUNTER — Emergency Department
Admission: EM | Admit: 2018-05-28 | Discharge: 2018-05-29 | Disposition: A | Discharge status: Home / Self Care | Payer: BLUE CROSS/BLUE SHIELD | Attending: Emergency Medicine | Admitting: Emergency Medicine

## 2018-05-28 ENCOUNTER — Encounter: Payer: Self-pay | Admitting: Emergency Medicine

## 2018-05-28 DIAGNOSIS — F17220 Nicotine dependence, chewing tobacco, uncomplicated: Secondary | ICD-10-CM | POA: Diagnosis not present

## 2018-05-28 DIAGNOSIS — Z79899 Other long term (current) drug therapy: Secondary | ICD-10-CM | POA: Diagnosis not present

## 2018-05-28 DIAGNOSIS — T43591A Poisoning by other antipsychotics and neuroleptics, accidental (unintentional), initial encounter: Secondary | ICD-10-CM | POA: Diagnosis not present

## 2018-05-28 DIAGNOSIS — T50901A Poisoning by unspecified drugs, medicaments and biological substances, accidental (unintentional), initial encounter: Secondary | ICD-10-CM

## 2018-05-28 LAB — COMPREHENSIVE METABOLIC PANEL
ALT: 32 U/L (ref 0–44)
AST: 24 U/L (ref 15–41)
Albumin: 4.7 g/dL (ref 3.5–5.0)
Alkaline Phosphatase: 73 U/L (ref 38–126)
Anion gap: 10 (ref 5–15)
BUN: 13 mg/dL (ref 6–20)
CO2: 25 mmol/L (ref 22–32)
Calcium: 9.1 mg/dL (ref 8.9–10.3)
Chloride: 103 mmol/L (ref 98–111)
Creatinine, Ser: 1.22 mg/dL (ref 0.61–1.24)
GFR calc Af Amer: >60 mL/min (ref >60)
GFR calc non Af Amer: >60 mL/min (ref >60)
Glucose, Bld: 103 mg/dL — ABNORMAL HIGH (ref 70–99)
Potassium: 3.4 mmol/L — ABNORMAL LOW (ref 3.5–5.1)
Sodium: 138 mmol/L (ref 135–145)
Total Bilirubin: 0.7 mg/dL (ref 0.3–1.2)
Total Protein: 8 g/dL (ref 6.5–8.1)

## 2018-05-28 LAB — CBC
HCT: 49.1 % (ref 39.0–52.0)
Hemoglobin: 16.8 g/dL (ref 13.0–17.0)
MCH: 29.3 pg (ref 26.0–34.0)
MCHC: 34.2 g/dL (ref 30.0–36.0)
MCV: 85.5 fL (ref 80.0–100.0)
Platelets: 203 10*3/uL (ref 150–400)
RBC: 5.74 MIL/uL (ref 4.22–5.81)
RDW: 11.6 % (ref 11.5–15.5)
WBC: 6.1 10*3/uL (ref 4.0–10.5)
nRBC: 0 % (ref 0.0–0.2)

## 2018-05-28 LAB — ACETAMINOPHEN LEVEL: Acetaminophen (Tylenol), Serum: 11 ug/mL (ref 10–30)

## 2018-05-28 LAB — LIPASE, BLOOD: Lipase: 27 U/L (ref 11–51)

## 2018-05-28 LAB — MAGNESIUM: Magnesium: 2.1 mg/dL (ref 1.7–2.4)

## 2018-05-28 LAB — ETHANOL: Alcohol, Ethyl (B): <10 mg/dL (ref <10)

## 2018-05-28 LAB — TROPONIN I: Troponin I: <0.03 ng/mL (ref <0.03)

## 2018-05-28 LAB — SALICYLATE LEVEL: Salicylate Lvl: <7 mg/dL (ref 2.8–30.0)

## 2018-05-28 MED ORDER — DIPHENHYDRAMINE HCL 50 MG/ML IJ SOLN
50.0000 mg | Freq: Once | INTRAMUSCULAR | Status: AC
  Administered 2018-05-28: 50 mg via INTRAVENOUS
  Filled 2018-05-28: qty 1

## 2018-05-28 MED ORDER — POTASSIUM CHLORIDE CRYS ER 20 MEQ PO TBCR
40.0000 meq | EXTENDED_RELEASE_TABLET | Freq: Once | ORAL | Status: AC
  Administered 2018-05-28: 40 meq via ORAL
  Filled 2018-05-28: qty 2

## 2018-05-28 MED ORDER — DIPHENHYDRAMINE HCL 50 MG/ML IJ SOLN: 25.0000 mg | Freq: Once | INTRAMUSCULAR | Status: DC

## 2018-05-28 MED ORDER — SODIUM CHLORIDE 0.9 % IV BOLUS
1000.0000 mL | Freq: Once | INTRAVENOUS | Status: AC
  Administered 2018-05-28: 1000 mL via INTRAVENOUS

## 2018-05-28 NOTE — BH Assessment (Signed)
TTS is available for tele-assessment at anytime. Number to machine is [phone] 

## 2018-05-28 NOTE — ED Provider Notes (Signed)
[facility] Emergency Department Provider Note   ____________________________________________   First MD Initiated Contact with Patient 05/28/18 2024     (approximate)  I have reviewed the triage vital signs and the nursing notes.   HISTORY  Chief Complaint Drug Overdose    HPI Allen Wilson is a 23 y.o. male here for evaluation after a overdose with risperidone  Patient reports that last night he was "bored" so he began drinking alcohol.  When he woke up this morning he realized the last 5 tablets of his Risperdal were gone.  Reports he is not been feeling right, has been feeling a little lightheaded, at times it feels like he is having trouble with eye movements.  Reports that this was not an intentional attempt to hurt himself.  He does report a history of overdoses in the past, but reports last night he was simply drinking too much alcohol and remembers taking the risperidone to help him sleep  Denies suicidal ideation.  Denies homicidal ideation.  Presents voluntarily.  No headache.  No nausea vomiting.  Feels little sweaty, feels like his eye movements are correct.   Past Medical History:  Diagnosis Date  . Tachycardia     Patient Active Problem List   Diagnosis Date Noted  . Severe major depression, single episode (HCC) 02/16/2018  . Poisoning by anticholinergic drug, intentional self-harm, initial encounter (HCC) 02/15/2018  . Tachycardia 02/15/2018  . Acute psychosis (HCC)   . Drug overdose 09/17/2016    Past Surgical History:  Procedure Laterality Date  . NO PAST SURGERIES      Prior to Admission medications   Medication Sig Start Date End Date Taking? Authorizing Provider  ALPRAZolam Prudy Feeler) 0.5 MG tablet Take 1 tablet by mouth daily. 05/27/18   [provider]  metoprolol tartrate (LOPRESSOR) 25 MG tablet Take 1 tablet (25 mg total) by mouth 2 (two) times daily as needed (for heart rate greater than 110). 02/19/18    Clapacs, Jackquline Denmark, MD  mirtazapine (REMERON) 30 MG tablet Take 1 tablet (30 mg total) by mouth at bedtime. Patient not taking: Reported on 05/28/2018 02/18/18   Clapacs, Jackquline Denmark, MD  risperiDONE (RISPERDAL) 1 MG tablet Take 1 mg by mouth daily. 05/27/18   [provider]    Allergies Patient has no known allergies.  Family History  Problem Relation Age of Onset  . Other Mother        alive & well.  . Hypertension Father   . Heart attack Maternal Grandfather        x 2 - first in his 75's.    Social History Social History   Tobacco Use  . Smoking status: Never Smoker  . Smokeless tobacco: Current User    Types: Chew  Substance Use Topics  . Alcohol use: Yes    Alcohol/week: 3.0 standard drinks    Types: 3 Cans of beer per week  . Drug use: No    Review of Systems Constitutional: No fever/chills Eyes: Reports it feels like his eyes keep on a look up ENT: No sore throat. Cardiovascular: Denies chest pain. Respiratory: Denies shortness of breath. Gastrointestinal: No abdominal pain.   Genitourinary: Negative for dysuria. Musculoskeletal: Negative for back pain. Skin: Negative for rash.  Feels little sweaty at times. Neurological: Negative for headaches, areas of focal weakness or numbness.  Feels generally fatigued.    ____________________________________________   PHYSICAL EXAM:  VITAL SIGNS: ED Triage Vitals  Enc Vitals Group  BP 05/28/18 2027 (!) 154/92     Pulse Rate 05/28/18 2027 (!) 105     Resp 05/28/18 2027 20     Temp 05/28/18 2027 97.8 F (36.6 C)     Temp Source 05/28/18 2027 Oral     SpO2 05/28/18 2027 99 %     Weight 05/28/18 2024 225 lb (102.1 kg)     Height 05/28/18 2024 6\' 1"  (1.854 m)     Head Circumference --      Peak Flow --      Pain Score 05/28/18 2024 4     Pain Loc --      Pain Edu? --      Excl. in GC? --     Constitutional: Alert and oriented. Well appearing and in no acute distress. Eyes: Conjunctivae are normal.   Patient has vertical gaze, appears to have oculogyric crisis.  Continuously seems to drift his eyes to look in the upper left field, but is able to follow my hand on exam but once he stops focusing his eyes go back to the left upper field Head: Atraumatic. Nose: No congestion/rhinnorhea. Mouth/Throat: Mucous membranes are slightly dry. Neck: No stridor.  Cardiovascular: Only tachycardic rate, regular rhythm. Grossly normal heart sounds.  Good peripheral circulation. Respiratory: Normal respiratory effort.  No retractions. Lungs CTAB. Gastrointestinal: Soft and nontender. No distention. Musculoskeletal: No lower extremity tenderness nor edema.  He is able to range his extremities normally.  There is no clonus.  Normal reflexes. Neurologic:  Normal speech and language. No gross focal neurologic deficits are appreciated.  Skin:  Skin is warm, dry and intact. No rash noted. Psychiatric: Mood and affect are normal. Speech and behavior are normal.  Denies hallucinations.  Denies an intentional attempt at self-harm.  Reports he was "just bored" and was drinking heavily last night that led him to take his medications when he was trying to find sleep.  Denies use of any medication other than Risperdal last night.  ____________________________________________   LABS (all labs ordered are listed, but only abnormal results are displayed)  Labs Reviewed  COMPREHENSIVE METABOLIC PANEL - Abnormal; Notable for the following components:      Result Value   Potassium 3.4 (*)    Glucose, Bld 103 (*)    All other components within normal limits  SALICYLATE LEVEL  ACETAMINOPHEN LEVEL  CBC  ETHANOL  LIPASE, BLOOD  MAGNESIUM  TROPONIN I   ____________________________________________  EKG  Reviewed entered by me at 2100 Heart rate 80 QRS 90 QTc 400 Sinus rhythm with occasional junctional beats.  Possible left ventricular hypertrophy.  No evidence of acute ischemia  ____________________________________________  RADIOLOGY  No results found.   ____________________________________________   PROCEDURES  Procedure(s) performed: None  Procedures  Critical Care performed: No  ____________________________________________   INITIAL IMPRESSION / ASSESSMENT AND PLAN / ED COURSE  Pertinent labs & imaging results that were available during my care of the patient were reviewed by me and considered in my medical decision making (see chart for details).   Patient presents with clinical examination consistent with ocular  crisis.  In the setting of recent alcohol use And Risperdal overdose.  Patient is very clear that this was nonintentional with regard to any attempt to harm himself.  Reports he was drinking alcohol heavily and was medicating to try to help himself fall asleep after.  He was using recreationally on the alcohol.  Denies any hallucinations or self harming ideation  He is alert, not  somnolent, reassuring clinical exam except for abnormal ocular movements.  He is also recently being ruled out for coronavirus or evaluated for coronavirus, he reports those symptoms have improved his oxygenation is normal his work of breathing is normal.  He does not appear toxic.  Allen Wilson was evaluated in Emergency Department on 05/28/2018 for the symptoms described in the history of present illness. He was evaluated in the context of the global COVID-19 pandemic, which necessitated consideration that the patient might be at risk for infection with the SARS-CoV-2 virus that causes COVID-19. Institutional protocols and algorithms that pertain to the evaluation of patients at risk for COVID-19 are in a state of rapid change based on information released by regulatory bodies including the CDC and federal and state organizations. These policies and algorithms were followed during the patient's care in the ED.   Clinical Course as of May 28 2231  Fri  May 28, 2018  2032 Patient denies any fever cough or shortness of breath.  Reports he did not feel well yesterday and had a bit of a cough and runny nose and his girlfriend has coronavirus.  However he reports that is feeling better.   [MQ]  2113 Discussed with [facility] Merdis Delay, RN).    [MQ]  2121 Dr. Livia Snellen (toxicology)   [MQ]  2125 Discussed with toxicologist, advises give benadryl  IV for ocular symptoms, dystonic symptoms.    [MQ]  2125 Tox advises consider unlikely cardiac effect from overdose. ? Holidy heart from heavy alcohol    [MQ]  2126 Tox advises obs overnight and likely be fine to discharge home in the AM.    [MQ]  2133 APAP level = 11. Called poison control. Patient denies tylenol OD.    [MQ]  2135 Bernice at poison control advises no further follow-up needed on low apap level.  Acetaminophen (Tylenol), S: 11 [MQ]    Clinical Course User Index [MQ] Sharyn Creamer, MD   ----------------------------------------- 10:33 PM on 05/28/2018 -----------------------------------------  Patient is alert and oriented.  Sitting up.  Reports he feels much better.  His extraocular movements are now normal.  He says he is feeling great.  We will continue to observe him, toxicology advised probably needs about a 4 to 6-hour observation, but since it is late they recommended just observing him till the morning light.  Patient is comfortable this plan.  Tele-Psychiatry setting up to see the patient now.  Ongoing care assigned to Dr. Derrill Kay at 11:08 PM.  Plan to follow-up on tele-psychiatry recommendations, also continue to monitor the patient for evidence of somnolence or untoward side effect from Risperdal ingestion until the morning.  I anticipate patient will likely be able to be discharged in the morning after observation pending recommendations from tele-psychiatry as well.  Patient is in agreement with this plan.  He appears much improved  ____________________________________________    FINAL CLINICAL IMPRESSION(S) / ED DIAGNOSES  Final diagnoses:  Accidental drug overdose, initial encounter        Note:  This document was prepared using Dragon voice recognition software and may include unintentional dictation errors       Sharyn Creamer, MD 05/28/18 2309

## 2018-05-28 NOTE — ED Notes (Signed)
SOC MONITOR SET UP IN ROOM

## 2018-05-28 NOTE — ED Triage Notes (Signed)
Pt arrives POV with c/o "accidentally taking" his Risperdal 5-10 pills while being intoxicated last night. Pt denies SI or HI at this time. Pt states that his girlfriend is currently quarantined for positive Covid.m

## 2018-05-28 NOTE — Discharge Instructions (Addendum)
Please take Benadryl every 6 hours for the next 2 days.   Only use medications as PRESCRIBED.  You have been seen in the Emergency Department (ED) today for a psychiatric complaint.  You have been evaluated by psychiatry and we believe you are safe to be discharged from the hospital.    Please return to the ED immediately if you have ANY thoughts of hurting yourself or anyone else, so that we may help you.  Please avoid alcohol and drug use.  Follow up with your doctor and/or therapist as soon as possible regarding today's ED visit.   Please follow up any other recommendations and clinic appointments provided by the psychiatry team that saw you in the Emergency Department.

## 2018-05-29 MED ORDER — DIPHENHYDRAMINE HCL 50 MG/ML IJ SOLN
50.0000 mg | Freq: Once | INTRAMUSCULAR | Status: AC
  Administered 2018-05-29: 50 mg via INTRAVENOUS
  Filled 2018-05-29: qty 1

## 2018-05-31 ENCOUNTER — Telehealth (HOSPITAL_BASED_OUTPATIENT_CLINIC_OR_DEPARTMENT_OTHER): Payer: Self-pay | Admitting: Emergency Medicine

## 2018-05-31 ENCOUNTER — Telehealth: Payer: Self-pay | Admitting: Emergency Medicine

## 2018-05-31 LAB — NOVEL CORONAVIRUS, NAA (HOSP ORDER, SEND-OUT TO REF LAB; TAT 18-24 HRS): SARS-CoV-2, NAA: DETECTED — AB

## 2018-05-31 NOTE — Telephone Encounter (Signed)
Called patient to give positive covid 19 result.  Explained cdc guidelines for self isolation.

## 2018-12-08 ENCOUNTER — Ambulatory Visit: Payer: Self-pay

## 2018-12-08 DIAGNOSIS — Z021 Encounter for pre-employment examination: Secondary | ICD-10-CM

## 2018-12-10 LAB — POCT URINALYSIS DIPSTICK
Bilirubin, UA: NEGATIVE
Blood, UA: NEGATIVE
Glucose, UA: NEGATIVE
Ketones, UA: NEGATIVE
Leukocytes, UA: NEGATIVE
Nitrite, UA: NEGATIVE
Protein, UA: NEGATIVE
Spec Grav, UA: >=1.03 — AB (ref 1.010–1.025)
Urobilinogen, UA: 0.2 E.U./dL
pH, UA: 6 (ref 5.0–8.0)

## 2018-12-31 ENCOUNTER — Other Ambulatory Visit: Payer: Self-pay | Admitting: *Deleted

## 2018-12-31 DIAGNOSIS — S43431A Superior glenoid labrum lesion of right shoulder, initial encounter: Secondary | ICD-10-CM | POA: Diagnosis not present

## 2018-12-31 DIAGNOSIS — Z20822 Contact with and (suspected) exposure to covid-19: Secondary | ICD-10-CM

## 2019-01-03 LAB — NOVEL CORONAVIRUS, NAA: SARS-CoV-2, NAA: NOT DETECTED

## 2019-01-31 ENCOUNTER — Other Ambulatory Visit: Payer: Self-pay

## 2019-01-31 ENCOUNTER — Ambulatory Visit: Payer: 59 | Admitting: Occupational Medicine

## 2019-01-31 ENCOUNTER — Encounter: Payer: Self-pay | Admitting: Occupational Medicine

## 2019-01-31 VITALS — HR 88 | Temp 98.7°F | Resp 12 | Ht 76.0 in | Wt 251.0 lb

## 2019-01-31 DIAGNOSIS — Z0000 Encounter for general adult medical examination without abnormal findings: Secondary | ICD-10-CM

## 2019-01-31 DIAGNOSIS — Z021 Encounter for pre-employment examination: Secondary | ICD-10-CM

## 2019-01-31 NOTE — Progress Notes (Signed)
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  Darlin Priestly, MHS, PA-C 02/07/2019  This note is not being shared with the patient for the following reason: [employer] / work related physical

## 2019-02-04 NOTE — Addendum Note (Signed)
Addended by: Aliene Altes on: 02/04/2019 10:20 AM   Modules accepted: Orders

## 2019-05-23 ENCOUNTER — Ambulatory Visit: Payer: Self-pay

## 2019-05-23 ENCOUNTER — Other Ambulatory Visit: Payer: Self-pay

## 2019-05-23 DIAGNOSIS — Z0000 Encounter for general adult medical examination without abnormal findings: Secondary | ICD-10-CM

## 2019-05-23 DIAGNOSIS — H6692 Otitis media, unspecified, left ear: Secondary | ICD-10-CM | POA: Diagnosis not present

## 2019-05-23 DIAGNOSIS — Z20822 Contact with and (suspected) exposure to covid-19: Secondary | ICD-10-CM | POA: Diagnosis not present

## 2019-05-23 DIAGNOSIS — R070 Pain in throat: Secondary | ICD-10-CM | POA: Diagnosis not present

## 2019-05-23 DIAGNOSIS — J039 Acute tonsillitis, unspecified: Secondary | ICD-10-CM | POA: Diagnosis not present

## 2019-05-23 LAB — POCT URINALYSIS DIPSTICK
Bilirubin, UA: NEGATIVE
Blood, UA: NEGATIVE
Glucose, UA: NEGATIVE
Ketones, UA: NEGATIVE
Leukocytes, UA: NEGATIVE
Nitrite, UA: NEGATIVE
Protein, UA: POSITIVE — AB
Spec Grav, UA: 1.02 (ref 1.010–1.025)
Urobilinogen, UA: 0.2 E.U./dL
pH, UA: 7 (ref 5.0–8.0)

## 2019-05-23 NOTE — Progress Notes (Signed)
Scheduled to complete physical on 06/21/19.  AMD

## 2019-05-26 LAB — CMP12+LP+TP+TSH+6AC+CBC/D/PLT
ALT: 36 IU/L (ref 0–44)
AST: 24 IU/L (ref 0–40)
Albumin/Globulin Ratio: 1.8 (ref 1.2–2.2)
Albumin: 5 g/dL (ref 4.1–5.2)
Alkaline Phosphatase: 78 IU/L (ref 39–117)
BUN/Creatinine Ratio: 13 (ref 9–20)
BUN: 15 mg/dL (ref 6–20)
Basophils Absolute: 0.1 10*3/uL (ref 0.0–0.2)
Basos: 1 %
Bilirubin Total: 0.4 mg/dL (ref 0.0–1.2)
Calcium: 9.8 mg/dL (ref 8.7–10.2)
Chloride: 101 mmol/L (ref 96–106)
Chol/HDL Ratio: 4.9 ratio (ref 0.0–5.0)
Cholesterol, Total: 190 mg/dL (ref 100–199)
Creatinine, Ser: 1.2 mg/dL (ref 0.76–1.27)
EOS (ABSOLUTE): 0 10*3/uL (ref 0.0–0.4)
Eos: 0 %
Estimated CHD Risk: 1 times avg. (ref 0.0–1.0)
Free Thyroxine Index: 1.9 (ref 1.2–4.9)
GFR calc Af Amer: 99 mL/min/{1.73_m2} (ref >59)
GFR calc non Af Amer: 85 mL/min/{1.73_m2} (ref >59)
GGT: 31 IU/L (ref 0–65)
Globulin, Total: 2.8 g/dL (ref 1.5–4.5)
Glucose: 98 mg/dL (ref 65–99)
HDL: 39 mg/dL — ABNORMAL LOW (ref >39)
Hematocrit: 50.1 % (ref 37.5–51.0)
Hemoglobin: 17.2 g/dL (ref 13.0–17.7)
Immature Grans (Abs): 0.1 10*3/uL (ref 0.0–0.1)
Immature Granulocytes: 1 %
Iron: 29 ug/dL — ABNORMAL LOW (ref 38–169)
LDH: 187 IU/L (ref 121–224)
LDL Chol Calc (NIH): 119 mg/dL — ABNORMAL HIGH (ref 0–99)
Lymphocytes Absolute: 1.5 10*3/uL (ref 0.7–3.1)
Lymphs: 10 %
MCH: 30 pg (ref 26.6–33.0)
MCHC: 34.3 g/dL (ref 31.5–35.7)
MCV: 87 fL (ref 79–97)
Monocytes Absolute: 1.1 10*3/uL — ABNORMAL HIGH (ref 0.1–0.9)
Monocytes: 7 %
Neutrophils Absolute: 12.4 10*3/uL — ABNORMAL HIGH (ref 1.4–7.0)
Neutrophils: 81 %
Phosphorus: 2.7 mg/dL — ABNORMAL LOW (ref 2.8–4.1)
Platelets: 243 10*3/uL (ref 150–450)
Potassium: 3.9 mmol/L (ref 3.5–5.2)
RBC: 5.74 x10E6/uL (ref 4.14–5.80)
RDW: 12 % (ref 11.6–15.4)
Sodium: 137 mmol/L (ref 134–144)
T3 Uptake Ratio: 28 % (ref 24–39)
T4, Total: 6.8 ug/dL (ref 4.5–12.0)
TSH: 0.817 u[IU]/mL (ref 0.450–4.500)
Total Protein: 7.8 g/dL (ref 6.0–8.5)
Triglycerides: 181 mg/dL — ABNORMAL HIGH (ref 0–149)
Uric Acid: 9.2 mg/dL — ABNORMAL HIGH (ref 3.8–8.4)
VLDL Cholesterol Cal: 32 mg/dL (ref 5–40)
WBC: 15.1 10*3/uL — ABNORMAL HIGH (ref 3.4–10.8)

## 2019-05-26 LAB — QUANTIFERON-TB GOLD PLUS
QuantiFERON Mitogen Value: >10 IU/mL
QuantiFERON Nil Value: 0.24 IU/mL
QuantiFERON TB1 Ag Value: 0.27 IU/mL
QuantiFERON TB2 Ag Value: 0.25 IU/mL
QuantiFERON-TB Gold Plus: NEGATIVE

## 2019-06-28 ENCOUNTER — Ambulatory Visit: Payer: 59 | Admitting: Physician Assistant

## 2019-06-28 ENCOUNTER — Other Ambulatory Visit: Payer: Self-pay

## 2019-06-28 ENCOUNTER — Encounter: Payer: Self-pay | Admitting: Physician Assistant

## 2019-06-28 VITALS — BP 134/84 | HR 77 | Temp 98.7°F | Resp 16 | Ht 76.0 in | Wt 253.0 lb

## 2019-06-28 DIAGNOSIS — Z0000 Encounter for general adult medical examination without abnormal findings: Secondary | ICD-10-CM

## 2019-06-28 NOTE — Progress Notes (Signed)
EKG completed today.  AMD 

## 2019-06-28 NOTE — Progress Notes (Signed)
   Subjective: Firefighter physical examination    Patient ID: Allen Wilson, male    DOB: 1995/07/18, 24 y.o.   MRN: [id number]  HPI Patient presents for annual firefighter exam.  Voices no concerns or complaints.   Review of Systems    Bipolar and depression Objective:   Physical Exam No acute distress.  HEENT unremarkable.  Neck is supple without adenopathy or bruits.  Lungs clear to auscultation.  Heart regular rate and rhythm.  Abdomen negative HSM, normoactive bowel sounds, soft, and nontender to palpation.  No obvious deformity to the upper or lower extremities.  Patient has full equal range of motion upper lower extremities.  No obvious deformity to cervical or lumbar spine.  Patient has full equal range of motion cervical lumbar spine.  Cranial nerves II through XII grossly intact.         Assessment & Plan: Well exam. Discussed lab results with patient.  Past follow-up as necessary.

## 2020-02-07 ENCOUNTER — Ambulatory Visit: Payer: Self-pay | Admitting: Nurse Practitioner

## 2020-02-07 ENCOUNTER — Other Ambulatory Visit: Payer: Self-pay

## 2020-02-07 ENCOUNTER — Encounter: Payer: Self-pay | Admitting: Nurse Practitioner

## 2020-02-07 VITALS — BP 140/91 | HR 89 | Temp 99.4°F | Resp 12 | Ht 76.0 in | Wt 251.0 lb

## 2020-02-07 DIAGNOSIS — L818 Other specified disorders of pigmentation: Secondary | ICD-10-CM

## 2020-02-07 DIAGNOSIS — Z23 Encounter for immunization: Secondary | ICD-10-CM

## 2020-02-07 DIAGNOSIS — R590 Localized enlarged lymph nodes: Secondary | ICD-10-CM

## 2020-02-07 DIAGNOSIS — T148XXA Other injury of unspecified body region, initial encounter: Secondary | ICD-10-CM

## 2020-02-07 MED ORDER — CEPHALEXIN 500 MG PO CAPS: 500.0000 mg | ORAL_CAPSULE | Freq: Four times a day (QID) | ORAL | 0 refills | Status: DC

## 2020-02-07 MED ORDER — PREDNISONE 20 MG PO TABS: 20.0000 mg | ORAL_TABLET | Freq: Two times a day (BID) | ORAL | 0 refills | Status: DC

## 2020-02-07 NOTE — Progress Notes (Signed)
Tattoo was done 8 days ago - last Monday.  Redness has been at site since Monday, but the swelling didn't start till yesterday. Area is painful.  Iced yesterday when swelling started. Took ibuprofen & benadryl yesterday.  Last Tdap documented in paper chart in clinic is 2009. Tdap needs to be updated.  AMD

## 2020-02-07 NOTE — Progress Notes (Signed)
Subjective:    Patient ID: Allen Wilson, male    DOB: Dec 05, 1995, 24 y.o.   MRN: [id number]  HPI Allen Wilson is a 24 y.o. male employed at the [employer] who presents to the [facility] today with c/o possible wound infection. He reports getting a large tattoo on his left wrist and forearm 8 days ago. Since the tattoo was applied he has had redness of the area but felt this was normal. Yesterday he noted swelling of the left forearm and today the redness has increased and he noted swelling from his forearm to his fingers. He also noted tenderness to the area and to the left axilla. He denies fever, chills or myalgias. He drainage from the tattoo site. He rates his pain as 4/10 and feels it is mostly due to the pressure from the swelling. He has been taking tylenol for pain and using a moisturizer on the area.    Review of Systems  Constitutional: Negative for fever.  HENT: Negative.   Gastrointestinal: Negative for nausea and vomiting.  Musculoskeletal: Positive for joint swelling. Negative for myalgias.  Skin: Positive for color change and wound.  Neurological: Negative for light-headedness.  Hematological: Positive for adenopathy.       Objective: BP (!) 140/91 (BP Location: Right Arm, Patient Position: Sitting, Cuff Size: Large)   Pulse 89   Temp 99.4 F (37.4 C) (Oral)   Resp 12   Ht 6\' 4"  (1.93 m)   Wt 251 lb (113.9 kg)   SpO2 97%   BMI 30.55 kg/m     Physical Exam Vitals and nursing note reviewed.  Constitutional:      General: He is not in acute distress. HENT:     Head: Normocephalic.  Eyes:     Conjunctiva/sclera: Conjunctivae normal.  Cardiovascular:     Rate and Rhythm: Normal rate.  Pulmonary:     Effort: Pulmonary effort is normal.     Breath sounds: Normal air entry.  Chest:  Breasts:     Left: Axillary adenopathy present.    Musculoskeletal:     Left forearm: Swelling and tenderness present.     Left wrist: Swelling and  tenderness present.     Left hand: Swelling present.     Cervical back: Neck supple.  Lymphadenopathy:     Upper Body:     Left upper body: Axillary adenopathy present.  Skin:    Findings: Erythema present.     Comments: Left forearm with erythema in area of new tattoo  Neurological:     Mental Status: He is alert.  Psychiatric:        Mood and Affect: Mood normal.        Behavior: Behavior normal.        Thought Content: Thought content normal.       Assessment & Plan:  1. Need for DTaP vaccine - Tdap vaccine greater than or equal to 7yo IM  2. Enlarged lymph nodes in armpit  3. Disorder of skin due to tattoo ink - predniSONE (DELTASONE) 20 MG tablet; Take 1 tablet (20 mg total) by mouth 2 (two) times daily with a meal.  Dispense: 10 tablet; Refill: 0  4. Wound infection - cephALEXin (KEFLEX) 500 MG capsule; Take 1 capsule (500 mg total) by mouth 4 (four) times daily.  Dispense: 28 capsule; Refill: 0 Return tomorrow for recheck. If symptoms worsen such as fever, increased swelling or redness, red streaks, go to the ED immediately.  Discussed  with the patient in detail dx and treatment plan and need for close attention to the area. Discussed problems associated with wound infections of the hand and forearm and need for close follow up. Patient given the opportunity to ask questions. All questioned fully answered and patient voices understanding. He will return tomorrow for recheck since the clinic will be closed for Christmas after tomorrow. If any problems arise he will go to the ED for further evaluation. Verbal and written instructions given to the patient.

## 2020-02-07 NOTE — Patient Instructions (Signed)
Wound Infection A wound infection happens when germs start to grow in a wound. Germs that cause wound infections are most often bacteria. Other types of infections can occur as well. An infection can cause the wound to break open. Wound infections need treatment. If a wound infection is not treated, problems can happen. What are the causes?  Most often caused by germs (bacteria) that grow in a wound.  Other germs, such as yeast and funguses, can also cause wound infections. What increases the risk?  Having a weak body defense system (immune system).  Having diabetes.  Taking certain medicines (steroids) for a long time.  Smoking.  Being an older person.  Being overweight.  Taking certain medicines for cancer treatment. What are the signs or symptoms?  Having more redness, swelling, or pain at the wound site.  Having more blood or fluid at the wound site.  A bad smell coming from a wound or bandage (dressing).  Having a fever.  Feeling very tired.  Having warmth at or around the wound.  Having pus at the wound site. How is this treated?  This condition is most often treated with an antibiotic medicine. ? The infection should improve 24-48 hours after you start antibiotics. ? After 24-48 hours, redness around the wound should stop spreading. The wound should also be less painful. Follow these instructions at home: Medicines  Take or apply over-the-counter and prescription medicines only as told by your doctor.  If you were prescribed an antibiotic medicine, take or apply it as told by your doctor. Do not stop using the antibiotic even if you start to feel better. Wound care   Clean the wound each day, or as told by your doctor. ? Wash the wound with mild soap and water. ? Rinse the wound with water to remove all soap. ? Pat the wound dry with a clean towel. Do not rub it.  Follow instructions from your doctor about how to take care of your wound. Make sure  you: ? Wash your hands with soap and water before and after you change your bandage. If you cannot use soap and water, use hand sanitizer. ? Change your bandage as told by your doctor. ? Leave stitches (sutures), skin glue, or skin tape (adhesive) strips in place if your wound has been closed. They may need to stay in place for 2 weeks or longer. If tape strips get loose and curl up, you may trim the loose edges. Do not remove tape strips completely unless your doctor says it is okay. Some wounds are left open to heal on their own.  Check your wound every day for signs of infection. Watch for: ? More redness, swelling, or pain. ? More fluid or blood. ? Warmth. ? Pus or a bad smell. General instructions  Keep the bandage dry until your doctor says it can be removed.  Do not take baths, swim, or use a hot tub until your doctor approves. Ask your doctor if you may take showers. You may only be allowed to take sponge baths.  Raise (elevate) the injured area above the level of your heart while you are sitting or lying down.  Do not scratch or pick at the wound.  Keep all follow-up visits as told by your doctor. This is important. Contact a doctor if:  Medicine does not help your pain.  You have more redness, swelling, or pain around your wound.  You have more fluid or blood coming from your wound.    Your wound feels warm to the touch.  You have pus coming from your wound.  You notice a bad smell coming from your wound or your bandage.  Your wound that was closed breaks open. Get help right away if:  You have a red streak going away from your wound.  You have a fever. Summary  A wound infection happens when germs start to grow in a wound.  This condition is usually treated with an antibiotic medicine.  Follow instructions from your doctor about how to take care of your wound.  Contact a doctor if your wound infection does not start to get better in 24-48 hours, or your  symptoms get worse.  Keep all follow-up visits as told by your doctor. This is important. This information is not intended to replace advice given to you by your health care provider. Make sure you discuss any questions you have with your health care provider. Document Revised: 09/15/2017 Document Reviewed: 09/15/2017 Elsevier Patient Education  2020 [employer]  

## 2020-02-11 ENCOUNTER — Emergency Department: Payer: 59

## 2020-02-11 ENCOUNTER — Encounter: Payer: Self-pay | Admitting: Emergency Medicine

## 2020-02-11 ENCOUNTER — Other Ambulatory Visit: Payer: Self-pay

## 2020-02-11 ENCOUNTER — Inpatient Hospital Stay
Admission: EM | Admit: 2020-02-11 | Discharge: 2020-02-12 | DRG: 872 | Disposition: A | Discharge status: Home / Self Care | Payer: 59 | Attending: Internal Medicine | Admitting: Internal Medicine

## 2020-02-11 DIAGNOSIS — L089 Local infection of the skin and subcutaneous tissue, unspecified: Secondary | ICD-10-CM

## 2020-02-11 DIAGNOSIS — F32A Depression, unspecified: Secondary | ICD-10-CM | POA: Diagnosis present

## 2020-02-11 DIAGNOSIS — L03114 Cellulitis of left upper limb: Secondary | ICD-10-CM | POA: Diagnosis not present

## 2020-02-11 DIAGNOSIS — Z8249 Family history of ischemic heart disease and other diseases of the circulatory system: Secondary | ICD-10-CM

## 2020-02-11 DIAGNOSIS — Z720 Tobacco use: Secondary | ICD-10-CM | POA: Diagnosis not present

## 2020-02-11 DIAGNOSIS — Z8659 Personal history of other mental and behavioral disorders: Secondary | ICD-10-CM

## 2020-02-11 DIAGNOSIS — A419 Sepsis, unspecified organism: Secondary | ICD-10-CM | POA: Diagnosis not present

## 2020-02-11 DIAGNOSIS — L818 Other specified disorders of pigmentation: Secondary | ICD-10-CM | POA: Diagnosis not present

## 2020-02-11 DIAGNOSIS — Z20822 Contact with and (suspected) exposure to covid-19: Secondary | ICD-10-CM | POA: Diagnosis not present

## 2020-02-11 DIAGNOSIS — Z79899 Other long term (current) drug therapy: Secondary | ICD-10-CM | POA: Diagnosis not present

## 2020-02-11 DIAGNOSIS — R000 Tachycardia, unspecified: Secondary | ICD-10-CM | POA: Diagnosis not present

## 2020-02-11 DIAGNOSIS — R69 Illness, unspecified: Secondary | ICD-10-CM | POA: Diagnosis not present

## 2020-02-11 DIAGNOSIS — M7989 Other specified soft tissue disorders: Secondary | ICD-10-CM | POA: Diagnosis not present

## 2020-02-11 LAB — CBC WITH DIFFERENTIAL/PLATELET
Abs Immature Granulocytes: 0.28 10*3/uL — ABNORMAL HIGH (ref 0.00–0.07)
Basophils Absolute: 0.1 10*3/uL (ref 0.0–0.1)
Basophils Relative: 0 %
Eosinophils Absolute: 0 10*3/uL (ref 0.0–0.5)
Eosinophils Relative: 0 %
HCT: 47.1 % (ref 39.0–52.0)
Hemoglobin: 15.8 g/dL (ref 13.0–17.0)
Immature Granulocytes: 2 %
Lymphocytes Relative: 25 %
Lymphs Abs: 4.5 10*3/uL — ABNORMAL HIGH (ref 0.7–4.0)
MCH: 29.4 pg (ref 26.0–34.0)
MCHC: 33.5 g/dL (ref 30.0–36.0)
MCV: 87.5 fL (ref 80.0–100.0)
Monocytes Absolute: 1.1 10*3/uL — ABNORMAL HIGH (ref 0.1–1.0)
Monocytes Relative: 6 %
Neutro Abs: 12 10*3/uL — ABNORMAL HIGH (ref 1.7–7.7)
Neutrophils Relative %: 67 %
Platelets: 348 10*3/uL (ref 150–400)
RBC: 5.38 MIL/uL (ref 4.22–5.81)
RDW: 11.9 % (ref 11.5–15.5)
WBC: 17.6 10*3/uL — ABNORMAL HIGH (ref 4.0–10.5)
nRBC: 0 % (ref 0.0–0.2)

## 2020-02-11 LAB — COMPREHENSIVE METABOLIC PANEL
ALT: 46 U/L — ABNORMAL HIGH (ref 0–44)
AST: 25 U/L (ref 15–41)
Albumin: 4.2 g/dL (ref 3.5–5.0)
Alkaline Phosphatase: 68 U/L (ref 38–126)
Anion gap: 12 (ref 5–15)
BUN: 21 mg/dL — ABNORMAL HIGH (ref 6–20)
CO2: 23 mmol/L (ref 22–32)
Calcium: 9.4 mg/dL (ref 8.9–10.3)
Chloride: 104 mmol/L (ref 98–111)
Creatinine, Ser: 1.14 mg/dL (ref 0.61–1.24)
GFR, Estimated: >60 mL/min (ref >60)
Glucose, Bld: 139 mg/dL — ABNORMAL HIGH (ref 70–99)
Potassium: 3.8 mmol/L (ref 3.5–5.1)
Sodium: 139 mmol/L (ref 135–145)
Total Bilirubin: 0.4 mg/dL (ref 0.3–1.2)
Total Protein: 8.9 g/dL — ABNORMAL HIGH (ref 6.5–8.1)

## 2020-02-11 LAB — PROTIME-INR
INR: 1 (ref 0.8–1.2)
Prothrombin Time: 12.7 seconds (ref 11.4–15.2)

## 2020-02-11 LAB — RESP PANEL BY RT-PCR (FLU A&B, COVID) ARPGX2
Influenza A by PCR: NEGATIVE
Influenza B by PCR: NEGATIVE
SARS Coronavirus 2 by RT PCR: NEGATIVE

## 2020-02-11 LAB — APTT: aPTT: 27 seconds (ref 24–36)

## 2020-02-11 LAB — LACTIC ACID, PLASMA: Lactic Acid, Venous: 1.4 mmol/L (ref 0.5–1.9)

## 2020-02-11 MED ORDER — SODIUM CHLORIDE 0.9 % IV BOLUS (SEPSIS)
500.0000 mL | Freq: Once | INTRAVENOUS | Status: AC
  Administered 2020-02-11: 23:00:00 500 mL via INTRAVENOUS

## 2020-02-11 MED ORDER — SODIUM CHLORIDE 0.9 % IV BOLUS (SEPSIS)
1000.0000 mL | Freq: Once | INTRAVENOUS | Status: AC
  Administered 2020-02-11: 21:00:00 1000 mL via INTRAVENOUS

## 2020-02-11 MED ORDER — SODIUM CHLORIDE 0.9 % IV SOLN
2.0000 g | Freq: Once | INTRAVENOUS | Status: AC
  Administered 2020-02-11: 23:00:00 2 g via INTRAVENOUS
  Filled 2020-02-11: qty 2

## 2020-02-11 MED ORDER — HYDROCODONE-ACETAMINOPHEN 5-325 MG PO TABS
1.0000 | ORAL_TABLET | ORAL | Status: DC | PRN
  Administered 2020-02-12: 2 via ORAL
  Filled 2020-02-11: qty 2

## 2020-02-11 MED ORDER — ONDANSETRON HCL 4 MG/2ML IJ SOLN: 4.0000 mg | Freq: Four times a day (QID) | INTRAMUSCULAR | Status: DC | PRN

## 2020-02-11 MED ORDER — VANCOMYCIN HCL IN DEXTROSE 1-5 GM/200ML-% IV SOLN: 1000.0000 mg | Freq: Once | INTRAVENOUS | Status: DC

## 2020-02-11 MED ORDER — SODIUM CHLORIDE 0.9 % IV BOLUS (SEPSIS)
1000.0000 mL | Freq: Once | INTRAVENOUS | Status: AC
  Administered 2020-02-11: 20:00:00 1000 mL via INTRAVENOUS

## 2020-02-11 MED ORDER — LACTATED RINGERS IV SOLN: INTRAVENOUS | Status: DC

## 2020-02-11 MED ORDER — MORPHINE SULFATE (PF) 2 MG/ML IV SOLN
2.0000 mg | INTRAVENOUS | Status: DC | PRN
  Administered 2020-02-11: 2 mg via INTRAVENOUS
  Filled 2020-02-11: qty 1

## 2020-02-11 MED ORDER — KETOROLAC TROMETHAMINE 30 MG/ML IJ SOLN
30.0000 mg | Freq: Once | INTRAMUSCULAR | Status: AC
  Administered 2020-02-11: 21:00:00 30 mg via INTRAVENOUS
  Filled 2020-02-11: qty 1

## 2020-02-11 MED ORDER — VANCOMYCIN HCL IN DEXTROSE 1-5 GM/200ML-% IV SOLN
1000.0000 mg | Freq: Once | INTRAVENOUS | Status: AC
  Administered 2020-02-11: 22:00:00 1000 mg via INTRAVENOUS
  Filled 2020-02-11: qty 200

## 2020-02-11 MED ORDER — ACETAMINOPHEN 325 MG PO TABS: 650.0000 mg | ORAL_TABLET | Freq: Four times a day (QID) | ORAL | Status: DC | PRN

## 2020-02-11 MED ORDER — SODIUM CHLORIDE 0.9 % IV SOLN
2.0000 g | Freq: Once | INTRAVENOUS | Status: AC
  Administered 2020-02-11: 20:00:00 2 g via INTRAVENOUS
  Filled 2020-02-11 (×2): qty 20

## 2020-02-11 MED ORDER — SODIUM CHLORIDE 0.9 % IV SOLN
2.0000 g | Freq: Three times a day (TID) | INTRAVENOUS | Status: DC
  Administered 2020-02-12: 09:00:00 2 g via INTRAVENOUS
  Filled 2020-02-11 (×3): qty 2

## 2020-02-11 MED ORDER — ACETAMINOPHEN 650 MG RE SUPP: 650.0000 mg | Freq: Four times a day (QID) | RECTAL | Status: DC | PRN

## 2020-02-11 MED ORDER — VANCOMYCIN HCL IN DEXTROSE 1-5 GM/200ML-% IV SOLN
1000.0000 mg | Freq: Once | INTRAVENOUS | Status: AC
  Administered 2020-02-11: 21:00:00 1000 mg via INTRAVENOUS
  Filled 2020-02-11: qty 200

## 2020-02-11 MED ORDER — KETOROLAC TROMETHAMINE 30 MG/ML IJ SOLN: 30.0000 mg | Freq: Four times a day (QID) | INTRAMUSCULAR | Status: DC | PRN

## 2020-02-11 MED ORDER — ENOXAPARIN SODIUM 60 MG/0.6ML ~~LOC~~ SOLN
60.0000 mg | SUBCUTANEOUS | Status: DC
  Administered 2020-02-12: 10:00:00 60 mg via SUBCUTANEOUS
  Filled 2020-02-11: qty 0.6

## 2020-02-11 MED ORDER — ONDANSETRON HCL 4 MG PO TABS: 4.0000 mg | ORAL_TABLET | Freq: Four times a day (QID) | ORAL | Status: DC | PRN

## 2020-02-11 NOTE — Progress Notes (Signed)
Elink following for sepsis protocol. 

## 2020-02-11 NOTE — ED Provider Notes (Signed)
[facility] Emergency Department Provider Note   ____________________________________________   Event Date/Time   First MD Initiated Contact with Patient 02/11/20 1954     (approximate)  I have reviewed the triage vital signs and the nursing notes.   HISTORY  Chief Complaint Infected Tattoo    HPI Allen Wilson is a 24 y.o. male with a stated past medical history of tachycardia who presents for an infection to the left forearm after a tattoo that was inked 2 weeks prior to arrival.  Patient states initially it was only read as usual tattoos tend to be until about a week ago when he noticed this redness getting much worse and was placed on prednisone and Keflex on 12/21.  Since that time patient notes that this wound has become increasingly red, painful, indurated, and with purulent drainage.  Patient describes the pain as a burning, 10/10, throbbing pain that radiates down to his hand and is worse with palpation.  Patient denies any relieving factors.  Over the last 2 days patient endorses associated fevers.  Patient has tried ibuprofen and Tylenol at home with no relief.  Patient currently denies any vision changes, tinnitus, difficulty speaking, facial droop, sore throat, chest pain, shortness of breath, abdominal pain, nausea/vomiting/diarrhea, dysuria, or weakness/numbness/paresthesias in any extremity         Past Medical History:  Diagnosis Date  . Tachycardia     Patient Active Problem List   Diagnosis Date Noted  . Severe major depression, single episode (HCC) 02/16/2018  . Poisoning by anticholinergic drug, intentional self-harm, initial encounter (HCC) 02/15/2018  . Tachycardia 02/15/2018  . Acute psychosis (HCC)   . Drug overdose 09/17/2016    Past Surgical History:  Procedure Laterality Date  . TYMPANOSTOMY TUBE PLACEMENT     childhood    Prior to Admission medications   Medication Sig Start Date End Date Taking?  Authorizing Provider  ALPRAZolam Prudy Feeler) 0.5 MG tablet Take 1 tablet by mouth daily. 05/27/18   [provider]  cephALEXin (KEFLEX) 500 MG capsule Take 1 capsule (500 mg total) by mouth 4 (four) times daily. 02/07/20   Janne Napoleon, NP  cyclobenzaprine (FLEXERIL) 10 MG tablet Take 10 mg by mouth at bedtime. 12/31/18   [provider]  metoprolol tartrate (LOPRESSOR) 25 MG tablet Take 1 tablet (25 mg total) by mouth 2 (two) times daily as needed (for heart rate greater than 110). 02/19/18   Clapacs, Jackquline Denmark, MD  predniSONE (DELTASONE) 20 MG tablet Take 1 tablet (20 mg total) by mouth 2 (two) times daily with a meal. 02/07/20   Janne Napoleon, NP    Allergies Patient has no known allergies.  Family History  Problem Relation Age of Onset  . Other Mother        alive & well.  . Hypertension Father   . Heart attack Maternal Grandfather        x 2 - first in his 83's.    Social History Social History   Tobacco Use  . Smoking status: Never Smoker  . Smokeless tobacco: Current User    Types: Chew  Vaping Use  . Vaping Use: Never used  Substance Use Topics  . Alcohol use: Yes    Alcohol/week: 3.0 standard drinks    Types: 3 Cans of beer per week  . Drug use: No    Review of Systems Constitutional: Endorses fever/chills Eyes: No visual changes. ENT: No sore throat. Cardiovascular: Denies chest pain. Respiratory:  Denies shortness of breath. Gastrointestinal: No abdominal pain.  No nausea, no vomiting.  No diarrhea. Genitourinary: Negative for dysuria. Musculoskeletal: Negative for acute arthralgias Skin: Positive for erythema, induration, and purulent drainage from the left forearm Neurological: Negative for headaches, weakness/numbness/paresthesias in any extremity Psychiatric: Negative for suicidal ideation/homicidal ideation   ____________________________________________   PHYSICAL EXAM:  VITAL SIGNS: ED Triage Vitals  Enc Vitals Group     BP 02/11/20  1905 (!) 146/101     Pulse Rate 02/11/20 1905 (!) 106     Resp 02/11/20 1905 16     Temp 02/11/20 1905 99.1 F (37.3 C)     Temp Source 02/11/20 1905 Oral     SpO2 02/11/20 1905 97 %     Weight 02/11/20 1906 251 lb (113.9 kg)     Height 02/11/20 1906 6\' 4"  (1.93 m)     Head Circumference --      Peak Flow --      Pain Score 02/11/20 1906 4     Pain Loc --      Pain Edu? --      Excl. in GC? --    Constitutional: Alert and oriented. Well appearing and in no acute distress. Eyes: Conjunctivae are normal. PERRL. Head: Atraumatic. Nose: No congestion/rhinnorhea. Mouth/Throat: Mucous membranes are moist. Neck: No stridor Cardiovascular: Grossly normal heart sounds.  Good peripheral circulation. Respiratory: Normal respiratory effort.  No retractions. Gastrointestinal: Soft and nontender. No distention. Musculoskeletal: No obvious deformities Neurologic:  Normal speech and language. No gross focal neurologic deficits are appreciated. Skin:  Skin is warm and dry.  Circumferential erythema and purulent drainage over the entire left forearm onto the dorsum of the hand with induration and extremely painful to palpation Psychiatric: Mood and affect are normal. Speech and behavior are normal.       ____________________________________________   LABS (all labs ordered are listed, but only abnormal results are displayed)  Labs Reviewed  COMPREHENSIVE METABOLIC PANEL - Abnormal; Notable for the following components:      Result Value   Glucose, Bld 139 (*)    BUN 21 (*)    Total Protein 8.9 (*)    ALT 46 (*)    All other components within normal limits  CBC WITH DIFFERENTIAL/PLATELET - Abnormal; Notable for the following components:   WBC 17.6 (*)    Neutro Abs 12.0 (*)    Lymphs Abs 4.5 (*)    Monocytes Absolute 1.1 (*)    Abs Immature Granulocytes 0.28 (*)    All other components within normal limits  RESP PANEL BY RT-PCR (FLU A&B, COVID) ARPGX2  CULTURE, BLOOD (ROUTINE X  2)  CULTURE, BLOOD (ROUTINE X 2)  URINE CULTURE  AEROBIC CULTURE (SUPERFICIAL SPECIMEN)  LACTIC ACID, PLASMA  LACTIC ACID, PLASMA  PROTIME-INR  APTT  URINALYSIS, COMPLETE (UACMP) WITH MICROSCOPIC   ____________________________________________  EKG  ED ECG REPORT I, 02/13/20, the attending physician, personally viewed and interpreted this ECG.  Date: 02/11/2020 EKG Time: 2118 Rate: 76 Rhythm: normal sinus rhythm QRS Axis: normal Intervals: normal ST/T Wave abnormalities: normal Narrative Interpretation: no evidence of acute ischemia  ____________________________________________  RADIOLOGY  ED MD interpretation: 2 view x-ray of the left forearm shows a small focus of gas on the distal ulnar forearm that coincides with a tract of tattoo ink.  No other evidence of subcutaneous free air  Official radiology report(s): DG Forearm Left  Result Date: 02/11/2020 CLINICAL DATA:  Infection EXAM: LEFT FOREARM - 2 VIEW  COMPARISON:  None. FINDINGS: There is soft tissue swelling about the forearm. There is a questionable area of subcutaneous gas involving the distal forearm on the ulnar side. There is no radiopaque foreign body. There is no acute displaced fracture or dislocation. IMPRESSION: Soft tissue swelling about the forearm with a questionable area of subcutaneous gas involving the distal forearm on the ulnar side. Findings are concerning for cellulitis. The small focus of gas may be related to the reported tattoo or could be secondary to an infection with a gas-forming organism. No acute displaced fracture or dislocation. No radiopaque foreign body. Electronically Signed   By: Katherine Mantle M.D.   On: 02/11/2020 20:25    ____________________________________________   PROCEDURES  Procedure(s) performed (including Critical Care):  .1-3 Lead EKG Interpretation Performed by: Merwyn Katos, MD Authorized by: Merwyn Katos, MD     Interpretation: normal     ECG  rate:  83   ECG rate assessment: normal     Rhythm: sinus rhythm     Ectopy: none     Conduction: normal       ____________________________________________   INITIAL IMPRESSION / ASSESSMENT AND PLAN / ED COURSE  As part of my medical decision making, I reviewed the following data within the electronic MEDICAL RECORD NUMBER Nursing notes reviewed and incorporated, Labs reviewed, EKG interpreted, Old chart reviewed, Radiograph reviewed and Notes from prior ED visits reviewed and incorporated      Presentation most consistent with complicated cellulitis that has failed outpatient antibiotic treatment. Given History, Exam, and Workup I have low suspicion for Necrotizing Fasciitis, Abscess, Osteomyelitis, DVT or other emergent problem as a cause for this presentation.  Empiric antibiotics of vancomycin and Rocephin Wound culture sent No evidence of septic shock  Disposition: Admit     ____________________________________________   FINAL CLINICAL IMPRESSION(S) / ED DIAGNOSES  Final diagnoses:  None     ED Discharge Orders    None       Note:  This document was prepared using Dragon voice recognition software and may include unintentional dictation errors.   Merwyn Katos, MD 02/11/20 [phone]

## 2020-02-11 NOTE — ED Triage Notes (Signed)
Pt arrived via POV with reports of infected tattoo to L forearm. Pt got tattoo 2 weeks ago, pt is a IT sales professional and states worsening infection since this week.  Pt seen by city provider on 12/21 and prescribed steroids and antibiotics but states continues to be infected.  Pt reports swelling has improved.  Pt has arm wrapped at this time.

## 2020-02-11 NOTE — Progress Notes (Signed)
Made aware of 24 yo male two wks out from L forearm tatoo, failing outpt treatment with PO Keflex and prednisone, with c/o increasing pain.   Photos reviewed, WBC 17.6K, reported fevers and burning throbbing pain.   Advised due to risk of limb function to consult orthopedics re: need for excisional debridement/I&D.   Will anticipate full consult to follow in AM.

## 2020-02-11 NOTE — H&P (Signed)
History and Physical    Jamichael Knotts ZOX:[id number] DOB: 1995-04-22 DOA: 02/11/2020  PCP: Patient, No Pcp Per   Patient coming from: Home  I have personally briefly reviewed patient's old medical records in [facility]  Chief Complaint: Pain and swelling left forearm after getting a tattoo  HPI: Laiken Nohr is a 24 y.o. male with medical history significant for depression who presents to the emergency room with pain, redness and swelling of the left forearm associated with purulent oozing after getting a tattoo 2 weeks prior.  He was seen on 12/21 and prescribed Keflex and prednisone, but the area became increasingly red and painful.  The pain is described as burning  and throbbing of severe intensity radiating from the forearm down to his hand, worse with palpation.  He has had no relief with over-the-counter ibuprofen and Tylenol.  He denies decreased strength or numbness in the fingers, wrist or elbow. ED Course:  in the emergency room he had a fever of 99.4, BP 140/91, heart rate 106 with O2 sat 97% on room air.  Blood work significant for leukocytosis of 17,000 and lactic acid 1.4.  Other labs mostly unremarkable EKG: My interpretation: Normal sinus rhythm at 96 Imaging: X-ray left forearm: "Soft tissue swelling about the forearm with a questionable area of subcutaneous gas involving the distal forearm on the ulnar side. Findings are concerning for cellulitis. The small focus of gas may be related to the reported tattoo or could be secondary to an infection with a gas-forming organism  The emergency room provider contacted surgeon on-call Dr. Dolores Frame who recommended orthopedics.  Subsequently spoke with Ortho, Dr. Loralie Champagne who recommended hospitalist admission and will see patient in the a.m.  Review of Systems: As per HPI otherwise all other systems on review of systems negative.    Past Medical History:  Diagnosis Date  . Tachycardia     Past  Surgical History:  Procedure Laterality Date  . TYMPANOSTOMY TUBE PLACEMENT     childhood     reports that he has never smoked. His smokeless tobacco use includes chew. He reports current alcohol use of about 3.0 standard drinks of alcohol per week. He reports that he does not use drugs.  No Known Allergies  Family History  Problem Relation Age of Onset  . Other Mother        alive & well.  . Hypertension Father   . Heart attack Maternal Grandfather        x 2 - first in his 55's.      Prior to Admission medications   Medication Sig Start Date End Date Taking? Authorizing Provider  ALPRAZolam Prudy Feeler) 0.5 MG tablet Take 1 tablet by mouth daily. 05/27/18   [provider]  cephALEXin (KEFLEX) 500 MG capsule Take 1 capsule (500 mg total) by mouth 4 (four) times daily. 02/07/20   Janne Napoleon, NP  cyclobenzaprine (FLEXERIL) 10 MG tablet Take 10 mg by mouth at bedtime. 12/31/18   [provider]  metoprolol tartrate (LOPRESSOR) 25 MG tablet Take 1 tablet (25 mg total) by mouth 2 (two) times daily as needed (for heart rate greater than 110). 02/19/18   Clapacs, Jackquline Denmark, MD  predniSONE (DELTASONE) 20 MG tablet Take 1 tablet (20 mg total) by mouth 2 (two) times daily with a meal. 02/07/20   Janne Napoleon, NP    Physical Exam: Vitals:   02/11/20 1905 02/11/20 1906 02/11/20 2000 02/11/20 2120  BP: (!) 146/101  Marland Kitchen)  146/87 (!) 138/97  Pulse: (!) 106  99 84  Resp: 16  18 19   Temp: 99.1 F (37.3 C)     TempSrc: Oral     SpO2: 97%  94% 95%  Weight:  113.9 kg    Height:  6\' 4"  (1.93 m)       Vitals:   02/11/20 1905 02/11/20 1906 02/11/20 2000 02/11/20 2120  BP: (!) 146/101  (!) 146/87 (!) 138/97  Pulse: (!) 106  99 84  Resp: 16  18 19   Temp: 99.1 F (37.3 C)     TempSrc: Oral     SpO2: 97%  94% 95%  Weight:  113.9 kg    Height:  6\' 4"  (1.93 m)        Constitutional: Alert and oriented x 3 . Not in any apparent distress HEENT:      Head: Normocephalic and  atraumatic.         Eyes: PERLA, EOMI, Conjunctivae are normal. Sclera is non-icteric.       Mouth/Throat: Mucous membranes are moist.       Neck: Supple with no signs of meningismus. Cardiovascular: Regular rate and rhythm. No murmurs, gallops, or rubs. 2+ symmetrical distal pulses are present . No JVD. No LE edema Respiratory: Respiratory effort normal .Lungs sounds clear bilaterally. No wheezes, crackles, or rhonchi.  Gastrointestinal: Soft, non tender, and non distended with positive bowel sounds.  Genitourinary: No CVA tenderness. Musculoskeletal: Nontender with normal range of motion in all extremities. No cyanosis, . Neurologic:  Face is symmetric. Moving all extremities. No gross focal neurologic deficits . Skin:  Circumferential erythema and purulent drainage extending over the entire left forearm onto the dorsum of the hand with  painful to palpation Psychiatric: Mood and affect are normal    Labs on Admission: I have personally reviewed following labs and imaging studies  CBC: Recent Labs  Lab 02/11/20 1906  WBC 17.6*  NEUTROABS 12.0*  HGB 15.8  HCT 47.1  MCV 87.5  PLT 348   Basic Metabolic Panel: Recent Labs  Lab 02/11/20 1906  NA 139  K 3.8  CL 104  CO2 23  GLUCOSE 139*  BUN 21*  CREATININE 1.14  CALCIUM 9.4   GFR: Estimated Creatinine Clearance: 139.1 mL/min (by C-G formula based on SCr of 1.14 mg/dL). Liver Function Tests: Recent Labs  Lab 02/11/20 1906  AST 25  ALT 46*  ALKPHOS 68  BILITOT 0.4  PROT 8.9*  ALBUMIN 4.2   No results for input(s): LIPASE, AMYLASE in the last 168 hours. No results for input(s): AMMONIA in the last 168 hours. Coagulation Profile: Recent Labs  Lab 02/11/20 2020  INR 1.0   Cardiac Enzymes: No results for input(s): CKTOTAL, CKMB, CKMBINDEX, TROPONINI in the last 168 hours. BNP (last 3 results) No results for input(s): PROBNP in the last 8760 hours. HbA1C: No results for input(s): HGBA1C in the last 72  hours. CBG: No results for input(s): GLUCAP in the last 168 hours. Lipid Profile: No results for input(s): CHOL, HDL, LDLCALC, TRIG, CHOLHDL, LDLDIRECT in the last 72 hours. Thyroid Function Tests: No results for input(s): TSH, T4TOTAL, FREET4, T3FREE, THYROIDAB in the last 72 hours. Anemia Panel: No results for input(s): VITAMINB12, FOLATE, FERRITIN, TIBC, IRON, RETICCTPCT in the last 72 hours. Urine analysis:    Component Value Date/Time   COLORURINE YELLOW (A) 09/17/2016 1644   APPEARANCEUR CLEAR (A) 09/17/2016 1644   LABSPEC 1.012 09/17/2016 1644   PHURINE 7.0 09/17/2016 1644  GLUCOSEU NEGATIVE 09/17/2016 1644   HGBUR NEGATIVE 09/17/2016 1644   BILIRUBINUR Negative 05/23/2019 0911   KETONESUR NEGATIVE 09/17/2016 1644   PROTEINUR Positive (A) 05/23/2019 0911   PROTEINUR NEGATIVE 09/17/2016 1644   UROBILINOGEN 0.2 05/23/2019 0911   NITRITE Negative 05/23/2019 0911   NITRITE NEGATIVE 09/17/2016 1644   LEUKOCYTESUR Negative 05/23/2019 0911    Radiological Exams on Admission: DG Forearm Left  Result Date: 02/11/2020 CLINICAL DATA:  Infection EXAM: LEFT FOREARM - 2 VIEW COMPARISON:  None. FINDINGS: There is soft tissue swelling about the forearm. There is a questionable area of subcutaneous gas involving the distal forearm on the ulnar side. There is no radiopaque foreign body. There is no acute displaced fracture or dislocation. IMPRESSION: Soft tissue swelling about the forearm with a questionable area of subcutaneous gas involving the distal forearm on the ulnar side. Findings are concerning for cellulitis. The small focus of gas may be related to the reported tattoo or could be secondary to an infection with a gas-forming organism. No acute displaced fracture or dislocation. No radiopaque foreign body. Electronically Signed   By: Katherine Mantle M.D.   On: 02/11/2020 20:25     Assessment/Plan 24 year old male with history of depression presenting with pain, redness and  swelling of the left forearm associated with purulent oozing after getting a tattoo 2 weeks prior, not improving with OTC pain meds and a prescription for Keflex and prednisone given on 12/21.      Cellulitis of left forearm, secondary to recent tattoo   Sepsis [facility]) -Patient received tattoo 2 weeks prior and has since had redness swelling and pain that has been progressing associated with fevers. -Sepsis criteria includes fever, tachycardia and leukocytosis.  Lactic acid 1.4 -Chest x-ray:Soft tissue swelling about the forearm with a questionable area of subcutaneous gas involving the distal forearm on the ulnar side. Findings are concerning for cellulitis. The small focus of gas may be related to the reported tattoo or could be secondary to an infection with a gas-forming organism. -Cefepime and vancomycin -Status post IV bolus in the ER.  Continue with maintenance fluids -Keep arm elevated, cool compresses for comfort -Pain medication -Orthopedic consult    History of depression -Not currently on medication    DVT prophylaxis: Lovenox  Code Status: full code  Family Communication:  none  Disposition Plan: Back to previous home environment Consults called: Orthopedics Status:At the time of admission, it appears that the appropriate admission status for this patient is INPATIENT. This is judged to be reasonable and necessary in order to provide the required intensity of service to ensure the patient's safety given the presenting symptoms, physical exam findings, and initial radiographic and laboratory data in the context of their  Comorbid conditions.   Patient requires inpatient status due to high intensity of service, high risk for further deterioration and high frequency of surveillance required.   I certify that at the point of admission it is my clinical judgment that the patient will require inpatient hospital care spanning beyond 2 midnights     Andris Baumann MD [facility]     02/11/2020, 10:05 PM

## 2020-02-11 NOTE — Progress Notes (Signed)
PHARMACY -  BRIEF ANTIBIOTIC NOTE   Pharmacy has received consult(s) for vancomycin from an ED provider.  The patient's profile has been reviewed for ht/wt/allergies/indication/available labs.    One time order(s) placed for vancomycin 2000mg  IV x1  Further antibiotics/pharmacy consults should be ordered by admitting physician if indicated.                       , PharmD Clinical Pharmacist  02/11/2020   8:08 PM

## 2020-02-11 NOTE — Progress Notes (Signed)
CODE SEPSIS - PHARMACY COMMUNICATION  **Broad Spectrum Antibiotics should be administered within 1 hour of Sepsis diagnosis**  Time Code Sepsis Called/Page Received: 1956  Antibiotics Ordered: 1956  Time of 1st antibiotic administration: 2025  Additional action taken by pharmacy: increased dose of vancomycin to 2000mg  IV x1 since patient >100kg  If necessary, Name of Provider/Nurse Contacted: n/a    ,PharmD Clinical Pharmacist  02/11/2020  8:07 PM

## 2020-02-12 LAB — PROTIME-INR
INR: 1 (ref 0.8–1.2)
Prothrombin Time: 13.1 seconds (ref 11.4–15.2)

## 2020-02-12 LAB — COMPREHENSIVE METABOLIC PANEL
ALT: 32 U/L (ref 0–44)
AST: 17 U/L (ref 15–41)
Albumin: 2.9 g/dL — ABNORMAL LOW (ref 3.5–5.0)
Alkaline Phosphatase: 52 U/L (ref 38–126)
Anion gap: 6 (ref 5–15)
BUN: 22 mg/dL — ABNORMAL HIGH (ref 6–20)
CO2: 24 mmol/L (ref 22–32)
Calcium: 8 mg/dL — ABNORMAL LOW (ref 8.9–10.3)
Chloride: 110 mmol/L (ref 98–111)
Creatinine, Ser: 0.83 mg/dL (ref 0.61–1.24)
GFR, Estimated: >60 mL/min (ref >60)
Glucose, Bld: 90 mg/dL (ref 70–99)
Potassium: 3.7 mmol/L (ref 3.5–5.1)
Sodium: 140 mmol/L (ref 135–145)
Total Bilirubin: 0.5 mg/dL (ref 0.3–1.2)
Total Protein: 6.2 g/dL — ABNORMAL LOW (ref 6.5–8.1)

## 2020-02-12 LAB — CORTISOL-AM, BLOOD: Cortisol - AM: 1.2 ug/dL — ABNORMAL LOW (ref 6.7–22.6)

## 2020-02-12 LAB — HIV ANTIBODY (ROUTINE TESTING W REFLEX): HIV Screen 4th Generation wRfx: NONREACTIVE

## 2020-02-12 LAB — PROCALCITONIN: Procalcitonin: <0.1 ng/mL

## 2020-02-12 MED ORDER — VANCOMYCIN HCL IN DEXTROSE 1-5 GM/200ML-% IV SOLN
1000.0000 mg | Freq: Three times a day (TID) | INTRAVENOUS | Status: DC
  Administered 2020-02-12: 10:00:00 1000 mg via INTRAVENOUS
  Filled 2020-02-12 (×3): qty 200

## 2020-02-12 MED ORDER — HYDROCODONE-ACETAMINOPHEN 5-325 MG PO TABS: 1.0000 | ORAL_TABLET | Freq: Three times a day (TID) | ORAL | 0 refills | Status: DC | PRN

## 2020-02-12 MED ORDER — VANCOMYCIN HCL 1250 MG/250ML IV SOLN
1250.0000 mg | Freq: Three times a day (TID) | INTRAVENOUS | Status: DC
  Filled 2020-02-12 (×2): qty 250

## 2020-02-12 MED ORDER — IBUPROFEN 600 MG PO TABS: 600.0000 mg | ORAL_TABLET | Freq: Three times a day (TID) | ORAL | 0 refills | Status: AC

## 2020-02-12 MED ORDER — HYDROCODONE-ACETAMINOPHEN 5-325 MG PO TABS: 1.0000 | ORAL_TABLET | Freq: Three times a day (TID) | ORAL | 0 refills | Status: AC | PRN

## 2020-02-12 MED ORDER — IBUPROFEN 600 MG PO TABS: 600.0000 mg | ORAL_TABLET | Freq: Three times a day (TID) | ORAL | 0 refills | Status: DC

## 2020-02-12 MED ORDER — CEPHALEXIN 500 MG PO CAPS: 500.0000 mg | ORAL_CAPSULE | Freq: Two times a day (BID) | ORAL | 0 refills | Status: DC

## 2020-02-12 MED ORDER — CEPHALEXIN 500 MG PO CAPS: 500.0000 mg | ORAL_CAPSULE | Freq: Two times a day (BID) | ORAL | 0 refills | Status: AC

## 2020-02-12 NOTE — Progress Notes (Signed)
PHARMACIST - PHYSICIAN COMMUNICATION  CONCERNING:  Enoxaparin (Lovenox) for DVT Prophylaxis    RECOMMENDATION: Patient was prescribed enoxaprin 40mg  q24 hours for VTE prophylaxis.   Filed Weights   02/11/20 1906  Weight: 113.9 kg (251 lb)    Body mass index is 30.55 kg/m.  Estimated Creatinine Clearance: 139.1 mL/min (by C-G formula based on SCr of 1.14 mg/dL).   Based on [facility] policy patient is candidate for enoxaparin 0.5mg /kg TBW SQ every 24 hours based on BMI being >30.  DESCRIPTION: Pharmacy has adjusted enoxaparin dose per [facility] policy.  Patient is now receiving enoxaparin 60 mg every 24 hours    [facility], PharmD Clinical Pharmacist  02/12/2020 12:12 AM

## 2020-02-12 NOTE — Discharge Summary (Signed)
5        [place] [facility]   PATIENT NAME: Allen Wilson    MR#:  [id number]  DATE OF BIRTH:  March 05, 1995  DATE OF ADMISSION:  02/11/2020   ADMITTING PHYSICIAN: Andris Baumann, MD  DATE OF DISCHARGE: 02/12/2020  1:26 PM  PRIMARY CARE PHYSICIAN: Janne Napoleon, NP   ADMISSION DIAGNOSIS:  Cellulitis of left arm [L03.114] Cellulitis of left forearm [L03.114] DISCHARGE DIAGNOSIS:  Principal Problem:   Cellulitis of left forearm Active Problems:   History of depression   Sepsis (HCC)  SECONDARY DIAGNOSIS:   Past Medical History:  Diagnosis Date  . Tachycardia    HOSPITAL COURSE:  24 year old male admitted for left forearm cellulitis status post tattooing on his left forearm about 2 weeks ago.  Patient was seen and evaluated by orthopedics who recommended oral antibiotics as there was no noticeable abscess requiring any kind of surgical intervention.  He was requested to follow-up with his primary care physician next Tuesday.  He was also requested to contact ER and or his PCP if he has pain with range of motion of his fingers or if cellulitis is spreading more proximally or distally.  Patient was in agreement with the discharge planning.   DISCHARGE CONDITIONS:  Stable CONSULTS OBTAINED:  Treatment Team:  Campbell Lerner, MD DRUG ALLERGIES:  No Known Allergies DISCHARGE MEDICATIONS:   Allergies as of 02/12/2020   No Known Allergies     Medication List    STOP taking these medications   metoprolol tartrate 25 MG tablet Commonly known as: LOPRESSOR   predniSONE 20 MG tablet Commonly known as: DELTASONE     TAKE these medications   acetaminophen 500 MG tablet Commonly known as: TYLENOL Take 1,000 mg by mouth daily as needed for moderate pain.   ALPRAZolam 0.5 MG tablet Commonly known as: XANAX Take 1 tablet by mouth daily as needed for anxiety.   cephALEXin 500 MG capsule Commonly known as: KEFLEX Take 1 capsule (500 mg total) by  mouth 2 (two) times daily for 10 days. What changed: when to take this   cyclobenzaprine 10 MG tablet Commonly known as: FLEXERIL Take 10 mg by mouth at bedtime.   HYDROcodone-acetaminophen 5-325 MG tablet Commonly known as: NORCO/VICODIN Take 1 tablet by mouth every 8 (eight) hours as needed for up to 3 days for moderate pain or severe pain.   ibuprofen 600 MG tablet Commonly known as: ADVIL Take 1 tablet (600 mg total) by mouth 3 (three) times daily for 4 days.      DISCHARGE INSTRUCTIONS:   DIET:  Regular diet DISCHARGE CONDITION:  Good ACTIVITY:  Activity as tolerated OXYGEN:  Home Oxygen: No.  Oxygen Delivery: room air DISCHARGE LOCATION:  home   If you experience worsening of your admission symptoms, develop shortness of breath, life threatening emergency, suicidal or homicidal thoughts you must seek medical attention immediately by calling 911 or calling your MD immediately  if symptoms less severe.  You Must read complete instructions/literature along with all the possible adverse reactions/side effects for all the Medicines you take and that have been prescribed to you. Take any new Medicines after you have completely understood and accpet all the possible adverse reactions/side effects.   Please note  You were cared for by a hospitalist during your hospital stay. If you have any questions about your discharge medications or the care you received while you were in the hospital after you are discharged, you can call the  unit and asked to speak with the hospitalist on call if the hospitalist that took care of you is not available. Once you are discharged, your primary care physician will handle any further medical issues. Please note that NO REFILLS for any discharge medications will be authorized once you are discharged, as it is imperative that you return to your primary care physician (or establish a relationship with a primary care physician if you do not have one) for  your aftercare needs so that they can reassess your need for medications and monitor your lab values.    On the day of Discharge:  VITAL SIGNS:  Blood pressure 133/90, pulse 63, temperature 98.7 F (37.1 C), temperature source Oral, resp. rate 20, height 6\' 4"  (1.93 m), weight 113.9 kg, SpO2 98 %. PHYSICAL EXAMINATION:  GENERAL:  24 y.o.-year-old patient lying in the bed with no acute distress.  EYES: Pupils equal, round, reactive to light and accommodation. No scleral icterus. Extraocular muscles intact.  HEENT: Head atraumatic, normocephalic. Oropharynx and nasopharynx clear.  NECK:  Supple, no jugular venous distention. No thyroid enlargement, no tenderness.  LUNGS: Normal breath sounds bilaterally, no wheezing, rales,rhonchi or crepitation. No use of accessory muscles of respiration.  CARDIOVASCULAR: S1, S2 normal. No murmurs, rubs, or gallops.  ABDOMEN: Soft, non-tender, non-distended. Bowel sounds present. No organomegaly or mass.  EXTREMITIES: No pedal edema, cyanosis, or clubbing.  NEUROLOGIC: Cranial nerves II through XII are intact. Muscle strength 5/5 in all extremities. Sensation intact. Gait not checked.  PSYCHIATRIC: The patient is alert and oriented x 3.  SKIN: See the image            DATA REVIEW:   CBC Recent Labs  Lab 02/11/20 1906  WBC 17.6*  HGB 15.8  HCT 47.1  PLT 348    Chemistries  Recent Labs  Lab 02/12/20 0503  NA 140  K 3.7  CL 110  CO2 24  GLUCOSE 90  BUN 22*  CREATININE 0.83  CALCIUM 8.0*  AST 17  ALT 32  ALKPHOS 52  BILITOT 0.5     Outpatient follow-up  Follow-up Information    02/14/20, NP. Go on 02/14/2020.   Specialty: Nurse Practitioner Why: as scheduled Contact information: [address] 1st floor [place] [phone]                Management plans discussed with the patient, family and they are in agreement.  CODE STATUS: Full Code   TOTAL TIME TAKING CARE OF THIS  PATIENT: 35 minutes.    [phone] M.D on 02/12/2020 at 3:52 PM  [facility]   CC: Primary care physician; 02/14/2020, NP   Note: This dictation was prepared with Dragon dictation along with smaller phrase technology. Any transcriptional errors that result from this process are unintentional.

## 2020-02-12 NOTE — Consult Note (Signed)
Reason for Consult:Allen Wilson  Referring Physician: Delfino LovettShah, [name] is an 24 y.o. male.  HPI: Patient is a 24 years old male who is 2 weeks status post tattooing on his left forearm.  Patient developed swelling and redness of his left forearm and presented in the ER at [facility] last night.  Patient has been admitted to the hospitalist service for treatment of cellulitis of his left forearm.  He has been on IV antibiotics.  The orthopedic service has been consulted for possible abscess.  And works as a IT sales professionalfirefighter.  He denies any fever or chills.  He does not have any pain with range of motion of his left hand fingers.  Past Medical History:  Diagnosis Date  . Tachycardia     Past Surgical History:  Procedure Laterality Date  . TYMPANOSTOMY TUBE PLACEMENT     childhood    Family History  Problem Relation Age of Onset  . Other Mother        alive & well.  . Hypertension Father   . Heart attack Maternal Grandfather        x 2 - first in his 9940's.    Social History:  reports that he has never smoked. His smokeless tobacco use includes chew. He reports current alcohol use of about 3.0 standard drinks of alcohol per week. He reports that he does not use drugs.  Allergies: No Known Allergies  Medications: I have reviewed the patient's current medications.  Results for orders placed or performed during the hospital encounter of 02/11/20 (from the past 48 hour(s))  Lactic acid, plasma     Status: None   Collection Time: 02/11/20  7:06 PM  Result Value Ref Range   Lactic Acid, Venous 1.4 0.5 - 1.9 mmol/L    Comment: Performed at [facility], [address] [place], [place] [address]  Comprehensive metabolic panel     Status: Abnormal   Collection Time: 02/11/20  7:06 PM  Result Value Ref Range   Sodium 139 135 - 145 mmol/L   Potassium 3.8 3.5 - 5.1 mmol/L   Chloride 104 98 - 111 mmol/L   CO2 23 22 - 32 mmol/L   Glucose, Bld  139 (H) 70 - 99 mg/dL    Comment: Glucose reference range applies only to samples taken after fasting for at least 8 hours.   BUN 21 (H) 6 - 20 mg/dL   Creatinine, Ser 1.911.14 0.61 - 1.24 mg/dL   Calcium 9.4 8.9 - 47.810.3 mg/dL   Total Protein 8.9 (H) 6.5 - 8.1 g/dL   Albumin 4.2 3.5 - 5.0 g/dL   AST 25 15 - 41 U/L   ALT 46 (H) 0 - 44 U/L   Alkaline Phosphatase 68 38 - 126 U/L   Total Bilirubin 0.4 0.3 - 1.2 mg/dL   GFR, Estimated >29>60 >56>60 mL/min    Comment: (NOTE) Calculated using the CKD-EPI Creatinine Equation (2021)    Anion gap 12 5 - 15    Comment: Performed at [facility], [address] [place], [place] [address]  CBC with Differential     Status: Abnormal   Collection Time: 02/11/20  7:06 PM  Result Value Ref Range   WBC 17.6 (H) 4.0 - 10.5 K/uL   RBC 5.38 4.22 - 5.81 MIL/uL   Hemoglobin 15.8 13.0 - 17.0 g/dL   HCT 65.747.1 84.639.0 - 96.252.0 %   MCV 87.5 80.0 - 100.0 fL   MCH  29.4 26.0 - 34.0 pg   MCHC 33.5 30.0 - 36.0 g/dL   RDW 57.5 05.1 - 83.3 %   Platelets 348 150 - 400 K/uL   nRBC 0.0 0.0 - 0.2 %   Neutrophils Relative % 67 %   Neutro Abs 12.0 (H) 1.7 - 7.7 K/uL   Lymphocytes Relative 25 %   Lymphs Abs 4.5 (H) 0.7 - 4.0 K/uL   Monocytes Relative 6 %   Monocytes Absolute 1.1 (H) 0.1 - 1.0 K/uL   Eosinophils Relative 0 %   Eosinophils Absolute 0.0 0.0 - 0.5 K/uL   Basophils Relative 0 %   Basophils Absolute 0.1 0.0 - 0.1 K/uL   Immature Granulocytes 2 %   Abs Immature Granulocytes 0.28 (H) 0.00 - 0.07 K/uL    Comment: Performed at [facility], [address], [place], [place] [address]  Resp Panel by RT-PCR (Flu A&B, Covid) Nasopharyngeal Swab     Status: None   Collection Time: 02/11/20  8:02 PM   Specimen: Nasopharyngeal Swab; Nasopharyngeal(NP) swabs in vial transport medium  Result Value Ref Range   SARS Coronavirus 2 by RT PCR NEGATIVE NEGATIVE    Comment: (NOTE) SARS-CoV-2 target nucleic acids are NOT DETECTED.  The SARS-CoV-2 RNA is  generally detectable in upper respiratory specimens during the acute phase of infection. The lowest concentration of SARS-CoV-2 viral copies this assay can detect is 138 copies/mL. A negative result does not preclude SARS-Cov-2 infection and should not be used as the sole basis for treatment or other patient management decisions. A negative result may occur with  improper specimen collection/handling, submission of specimen other than nasopharyngeal swab, presence of viral mutation(s) within the areas targeted by this assay, and inadequate number of viral copies(<138 copies/mL). A negative result must be combined with clinical observations, patient history, and epidemiological information. The expected result is Negative.  Fact Sheet for Patients:  BloggerCourse.com  Fact Sheet for Healthcare Providers:  SeriousBroker.it  This test is no t yet approved or cleared by the [place] FDA and  has been authorized for detection and/or diagnosis of SARS-CoV-2 by FDA under an Emergency Use Authorization (EUA). This EUA will remain  in effect (meaning this test can be used) for the duration of the COVID-19 declaration under Section 564(b)(1) of the Act, 21 U.S.C.section 360bbb-3(b)(1), unless the authorization is terminated  or revoked sooner.       Influenza A by PCR NEGATIVE NEGATIVE   Influenza B by PCR NEGATIVE NEGATIVE    Comment: (NOTE) The Xpert Xpress SARS-CoV-2/FLU/RSV plus assay is intended as an aid in the diagnosis of influenza from Nasopharyngeal swab specimens and should not be used as a sole basis for treatment. Nasal washings and aspirates are unacceptable for Xpert Xpress SARS-CoV-2/FLU/RSV testing.  Fact Sheet for Patients: BloggerCourse.com  Fact Sheet for Healthcare Providers: SeriousBroker.it  This test is not yet approved or cleared by the [place] FDA  and has been authorized for detection and/or diagnosis of SARS-CoV-2 by FDA under an Emergency Use Authorization (EUA). This EUA will remain in effect (meaning this test can be used) for the duration of the COVID-19 declaration under Section 564(b)(1) of the Act, 21 U.S.C. section 360bbb-3(b)(1), unless the authorization is terminated or revoked.  Performed at [facility], [address] [place], [place] [address]   Protime-INR     Status: None   Collection Time: 02/11/20  8:20 PM  Result Value Ref Range   Prothrombin Time 12.7 11.4 - 15.2  seconds   INR 1.0 0.8 - 1.2    Comment: (NOTE) INR goal varies based on device and disease states. Performed at [facility], [address] [place], [place] [address]   APTT     Status: None   Collection Time: 02/11/20  8:20 PM  Result Value Ref Range   aPTT 27 24 - 36 seconds    Comment: Performed at [facility], [address] [place], [place] [address]  Blood Culture (routine x 2)     Status: None (Preliminary result)   Collection Time: 02/11/20  8:20 PM   Specimen: BLOOD  Result Value Ref Range   Specimen Description BLOOD BLOOD RIGHT FOREARM    Special Requests      BOTTLES DRAWN AEROBIC AND ANAEROBIC Blood Culture results may not be optimal due to an excessive volume of blood received in culture bottles   Culture      NO GROWTH < 12 HOURS Performed at [facility], [address] [place], [place] [address]    Report Status PENDING   Blood Culture (routine x 2)     Status: None (Preliminary result)   Collection Time: 02/11/20  8:20 PM   Specimen: BLOOD  Result Value Ref Range   Specimen Description BLOOD RIGHT ANTECUBITAL    Special Requests      BOTTLES DRAWN AEROBIC AND ANAEROBIC Blood Culture results may not be optimal due to an excessive volume of blood received in culture bottles   Culture      NO GROWTH < 12 HOURS Performed at [facility], [address],  [place], [place] [address]    Report Status PENDING   Wound or Superficial Culture     Status: None (Preliminary result)   Collection Time: 02/11/20  8:59 PM   Specimen: Wound  Result Value Ref Range   Specimen Description      WOUND FOREARM LEFT Performed at [facility], [address], [place], [place] [address]    Special Requests      Normal Performed at [facility], [address], [place], [place] [address]    Gram Stain      NO WBC SEEN RARE GRAM POSITIVE COCCI Performed at [facility], [address] [place], [place] [address]    Culture PENDING    Report Status PENDING   HIV Antibody (routine testing w rflx)     Status: None   Collection Time: 02/12/20  5:03 AM  Result Value Ref Range   HIV Screen 4th Generation wRfx Non Reactive Non Reactive    Comment: Performed at [facility], [address], [place], [place] [address]  Protime-INR     Status: None   Collection Time: 02/12/20  5:03 AM  Result Value Ref Range   Prothrombin Time 13.1 11.4 - 15.2 seconds   INR 1.0 0.8 - 1.2    Comment: (NOTE) INR goal varies based on device and disease states. Performed at [facility], [address] [place], [place] [address]   Cortisol-am, blood     Status: Abnormal   Collection Time: 02/12/20  5:03 AM  Result Value Ref Range   Cortisol - AM 1.2 (L) 6.7 - 22.6 ug/dL    Comment: Performed at [facility], [address], [place], [place] [address]  Procalcitonin     Status: None   Collection Time: 02/12/20  5:03 AM  Result Value Ref Range   Procalcitonin <0.10  ng/mL    Comment:        Interpretation: PCT (Procalcitonin) <= 0.5 ng/mL: Systemic infection (sepsis) is not likely. Local bacterial infection is possible. (NOTE)       Sepsis PCT Algorithm           Lower Respiratory Tract                                      Infection PCT Algorithm    ----------------------------     ----------------------------         PCT <  0.25 ng/mL                PCT < 0.10 ng/mL          Strongly encourage             Strongly discourage   discontinuation of antibiotics    initiation of antibiotics    ----------------------------     -----------------------------       PCT 0.25 - 0.50 ng/mL            PCT 0.10 - 0.25 ng/mL               OR       >80% decrease in PCT            Discourage initiation of                                            antibiotics      Encourage discontinuation           of antibiotics    ----------------------------     -----------------------------         PCT >= 0.50 ng/mL              PCT 0.26 - 0.50 ng/mL               AND        <80% decrease in PCT             Encourage initiation of                                             antibiotics       Encourage continuation           of antibiotics    ----------------------------     -----------------------------        PCT >= 0.50 ng/mL                  PCT > 0.50 ng/mL               AND         increase in PCT                  Strongly encourage                                      initiation of antibiotics    Strongly encourage escalation           of antibiotics                                     -----------------------------  PCT <= 0.25 ng/mL                                                 OR                                        > 80% decrease in PCT                                      Discontinue / Do not initiate                                             antibiotics  Performed at [facility], [address] [place], [place] [address]   Comprehensive metabolic panel     Status: Abnormal   Collection Time: 02/12/20  5:03 AM  Result Value Ref Range   Sodium 140 135 - 145 mmol/L   Potassium 3.7 3.5 - 5.1 mmol/L   Chloride 110 98 - 111 mmol/L   CO2 24 22 - 32 mmol/L   Glucose, Bld 90 70 - 99 mg/dL    Comment: Glucose reference range applies only to samples taken after  fasting for at least 8 hours.   BUN 22 (H) 6 - 20 mg/dL   Creatinine, Ser 8.93 0.61 - 1.24 mg/dL   Calcium 8.0 (L) 8.9 - 10.3 mg/dL   Total Protein 6.2 (L) 6.5 - 8.1 g/dL   Albumin 2.9 (L) 3.5 - 5.0 g/dL   AST 17 15 - 41 U/L   ALT 32 0 - 44 U/L   Alkaline Phosphatase 52 38 - 126 U/L   Total Bilirubin 0.5 0.3 - 1.2 mg/dL   GFR, Estimated >81 >01 mL/min    Comment: (NOTE) Calculated using the CKD-EPI Creatinine Equation (2021)    Anion gap 6 5 - 15    Comment: Performed at [facility], [address] [place], [place] [address]    DG Forearm Left  Result Date: 02/11/2020 CLINICAL DATA:  Infection EXAM: LEFT FOREARM - 2 VIEW COMPARISON:  None. FINDINGS: There is soft tissue swelling about the forearm. There is a questionable area of subcutaneous gas involving the distal forearm on the ulnar side. There is no radiopaque foreign body. There is no acute displaced fracture or dislocation. IMPRESSION: Soft tissue swelling about the forearm with a questionable area of subcutaneous gas involving the distal forearm on the ulnar side. Findings are concerning for cellulitis. The small focus of gas may be related to the reported tattoo or could be secondary to an infection with a gas-forming organism. No acute displaced fracture or dislocation. No radiopaque foreign body. Electronically Signed   By: Katherine Mantle M.D.   On: 02/11/2020 20:25    Review of Systems Blood pressure 133/90, pulse 63, temperature 98.7 F (37.1 C), temperature source Oral, resp. rate 20, height 6\' 4"  (1.93 m), weight 113.9 kg, SpO2 98 %. Physical Exam General: Patient is awake alert and oriented. HEENT: Normocephalic atraumatic Chest: Normal shape normal breathing Abdomen: Soft nontender nondistended Neurological: He has intact  function of radial median ulnar nerve in sensorimotor distribution in his left upper extremity Musculoskeletal: Left forearm dressing was removed.  Patient has mild erythema  involving the distal two third of his forearm.  However there is no fluctuation or any purulent drainage at this time.  He does show raw ink from the tattooing but does not show any significant erythema as such.  There is no pain with range of motion active or passive of his left hand fingers.  Forearm compartments are soft.  Assessment/Plan: 24 years old male 2 weeks status post left forearm tattooing with superficial cellulitis.  Currently patient does not have any noticeable abscess that needs any surgical intervention.  He does have superficial cellulitis that should respond to oral antibiotics.  Patient can follow-up with his primary care physician on outpatient basis.  Patient should immediately contact the ER if he has pain with range of motion of his fingers are more spreading cellulitis proximally or distally.  Patient agrees with this plan.  Alayla Dethlefs 02/12/2020, 12:24 PM

## 2020-02-12 NOTE — Progress Notes (Signed)
Pharmacy Antibiotic Note  Allen Wilson is a 24 y.o. male admitted on 02/11/2020 with cellulitis.  Pharmacy has been consulted for Vancomycin and Cefepime dosing.  Plan: Cefepime 2gm IV q8hrs Vancomycin 2gm x 1 then 1gm IV q8hrs (per nomogram)  Height: 6\' 4"  (193 cm) Weight: 113.9 kg (251 lb) IBW/kg (Calculated) : 86.8  Temp (24hrs), Avg:98.7 F (37.1 C), Min:98.2 F (36.8 C), Max:99.1 F (37.3 C)  Recent Labs  Lab 02/11/20 1906  WBC 17.6*  CREATININE 1.14  LATICACIDVEN 1.4    Estimated Creatinine Clearance: 139.1 mL/min (by C-G formula based on SCr of 1.14 mg/dL).    No Known Allergies  Antimicrobials this admission:   >>    >>   Dose adjustments this admission:   Microbiology results:  BCx:   UCx:    Sputum:    MRSA PCR:   Thank you for allowing pharmacy to be a part of this patient's care.  02/13/20 A 02/12/2020 12:01 AM

## 2020-02-12 NOTE — Progress Notes (Signed)
Pharmacy Antibiotic Note  Allen Wilson is a 24 y.o. male admitted on 02/11/2020 with cellulitis.  He presents to the emergency room with pain, redness and swelling of the left forearm associated with purulent oozing after getting a tattoo 2 weeks prior.  He was seen on 12/21 and prescribed Keflex and prednisone, but the area became increasingly red and painful. Pharmacy was consulted for vancomycin and cefepime dosing. His renal function appears stable and at apparent baseline.  Plan:  1) continue cefepime 2 grams IV every 8 hours  2) adjust vancomycin dose to 1250 mg IV every 8 hours  Ke: 0.145 h-1  T1/2: 4.5 h  Css (calculated): 31.9 / 12.4 mcg/mL  Daily SCr to assess renal function while on vancomycin  Levels as clinically required (goal 10 - 15 mcg/mL)  Height: 6\' 4"  (193 cm) Weight: 113.9 kg (251 lb) IBW/kg (Calculated) : 86.8  Temp (24hrs), Avg:98.5 F (36.9 C), Min:98.2 F (36.8 C), Max:99.1 F (37.3 C)  Recent Labs  Lab 02/11/20 1906 02/12/20 0503  WBC 17.6*  --   CREATININE 1.14 0.83  LATICACIDVEN 1.4  --     Estimated Creatinine Clearance: 191.1 mL/min (by C-G formula based on SCr of 0.83 mg/dL).    No Known Allergies  Antimicrobials this admission: vancomycin 12/25  >>  cefepime 12/25  >>   Microbiology results: 12/25 BCx: NG < 12 hours 12/25 UCx: pending 12/25 WCx: rare GPC 12/25 SARS CoV-2: negative 12/25 influenza A/B: negative  Thank you for allowing pharmacy to be a part of this patient's care.  1/26 02/12/2020 9:37 AM

## 2020-02-12 NOTE — Progress Notes (Signed)
Discharge order received. Patient mental status is at baseline. Vital signs stable . No signs of acute distress. Discharge instructions given. Patient verbalized understanding. No other issues noted at this time.   

## 2020-02-14 ENCOUNTER — Ambulatory Visit: Payer: Self-pay | Admitting: Physician Assistant

## 2020-02-14 ENCOUNTER — Encounter: Payer: Self-pay | Admitting: Physician Assistant

## 2020-02-14 ENCOUNTER — Other Ambulatory Visit: Payer: Self-pay

## 2020-02-14 VITALS — BP 139/93 | HR 114 | Temp 98.5°F | Resp 14

## 2020-02-14 DIAGNOSIS — L03114 Cellulitis of left upper limb: Secondary | ICD-10-CM

## 2020-02-14 NOTE — Progress Notes (Signed)
   Subjective: Cellulitis    Patient ID: Allen Wilson, male    DOB: 09-26-1995, 24 y.o.   MRN: [id number]  HPI Patient presents with cellulitis of the left forearm secondary to a tattoo.  Patient was seen at this facility last week and started on oral antibiotics.  Patient states redness and swelling increased and he went to the ED and was admitted on Christmas Day.  Inpatient status consist of IV hydration and oral antibiotics.  Patient released after 24 hours.  Advised to continue oral antibiotics. Review of Systems    Anxiety Objective:   Physical Exam Decreased edema and erythema from previous visit.       Assessment & Plan: Resolving cellulitis of the left forearm.  Patient advised continue previous medication consists of antibiotics and pain medication.  Patient follow low work status and will be reevaluated and 5 days.

## 2020-02-14 NOTE — Progress Notes (Signed)
Taking another Abx & hydrocodone that was prescribed for him at [facility].  Next scheduled work shift is 02/15/30.  AMD

## 2020-02-15 LAB — AEROBIC CULTURE W GRAM STAIN (SUPERFICIAL SPECIMEN)
Gram Stain: NONE SEEN
Special Requests: NORMAL

## 2020-02-16 LAB — CULTURE, BLOOD (ROUTINE X 2)
Culture: NO GROWTH
Culture: NO GROWTH

## 2020-02-23 ENCOUNTER — Ambulatory Visit: Payer: Self-pay | Admitting: Physician Assistant

## 2020-02-23 ENCOUNTER — Other Ambulatory Visit: Payer: Self-pay

## 2020-02-23 ENCOUNTER — Encounter: Payer: Self-pay | Admitting: Physician Assistant

## 2020-02-23 VITALS — BP 144/96 | HR 81 | Temp 98.0°F | Ht 72.0 in | Wt 244.2 lb

## 2020-02-23 DIAGNOSIS — L03114 Cellulitis of left upper limb: Secondary | ICD-10-CM

## 2020-02-23 NOTE — Progress Notes (Signed)
   Subjective: Cellulitis left forearm    Patient ID: Allen Wilson, male    DOB: 1995/09/21, 25 y.o.   MRN: [id number]  HPI Patient return for cellulitis of the left forearm secondary to a tattoo requiring hospital admission on 02/11/2020.  After 24 hours patient was released status post IV antibiotics and placed on oral antibiotics.  Patient states complaint has resolved but still has some mild skin irritation and flaking.   Review of Systems Negative septal complaint.    Objective:   Physical Exam No acute distress.  Examination left forearm reveals mild edema and flaking skin.  No erythema.  Nontender to palpation.  Sensation is intact.  Patient has full and equal range of motion of the forearm.       Assessment & Plan: Resolving cellulitis left forearm  Patient will complete last dose of antibiotics today.  Patient may return back to full duty.  Follow-up as necessary.

## 2020-06-08 NOTE — Progress Notes (Signed)
Scheduled to complete physical 06/25/20 - Provider TBD.  AMD  1+ protein on 2021 physical  AMD

## 2020-06-11 ENCOUNTER — Ambulatory Visit: Payer: Self-pay

## 2020-06-11 ENCOUNTER — Other Ambulatory Visit: Payer: Self-pay

## 2020-06-11 DIAGNOSIS — Z0000 Encounter for general adult medical examination without abnormal findings: Secondary | ICD-10-CM

## 2020-06-11 LAB — POCT URINALYSIS DIPSTICK
Appearance: NORMAL
Bilirubin, UA: NEGATIVE
Blood, UA: NEGATIVE
Glucose, UA: NEGATIVE
Ketones, UA: NEGATIVE
Leukocytes, UA: NEGATIVE
Nitrite, UA: NEGATIVE
Protein, UA: POSITIVE — AB
Spec Grav, UA: 1.02 (ref 1.010–1.025)
Urobilinogen, UA: 0.2 E.U./dL
pH, UA: 6.5 (ref 5.0–8.0)

## 2020-06-12 LAB — CMP12+LP+TP+TSH+6AC+CBC/D/PLT
ALT: 41 IU/L (ref 0–44)
AST: 21 IU/L (ref 0–40)
Albumin/Globulin Ratio: 1.6 (ref 1.2–2.2)
Albumin: 4.9 g/dL (ref 4.1–5.2)
Alkaline Phosphatase: 80 IU/L (ref 44–121)
BUN/Creatinine Ratio: 14 (ref 9–20)
BUN: 15 mg/dL (ref 6–20)
Basophils Absolute: 0 10*3/uL (ref 0.0–0.2)
Basos: 1 %
Bilirubin Total: 0.3 mg/dL (ref 0.0–1.2)
Calcium: 10 mg/dL (ref 8.7–10.2)
Chloride: 98 mmol/L (ref 96–106)
Chol/HDL Ratio: 6.1 ratio — ABNORMAL HIGH (ref 0.0–5.0)
Cholesterol, Total: 221 mg/dL — ABNORMAL HIGH (ref 100–199)
Creatinine, Ser: 1.09 mg/dL (ref 0.76–1.27)
EOS (ABSOLUTE): 0 10*3/uL (ref 0.0–0.4)
Eos: 1 %
Estimated CHD Risk: 1.3 times avg. — ABNORMAL HIGH (ref 0.0–1.0)
Free Thyroxine Index: 1.7 (ref 1.2–4.9)
GGT: 27 IU/L (ref 0–65)
Globulin, Total: 3 g/dL (ref 1.5–4.5)
Glucose: 88 mg/dL (ref 65–99)
HDL: 36 mg/dL — ABNORMAL LOW (ref >39)
Hematocrit: 50.7 % (ref 37.5–51.0)
Hemoglobin: 17.1 g/dL (ref 13.0–17.7)
Immature Grans (Abs): 0 10*3/uL (ref 0.0–0.1)
Immature Granulocytes: 1 %
Iron: 106 ug/dL (ref 38–169)
LDH: 154 IU/L (ref 121–224)
LDL Chol Calc (NIH): 126 mg/dL — ABNORMAL HIGH (ref 0–99)
Lymphocytes Absolute: 2.4 10*3/uL (ref 0.7–3.1)
Lymphs: 38 %
MCH: 29.3 pg (ref 26.6–33.0)
MCHC: 33.7 g/dL (ref 31.5–35.7)
MCV: 87 fL (ref 79–97)
Monocytes Absolute: 0.5 10*3/uL (ref 0.1–0.9)
Monocytes: 8 %
Neutrophils Absolute: 3.4 10*3/uL (ref 1.4–7.0)
Neutrophils: 51 %
Phosphorus: 2.8 mg/dL (ref 2.8–4.1)
Platelets: 232 10*3/uL (ref 150–450)
Potassium: 4.3 mmol/L (ref 3.5–5.2)
RBC: 5.84 x10E6/uL — ABNORMAL HIGH (ref 4.14–5.80)
RDW: 12.4 % (ref 11.6–15.4)
Sodium: 140 mmol/L (ref 134–144)
T3 Uptake Ratio: 26 % (ref 24–39)
T4, Total: 6.7 ug/dL (ref 4.5–12.0)
TSH: 1.25 u[IU]/mL (ref 0.450–4.500)
Total Protein: 7.9 g/dL (ref 6.0–8.5)
Triglycerides: 331 mg/dL — ABNORMAL HIGH (ref 0–149)
Uric Acid: 8.5 mg/dL — ABNORMAL HIGH (ref 3.8–8.4)
VLDL Cholesterol Cal: 59 mg/dL — ABNORMAL HIGH (ref 5–40)
WBC: 6.4 10*3/uL (ref 3.4–10.8)
eGFR: 97 mL/min/{1.73_m2} (ref >59)

## 2020-06-28 ENCOUNTER — Ambulatory Visit: Payer: Self-pay | Admitting: Physician Assistant

## 2020-06-28 ENCOUNTER — Other Ambulatory Visit: Payer: Self-pay

## 2020-06-28 ENCOUNTER — Encounter: Payer: Self-pay | Admitting: Physician Assistant

## 2020-06-28 VITALS — BP 128/90 | HR 80 | Temp 98.5°F | Resp 12 | Ht 73.0 in | Wt 252.0 lb

## 2020-06-28 DIAGNOSIS — Z0000 Encounter for general adult medical examination without abnormal findings: Secondary | ICD-10-CM

## 2020-06-28 MED ORDER — ROSUVASTATIN CALCIUM 20 MG PO TABS
20.0000 mg | ORAL_TABLET | Freq: Every day | ORAL | 3 refills | Status: DC
Start: 2020-06-28 — End: 2021-06-11

## 2020-06-28 NOTE — Progress Notes (Signed)
   Subjective: Annual firefighter exam    Patient ID: Allen Wilson, male    DOB: 1995-04-22, 25 y.o.   MRN: [id number]  HPI Patient presents for annual exam for employment.  Patient voices no concerns or complaints.   Review of Systems    Anxiety Objective:   Physical Exam No acute distress.  BP 128/90, pulse 80, respiration 12, temperature 98.5, weight 252 pounds. No acute distress.  HEENT is unremarkable.  Neck is supple active without adenopathy or bruits.  Lungs are clear to auscultation.  Heart regular rate and rhythm. Abdomen with negative HSM, normoactive bowel sounds, soft, and nontender to palpation. No obvious deformity of the upper or lower extremities.  Patient has full and equal range of motion of the upper and lower extremities. No obvious deformity to the cervical or lumbar spine.  Patient has full and equal range of motion of the cervical lumbar spine. Cranial nerves II through XII grossly intact.      Assessment & Plan: Well exam.  Gust patient lab results which is consistent with hyperlipidemia.  Cholesterol is 221, triglycerides 331, HDL was 36, VLDL 59, and LDL was 126. Patient is amenable to starting statins.  Crestor 20 mg daily with follow-up in 3 months.

## 2020-09-24 ENCOUNTER — Other Ambulatory Visit: Payer: 59

## 2021-04-16 DIAGNOSIS — Z79899 Other long term (current) drug therapy: Secondary | ICD-10-CM | POA: Diagnosis not present

## 2021-04-16 DIAGNOSIS — R69 Illness, unspecified: Secondary | ICD-10-CM | POA: Diagnosis not present

## 2021-04-16 DIAGNOSIS — F410 Panic disorder [episodic paroxysmal anxiety] without agoraphobia: Secondary | ICD-10-CM | POA: Diagnosis not present

## 2021-05-15 DIAGNOSIS — F410 Panic disorder [episodic paroxysmal anxiety] without agoraphobia: Secondary | ICD-10-CM | POA: Diagnosis not present

## 2021-05-15 DIAGNOSIS — R69 Illness, unspecified: Secondary | ICD-10-CM | POA: Diagnosis not present

## 2021-05-31 IMAGING — DX DG FOREARM 2V*L*
2 series · 2 of 2 positions shown · non-contrast
Comparison: None.

CLINICAL DATA: Infection

EXAM:
LEFT FOREARM - 2 VIEW

[forearm ap]
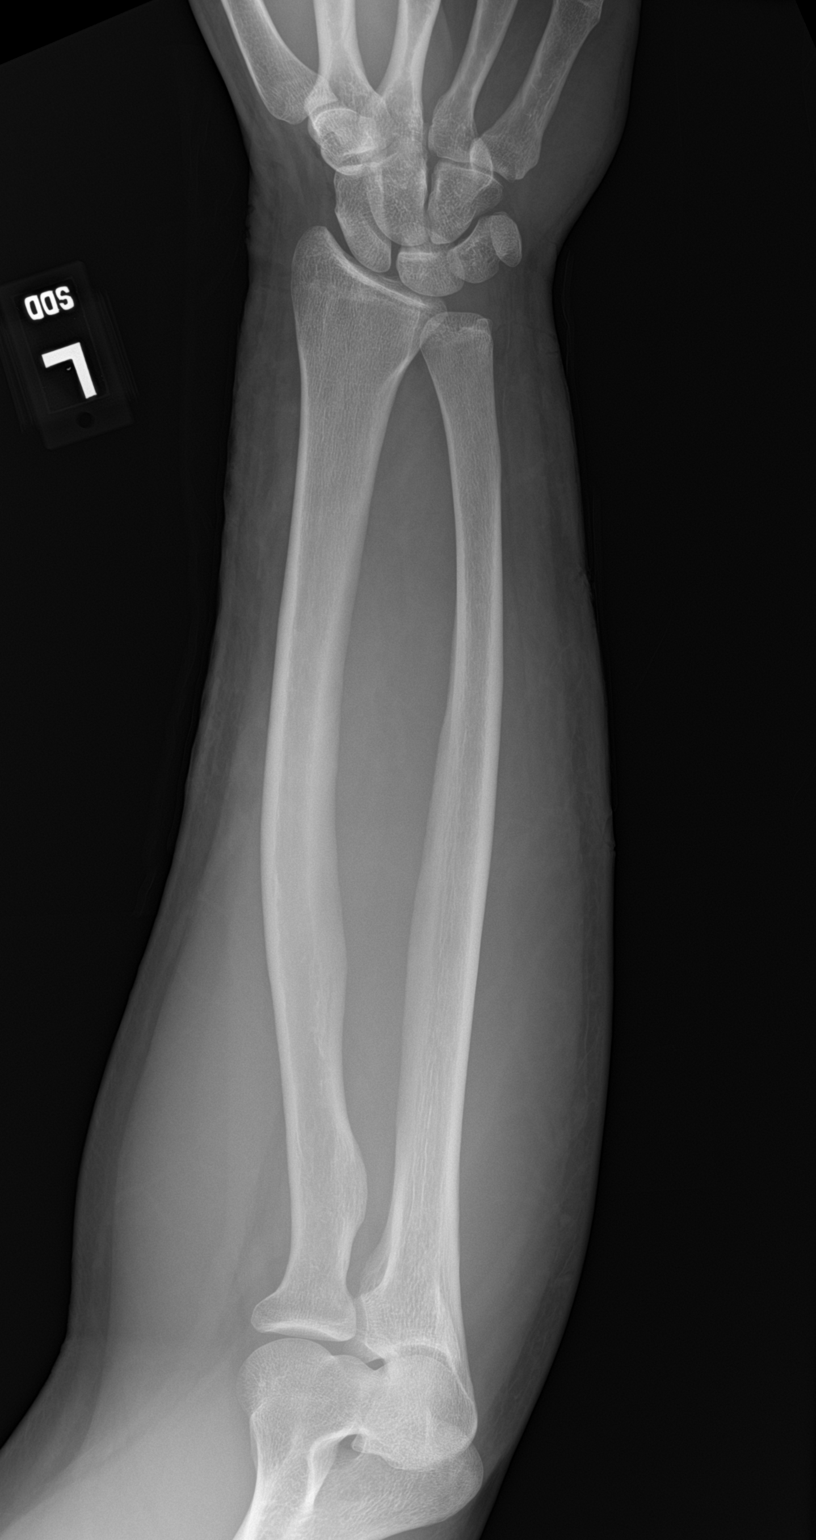

[forearm lat]
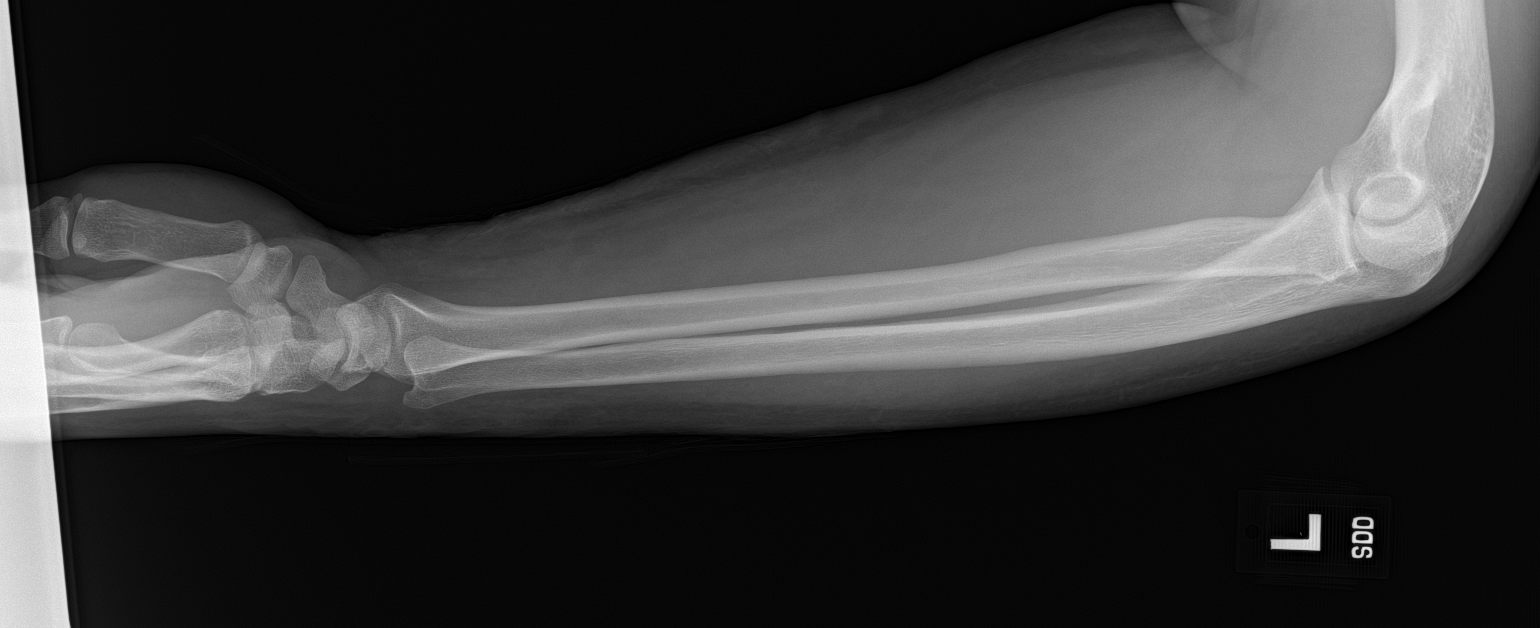

[2 of 2 positions shown; findings below may reference images not displayed]

FINDINGS: There is soft tissue swelling about the forearm. There is a
questionable area of subcutaneous gas involving the distal forearm
on the ulnar side. There is no radiopaque foreign body. There is no
acute displaced fracture or dislocation.
IMPRESSION: Soft tissue swelling about the forearm with a questionable area of
subcutaneous gas involving the distal forearm on the ulnar side.
Findings are concerning for cellulitis. The small focus of gas may
be related to the reported tattoo or could be secondary to an
infection with a gas-forming organism.

No acute displaced fracture or dislocation. No radiopaque foreign
body.

## 2021-06-05 ENCOUNTER — Ambulatory Visit: Payer: Self-pay

## 2021-06-05 DIAGNOSIS — Z0000 Encounter for general adult medical examination without abnormal findings: Secondary | ICD-10-CM

## 2021-06-05 LAB — POCT URINALYSIS DIPSTICK
Bilirubin, UA: NEGATIVE
Blood, UA: NEGATIVE
Glucose, UA: NEGATIVE
Ketones, UA: NEGATIVE
Leukocytes, UA: NEGATIVE
Nitrite, UA: NEGATIVE
Protein, UA: POSITIVE — AB
Spec Grav, UA: 1.015 (ref 1.010–1.025)
Urobilinogen, UA: 0.2 E.U./dL
pH, UA: 8 (ref 5.0–8.0)

## 2021-06-05 NOTE — Progress Notes (Signed)
Pt presents today for physical labs, will return to clinic for scheduled physical.  

## 2021-06-06 LAB — CMP12+LP+TP+TSH+6AC+CBC/D/PLT
ALT: 48 IU/L — ABNORMAL HIGH (ref 0–44)
AST: 23 IU/L (ref 0–40)
Albumin/Globulin Ratio: 1.7 (ref 1.2–2.2)
Albumin: 4.9 g/dL (ref 4.1–5.2)
Alkaline Phosphatase: 73 IU/L (ref 44–121)
BUN/Creatinine Ratio: 14 (ref 9–20)
BUN: 14 mg/dL (ref 6–20)
Basophils Absolute: 0.1 10*3/uL (ref 0.0–0.2)
Basos: 1 %
Bilirubin Total: 0.4 mg/dL (ref 0.0–1.2)
Calcium: 10 mg/dL (ref 8.7–10.2)
Chloride: 103 mmol/L (ref 96–106)
Chol/HDL Ratio: 5.1 ratio — ABNORMAL HIGH (ref 0.0–5.0)
Cholesterol, Total: 194 mg/dL (ref 100–199)
Creatinine, Ser: 1.02 mg/dL (ref 0.76–1.27)
EOS (ABSOLUTE): 0 10*3/uL (ref 0.0–0.4)
Eos: 0 %
Estimated CHD Risk: 1.1 times avg. — ABNORMAL HIGH (ref 0.0–1.0)
Free Thyroxine Index: 2.5 (ref 1.2–4.9)
GGT: 28 IU/L (ref 0–65)
Globulin, Total: 2.9 g/dL (ref 1.5–4.5)
Glucose: 82 mg/dL (ref 70–99)
HDL: 38 mg/dL — ABNORMAL LOW (ref >39)
Hematocrit: 52.5 % — ABNORMAL HIGH (ref 37.5–51.0)
Hemoglobin: 18.1 g/dL — ABNORMAL HIGH (ref 13.0–17.7)
Immature Grans (Abs): 0 10*3/uL (ref 0.0–0.1)
Immature Granulocytes: 0 %
Iron: 118 ug/dL (ref 38–169)
LDH: 147 IU/L (ref 121–224)
LDL Chol Calc (NIH): 123 mg/dL — ABNORMAL HIGH (ref 0–99)
Lymphocytes Absolute: 2.3 10*3/uL (ref 0.7–3.1)
Lymphs: 33 %
MCH: 29.5 pg (ref 26.6–33.0)
MCHC: 34.5 g/dL (ref 31.5–35.7)
MCV: 86 fL (ref 79–97)
Monocytes Absolute: 0.5 10*3/uL (ref 0.1–0.9)
Monocytes: 7 %
Neutrophils Absolute: 4.2 10*3/uL (ref 1.4–7.0)
Neutrophils: 59 %
Phosphorus: 3 mg/dL (ref 2.8–4.1)
Platelets: 243 10*3/uL (ref 150–450)
Potassium: 4.4 mmol/L (ref 3.5–5.2)
RBC: 6.13 x10E6/uL — ABNORMAL HIGH (ref 4.14–5.80)
RDW: 12.8 % (ref 11.6–15.4)
Sodium: 144 mmol/L (ref 134–144)
T3 Uptake Ratio: 30 % (ref 24–39)
T4, Total: 8.3 ug/dL (ref 4.5–12.0)
TSH: 1.25 u[IU]/mL (ref 0.450–4.500)
Total Protein: 7.8 g/dL (ref 6.0–8.5)
Triglycerides: 187 mg/dL — ABNORMAL HIGH (ref 0–149)
Uric Acid: 8.9 mg/dL — ABNORMAL HIGH (ref 3.8–8.4)
VLDL Cholesterol Cal: 33 mg/dL (ref 5–40)
WBC: 7 10*3/uL (ref 3.4–10.8)
eGFR: 105 mL/min/{1.73_m2} (ref >59)

## 2021-06-11 ENCOUNTER — Encounter: Payer: Self-pay | Admitting: Physician Assistant

## 2021-06-11 ENCOUNTER — Ambulatory Visit: Payer: Self-pay | Admitting: Physician Assistant

## 2021-06-11 VITALS — BP 135/94 | HR 82 | Temp 97.6°F | Resp 14 | Ht 73.0 in | Wt 232.0 lb

## 2021-06-11 DIAGNOSIS — R69 Illness, unspecified: Secondary | ICD-10-CM | POA: Diagnosis not present

## 2021-06-11 DIAGNOSIS — Z0000 Encounter for general adult medical examination without abnormal findings: Secondary | ICD-10-CM

## 2021-06-11 DIAGNOSIS — F410 Panic disorder [episodic paroxysmal anxiety] without agoraphobia: Secondary | ICD-10-CM | POA: Diagnosis not present

## 2021-06-11 LAB — POCT URINALYSIS DIPSTICK
Bilirubin, UA: NEGATIVE
Blood, UA: NEGATIVE
Glucose, UA: NEGATIVE
Ketones, UA: NEGATIVE
Leukocytes, UA: NEGATIVE
Nitrite, UA: NEGATIVE
Protein, UA: NEGATIVE
Spec Grav, UA: 1.01 (ref 1.010–1.025)
Urobilinogen, UA: 0.2 E.U./dL
pH, UA: 7.5 (ref 5.0–8.0)

## 2021-06-11 NOTE — Progress Notes (Signed)
Pt presents today to complete physical, Pt denies any issues or concerns at this time/CL,RMA 

## 2021-06-11 NOTE — Progress Notes (Signed)
? ?[facility] [place] ? ?____________________________________________ ? ? None  ?  (approximate) ? ?I have reviewed the triage vital signs and the nursing notes. ? ? ?HISTORY ? ?Chief Complaint ?Employment Physical ? ?HPI ?Allen Wilson is a 26 y.o. male patient presents for annual physical exam.  Patient voices no concerns or complaints.  Patient admits to noncompliance of medication for hyperlipidemia and hypertension.  Patient has a history of tachycardia and was prescribed propranolol.  Patient also was prescribed a statin for his hyperlipidemia. ?  ? ? ?Past Medical History:  ?Diagnosis Date  ? Tachycardia   ? ? ?Patient Active Problem List  ? Diagnosis Date Noted  ? Cellulitis of left forearm 02/11/2020  ? History of depression 02/11/2020  ? Sepsis ([place]) 02/11/2020  ? Cellulitis of left arm 02/11/2020  ? Severe major depression, single episode ([place]) 02/16/2018  ? Poisoning by anticholinergic drug, intentional self-harm, initial encounter ([place]) 02/15/2018  ? Tachycardia 02/15/2018  ? Acute psychosis ([place])   ? Drug overdose 09/17/2016  ? ? ?Past Surgical History:  ?Procedure Laterality Date  ? TYMPANOSTOMY TUBE PLACEMENT    ? childhood  ? ? ?Prior to Admission medications   ?Medication Sig Start Date End Date Taking? Authorizing Provider  ?acetaminophen (TYLENOL) 500 MG tablet Take 1,000 mg by mouth daily as needed for moderate pain.   Yes [provider]  ?ALPRAZolam (XANAX) 0.25 MG tablet Take 0.25 mg by mouth daily as needed. 05/15/21  Yes [provider]  ?citalopram (CELEXA) 20 MG tablet Take 20 mg by mouth daily. 05/15/21  Yes [provider]  ?cyclobenzaprine (FLEXERIL) 10 MG tablet Take 10 mg by mouth at bedtime. 12/31/18  Yes [provider]  ?risperiDONE (RISPERDAL) 2 MG tablet Take 2 mg by mouth at bedtime. 05/15/21  Yes [provider]  ? ? ?Allergies ?Patient has no known allergies. ? ?Family History  ?Problem Relation  Age of Onset  ? Other Mother   ?     alive & well.  ? Arthritis/Rheumatoid Mother   ? Hypertension Father   ? Heart attack Maternal Grandfather   ?     x 2 - first in his 101's.  ? ? ?Social History ?Social History  ? ?Tobacco Use  ? Smoking status: Never  ? Smokeless tobacco: Current  ?  Types: Chew  ?Vaping Use  ? Vaping Use: Never used  ?Substance Use Topics  ? Alcohol use: Yes  ?  Alcohol/week: 3.0 standard drinks  ?  Types: 3 Cans of beer per week  ? Drug use: No  ? ? ?Review of Systems ?Constitutional: No fever/chills ?Eyes: No visual changes. ?ENT: No sore throat. ?Cardiovascular: Denies chest pain.  Tachycardia ?Respiratory: Denies shortness of breath. ?Gastrointestinal: No abdominal pain.  No nausea, no vomiting.  No diarrhea.  No constipation. ?Genitourinary: Negative for dysuria. ?Musculoskeletal: Negative for back pain. ?Skin: Negative for rash. ?Neurological: Negative for headaches, focal weakness or numbness. ?Psychiatric: Anxiety and depression ?Endocrine: Hyperlipidemia and hypertension ?____________________________________________ ? ? ?PHYSICAL EXAM: ? ?VITAL SIGNS: ? ?Constitutional: Alert and oriented. Well appearing and in no acute distress. ?Eyes: Conjunctivae are normal. PERRL. EOMI. ?Head: Atraumatic. ?Nose: No congestion/rhinnorhea. ?Mouth/Throat: Mucous membranes are moist.  Oropharynx non-erythematous. ?Neck: No stridor.  No cervical spine tenderness to palpation. ?Hematological/Lymphatic/Immunilogical: No cervical lymphadenopathy. ?Cardiovascular: Normal rate, regular rhythm. Grossly normal heart sounds.  Good peripheral circulation. ?Respiratory: Normal respiratory effort.  No retractions. Lungs CTAB. ?Gastrointestinal: Soft and nontender. No distention. No abdominal bruits.  No CVA tenderness. ?Genitourinary: Deferred ?Musculoskeletal: No lower extremity tenderness nor edema.  No joint effusions. ?Neurologic:  Normal speech and language. No gross focal neurologic deficits are appreciated.  No gait instability. ?Skin:  Skin is warm, dry and intact. No rash noted. ?Psychiatric: Mood and affect are normal. Speech and behavior are normal. ? ?____________________________________________ ?  ?LABS ?_ ?           ?Component Ref Range & Units 6 d ago ?(06/05/21) 1 yr ago ?(06/11/20) 1 yr ago ?(02/12/20) 1 yr ago ?(02/11/20) 1 yr ago ?(02/11/20) 2 yr ago ?(05/23/19) 3 yr ago ?(05/28/18) 3 yr ago ?(05/28/18)  ?Glucose 70 - 99 mg/dL 82  88 R  90 CM  139 High  CM   98 R  103 High     ?Uric Acid 3.8 - 8.4 mg/dL 8.9 High   8.5 High  CM     9.2 High  CM     ?Comment:            Therapeutic target for gout patients: <6.0  ?BUN 6 - 20 mg/dL 14  15  22  High   21 High    15  13    ?Creatinine, Ser 0.76 - 1.27 mg/dL 1.02  1.09  0.83 R  1.14 R   1.20  1.22 R    ?eGFR >59 mL/min/1.73 105  97         ?BUN/Creatinine Ratio 9 - 20 14  14     13      ?Sodium 134 - 144 mmol/L 144  140  140 R  139 R   137  138 R    ?Potassium 3.5 - 5.2 mmol/L 4.4  4.3  3.7 R  3.8 R   3.9  3.4 Low  R    ?Chloride 96 - 106 mmol/L 103  98  110 R  104 R   101  103 R    ?Calcium 8.7 - 10.2 mg/dL 10.0  10.0  8.0 Low  R  9.4 R   9.8  9.1 R    ?Phosphorus 2.8 - 4.1 mg/dL 3.0  2.8     2.7 Low      ?Total Protein 6.0 - 8.5 g/dL 7.8  7.9  6.2 Low  R  8.9 High  R   7.8  8.0 R    ?Albumin 4.1 - 5.2 g/dL 4.9  4.9  2.9 Low  R  4.2 R   5.0  4.7 R    ?Globulin, Total 1.5 - 4.5 g/dL 2.9  3.0     2.8     ?Albumin/Globulin Ratio 1.2 - 2.2 1.7  1.6     1.8     ?Bilirubin Total 0.0 - 1.2 mg/dL 0.4  0.3  0.5 R  0.4 R   0.4  0.7 R    ?Alkaline Phosphatase 44 - 121 IU/L 73  80  52 R  68 R   78 R  73 R    ?LDH 121 - 224 IU/L 147  154     187     ?AST 0 - 40 IU/L 23  21  17  R  25 R   24  24 R    ?ALT 0 - 44 IU/L 48 High   41  32 R  46 High  R   36  32 R    ?GGT 0 - 65 IU/L 28  27     31      ?Iron 38 -  169 ug/dL 118  106     29 Low      ?Cholesterol, Total 100 - 199 mg/dL 194  221 High      190     ?Triglycerides 0 - 149 mg/dL 187 High   331 High      181 High      ?HDL >39 mg/dL  38 Low   36 Low      39 Low      ?VLDL Cholesterol Cal 5 - 40 mg/dL 33  59 High      32     ?LDL Chol Calc (NIH) 0 - 99 mg/dL 123 High   126 High      119 High      ?Chol/HDL Ratio 0.0 - 5.0 ratio 5.1 High   6.1 High  CM     4.9 CM     ?Comment:                                   T. Chol/HDL Ratio  ?                                            Men  Women  ?                              1/2 Avg.Risk  3.4    3.3  ?                                  Avg.Risk  5.0    4.4  ?                               2X Avg.Risk  9.6    7.1  ?                               3X Avg.Risk 23.4   11.0   ?Estimated CHD Risk 0.0 - 1.0 times avg. 1.1 High   1.3 High  CM     1.0 CM     ?Comment: The CHD Risk is based on the T. Chol/HDL ratio. Other  ?factors affect CHD Risk such as hypertension, smoking,  ?diabetes, severe obesity, and family history of  ?premature CHD.   ?TSH 0.450 - 4.500 uIU/mL 1.250  1.250     0.817     ?T4, Total 4.5 - 12.0 ug/dL 8.3  6.7     6.8     ?T3 Uptake Ratio 24 - 39 % 30  26     28      ?Free Thyroxine Index 1.2 - 4.9 2.5  1.7     1.9     ?WBC 3.4 - 10.8 x10E3/uL 7.0  6.4    17.6 High  R  15.1 High    6.1 R   ?RBC 4.14 - 5.80 x10E6/uL 6.13 High   5.84 High     5.38 R  5.74   5.74 R   ?Hemoglobin 13.0 - 17.7 g/dL 18.1 High   17.1    15.8 R  17.2  16.8 R   ?Hematocrit 37.5 - 51.0 % 52.5 High   50.7    47.1 R  50.1   49.1 R   ?MCV 79 - 97 fL 86  87    87.5 R  87   85.5 R   ?MCH 26.6 - 33.0 pg 29.5  29.3    29.4 R  30.0   29.3 R   ?MCHC 31.5 - 35.7 g/dL 34.5  33.7    33.5 R  34.3   34.2 R   ?RDW 11.6 - 15.4 % 12.8  12.4    11.9 R  12.0   11.6 R   ?Platelets 150 - 450 x10E3/uL 243  232    348 R  243   203 R   ?Neutrophils Not Estab. % 59  51    67 R  81     ?Lymphs Not Estab. % 33  38     10     ?Monocytes Not Estab. % 7  8     7      ?Eos Not Estab. % 0  1     0     ?Basos Not Estab. % 1  1     1      ?Neutrophils Absolute 1.4 - 7.0 x10E3/uL 4.2  3.4    12.0 High  R  12.4 High      ?Lymphocytes Absolute 0.7 - 3.1  x10E3/uL 2.3  2.4    4.5 High  R  1.5     ?Monocytes Absolute 0.1 - 0.9 x10E3/uL 0.5  0.5     1.1 High      ?EOS (ABSOLUTE) 0.0 - 0.4 x10E3/uL 0.0  0.0     0.0     ?Basophils Absolute 0.0 - 0.2 x10E3/uL 0.1

## 2021-06-11 NOTE — Addendum Note (Signed)
Addended by: Christianne Dolin F on: 06/11/2021 11:28 AM ? ? Modules accepted: Orders ? ?

## 2021-07-08 DIAGNOSIS — F410 Panic disorder [episodic paroxysmal anxiety] without agoraphobia: Secondary | ICD-10-CM | POA: Diagnosis not present

## 2021-07-08 DIAGNOSIS — R69 Illness, unspecified: Secondary | ICD-10-CM | POA: Diagnosis not present

## 2021-08-07 ENCOUNTER — Encounter: Payer: Self-pay | Admitting: Emergency Medicine

## 2021-08-07 ENCOUNTER — Other Ambulatory Visit: Payer: Self-pay

## 2021-08-07 ENCOUNTER — Observation Stay
Admission: EM | Admit: 2021-08-07 | Discharge: 2021-08-08 | Disposition: A | Discharge status: Home / Self Care | Payer: 59 | Attending: Internal Medicine | Admitting: Internal Medicine

## 2021-08-07 DIAGNOSIS — R059 Cough, unspecified: Secondary | ICD-10-CM | POA: Diagnosis not present

## 2021-08-07 DIAGNOSIS — R0789 Other chest pain: Secondary | ICD-10-CM | POA: Insufficient documentation

## 2021-08-07 DIAGNOSIS — R000 Tachycardia, unspecified: Secondary | ICD-10-CM | POA: Diagnosis present

## 2021-08-07 DIAGNOSIS — Z720 Tobacco use: Secondary | ICD-10-CM

## 2021-08-07 DIAGNOSIS — Z79899 Other long term (current) drug therapy: Secondary | ICD-10-CM

## 2021-08-07 DIAGNOSIS — I4891 Unspecified atrial fibrillation: Secondary | ICD-10-CM | POA: Diagnosis not present

## 2021-08-07 DIAGNOSIS — I959 Hypotension, unspecified: Secondary | ICD-10-CM | POA: Diagnosis present

## 2021-08-07 DIAGNOSIS — Z8659 Personal history of other mental and behavioral disorders: Secondary | ICD-10-CM

## 2021-08-07 DIAGNOSIS — F32A Depression, unspecified: Secondary | ICD-10-CM | POA: Diagnosis present

## 2021-08-07 NOTE — ED Triage Notes (Signed)
Patient ambulatory to triage with steady gait, without difficulty or distress noted; pt reports sensation of heart racing since 10pm; denies pain, denies accomp symptoms; denies hx of same

## 2021-08-08 ENCOUNTER — Inpatient Hospital Stay (HOSPITAL_COMMUNITY)
Admit: 2021-08-08 | Discharge: 2021-08-08 | Disposition: A | Discharge status: Home / Self Care | Payer: 59 | Attending: Internal Medicine | Admitting: Internal Medicine

## 2021-08-08 ENCOUNTER — Encounter: Payer: Self-pay | Admitting: Physician Assistant

## 2021-08-08 ENCOUNTER — Ambulatory Visit: Payer: 59 | Admitting: Physician Assistant

## 2021-08-08 VITALS — BP 128/83 | HR 103 | Temp 99.1°F | Resp 16

## 2021-08-08 DIAGNOSIS — I480 Paroxysmal atrial fibrillation: Secondary | ICD-10-CM

## 2021-08-08 DIAGNOSIS — I4891 Unspecified atrial fibrillation: Secondary | ICD-10-CM

## 2021-08-08 DIAGNOSIS — Z79899 Other long term (current) drug therapy: Secondary | ICD-10-CM | POA: Diagnosis not present

## 2021-08-08 DIAGNOSIS — I959 Hypotension, unspecified: Secondary | ICD-10-CM | POA: Diagnosis present

## 2021-08-08 DIAGNOSIS — R000 Tachycardia, unspecified: Secondary | ICD-10-CM

## 2021-08-08 DIAGNOSIS — F32A Depression, unspecified: Secondary | ICD-10-CM | POA: Diagnosis present

## 2021-08-08 DIAGNOSIS — Z720 Tobacco use: Secondary | ICD-10-CM | POA: Diagnosis not present

## 2021-08-08 LAB — ECHOCARDIOGRAM COMPLETE
AR max vel: 2.93 cm2
AV Area VTI: 3.28 cm2
AV Area mean vel: 2.75 cm2
AV Mean grad: 2 mmHg
AV Peak grad: 3.4 mmHg
Ao pk vel: 0.92 m/s
Area-P 1/2: 3.28 cm2
Height: 73 in
MV VTI: 2.49 cm2
S' Lateral: 2.8 cm
Weight: 3840 oz

## 2021-08-08 LAB — PROTIME-INR
INR: 0.9 (ref 0.8–1.2)
Prothrombin Time: 12.4 seconds (ref 11.4–15.2)

## 2021-08-08 LAB — URINE DRUG SCREEN, QUALITATIVE (ARMC ONLY)
Amphetamines, Ur Screen: NOT DETECTED
Barbiturates, Ur Screen: NOT DETECTED
Benzodiazepine, Ur Scrn: NOT DETECTED
Cannabinoid 50 Ng, Ur ~~LOC~~: NOT DETECTED
Cocaine Metabolite,Ur ~~LOC~~: NOT DETECTED
MDMA (Ecstasy)Ur Screen: NOT DETECTED
Methadone Scn, Ur: NOT DETECTED
Opiate, Ur Screen: NOT DETECTED
Phencyclidine (PCP) Ur S: NOT DETECTED
Tricyclic, Ur Screen: NOT DETECTED

## 2021-08-08 LAB — TROPONIN I (HIGH SENSITIVITY)
Troponin I (High Sensitivity): 3 ng/L (ref <18)
Troponin I (High Sensitivity): 4 ng/L (ref <18)

## 2021-08-08 LAB — CBC
HCT: 52.3 % — ABNORMAL HIGH (ref 39.0–52.0)
Hemoglobin: 17.2 g/dL — ABNORMAL HIGH (ref 13.0–17.0)
MCH: 28.6 pg (ref 26.0–34.0)
MCHC: 32.9 g/dL (ref 30.0–36.0)
MCV: 87 fL (ref 80.0–100.0)
Platelets: 276 10*3/uL (ref 150–400)
RBC: 6.01 MIL/uL — ABNORMAL HIGH (ref 4.22–5.81)
RDW: 12 % (ref 11.5–15.5)
WBC: 11.3 10*3/uL — ABNORMAL HIGH (ref 4.0–10.5)
nRBC: 0 % (ref 0.0–0.2)

## 2021-08-08 LAB — CK: Total CK: 209 U/L (ref 49–397)

## 2021-08-08 LAB — APTT: aPTT: 28 seconds (ref 24–36)

## 2021-08-08 LAB — BASIC METABOLIC PANEL
Anion gap: 8 (ref 5–15)
BUN: 17 mg/dL (ref 6–20)
CO2: 26 mmol/L (ref 22–32)
Calcium: 9.9 mg/dL (ref 8.9–10.3)
Chloride: 105 mmol/L (ref 98–111)
Creatinine, Ser: 1.1 mg/dL (ref 0.61–1.24)
GFR, Estimated: >60 mL/min (ref >60)
Glucose, Bld: 112 mg/dL — ABNORMAL HIGH (ref 70–99)
Potassium: 3.7 mmol/L (ref 3.5–5.1)
Sodium: 139 mmol/L (ref 135–145)

## 2021-08-08 LAB — MAGNESIUM: Magnesium: 2.2 mg/dL (ref 1.7–2.4)

## 2021-08-08 LAB — HEPARIN LEVEL (UNFRACTIONATED): Heparin Unfractionated: 0.35 IU/mL (ref 0.30–0.70)

## 2021-08-08 LAB — TSH: TSH: 2.061 u[IU]/mL (ref 0.350–4.500)

## 2021-08-08 MED ORDER — ALPRAZOLAM 0.5 MG PO TABS
0.2500 mg | ORAL_TABLET | Freq: Every day | ORAL | Status: DC | PRN
Start: 2021-08-08 — End: 2021-08-08

## 2021-08-08 MED ORDER — ONDANSETRON HCL 4 MG/2ML IJ SOLN: 4.0000 mg | Freq: Four times a day (QID) | INTRAMUSCULAR | Status: DC | PRN

## 2021-08-08 MED ORDER — DILTIAZEM HCL ER COATED BEADS 240 MG PO CP24
240.0000 mg | ORAL_CAPSULE | Freq: Every day | ORAL | Status: DC
  Administered 2021-08-08: 240 mg via ORAL
  Filled 2021-08-08: qty 1

## 2021-08-08 MED ORDER — HEPARIN (PORCINE) 25000 UT/250ML-% IV SOLN
1600.0000 [IU]/h | INTRAVENOUS | Status: DC
  Administered 2021-08-08: 1600 [IU]/h via INTRAVENOUS
  Filled 2021-08-08: qty 250

## 2021-08-08 MED ORDER — DILTIAZEM HCL 25 MG/5ML IV SOLN
15.0000 mg | INTRAVENOUS | Status: AC
  Administered 2021-08-08: 15 mg via INTRAVENOUS
  Filled 2021-08-08: qty 5

## 2021-08-08 MED ORDER — LACTATED RINGERS IV BOLUS
1000.0000 mL | Freq: Once | INTRAVENOUS | Status: AC
  Administered 2021-08-08: 1000 mL via INTRAVENOUS

## 2021-08-08 MED ORDER — CITALOPRAM HYDROBROMIDE 20 MG PO TABS
20.0000 mg | ORAL_TABLET | Freq: Every day | ORAL | Status: DC
  Filled 2021-08-08: qty 1

## 2021-08-08 MED ORDER — ADENOSINE 12 MG/4ML IV SOLN
INTRAVENOUS | Status: AC
  Administered 2021-08-08: 12 mg via INTRAVENOUS
  Filled 2021-08-08: qty 4

## 2021-08-08 MED ORDER — ADENOSINE 12 MG/4ML IV SOLN: 12.0000 mg | Freq: Once | INTRAVENOUS | Status: AC

## 2021-08-08 MED ORDER — DILTIAZEM HCL-DEXTROSE 125-5 MG/125ML-% IV SOLN (PREMIX)
5.0000 mg/h | INTRAVENOUS | Status: DC
  Administered 2021-08-08: 15 mg/h via INTRAVENOUS
  Filled 2021-08-08: qty 125

## 2021-08-08 MED ORDER — RISPERIDONE 1 MG PO TABS: 3.0000 mg | ORAL_TABLET | Freq: Every day | ORAL | Status: DC

## 2021-08-08 MED ORDER — ADENOSINE 12 MG/4ML IV SOLN
INTRAVENOUS | Status: AC
  Filled 2021-08-08: qty 4

## 2021-08-08 MED ORDER — HEPARIN BOLUS VIA INFUSION
4000.0000 [IU] | Freq: Once | INTRAVENOUS | Status: AC
  Administered 2021-08-08: 4000 [IU] via INTRAVENOUS
  Filled 2021-08-08: qty 4000

## 2021-08-08 MED ORDER — ACETAMINOPHEN 325 MG PO TABS: 650.0000 mg | ORAL_TABLET | ORAL | Status: DC | PRN

## 2021-08-08 MED ORDER — ADENOSINE 12 MG/4ML IV SOLN
12.0000 mg | Freq: Once | INTRAVENOUS | Status: AC
  Administered 2021-08-08: 12 mg via INTRAVENOUS

## 2021-08-08 MED ORDER — ADENOSINE 6 MG/2ML IV SOLN
6.0000 mg | Freq: Once | INTRAVENOUS | Status: AC
  Administered 2021-08-08: 6 mg via INTRAVENOUS
  Filled 2021-08-08: qty 2

## 2021-08-08 MED ORDER — DILTIAZEM HCL ER COATED BEADS 240 MG PO CP24: 240.0000 mg | ORAL_CAPSULE | Freq: Every day | ORAL | 0 refills | Status: DC

## 2021-08-08 NOTE — Assessment & Plan Note (Addendum)
Continue home Celexa, alprazolam and risperidone

## 2021-08-08 NOTE — Progress Notes (Signed)
ANTICOAGULATION CONSULT NOTE - Initial Consult  Pharmacy Consult for Heparin  Indication: atrial fibrillation  No Known Allergies  Patient Measurements: Height: 6\' 1"  (185.4 cm) Weight: 108.9 kg (240 lb) IBW/kg (Calculated) : 79.9 Heparin Dosing Weight: 102.6 kg   Vital Signs: Temp: 98.5 F (36.9 C) (06/22 0008) Temp Source: Oral (06/22 0008) BP: 132/65 (06/22 0145) Pulse Rate: 151 (06/22 0145)  Labs: Recent Labs    08/08/21 0014  HGB 17.2*  HCT 52.3*  PLT 276  CREATININE 1.10  TROPONINIHS 3    Estimated Creatinine Clearance: 132.9 mL/min (by C-G formula based on SCr of 1.1 mg/dL).   Medical History: Past Medical History:  Diagnosis Date   Tachycardia     Medications:  (Not in a hospital admission)   Assessment: Pharmacy consulted to dose heparin in this 26 year old male admitted with Afib.  No prior anticoag noted. CrCl = 132.9 ml/min  Goal of Therapy:  Heparin level 0.3-0.7 units/ml Monitor platelets by anticoagulation protocol: Yes   Plan:  Give 4000 units bolus x 1 Start heparin drip @ 1600 units/hr.  Will check HL 6 hrs after start of heparin. Will check CBC daily.   Valecia Beske D 08/08/2021,1:51 AM

## 2021-08-08 NOTE — ED Notes (Signed)
Dr. Lyn Hollingshead at bedside, states pt. Ready for discharge. IV d/c, cardiac monitoring discontinued. Pt. Will await dc papers.

## 2021-08-08 NOTE — Progress Notes (Signed)
ANTICOAGULATION CONSULT NOTE - Initial Consult  Pharmacy Consult for Heparin  Indication: atrial fibrillation  No Known Allergies  Patient Measurements: Height: 6\' 1"  (185.4 cm) Weight: 108.9 kg (240 lb) IBW/kg (Calculated) : 79.9 Heparin Dosing Weight: 102.6 kg   Vital Signs: Temp: 98.5 F (36.9 C) (06/22 0008) Temp Source: Oral (06/22 0008) BP: 117/82 (06/22 0700) Pulse Rate: 101 (06/22 0700)  Labs: Recent Labs    08/08/21 0014 08/08/21 0139 08/08/21 0655  HGB 17.2*  --   --   HCT 52.3*  --   --   PLT 276  --   --   APTT 28  --   --   LABPROT 12.4  --   --   INR 0.9  --   --   HEPARINUNFRC  --   --  0.35  CREATININE 1.10  --   --   CKTOTAL  --  209  --   TROPONINIHS 3 4  --      Estimated Creatinine Clearance: 132.9 mL/min (by C-G formula based on SCr of 1.1 mg/dL).   Medical History: Past Medical History:  Diagnosis Date   Tachycardia     Medications:  (Not in a hospital admission)  Assessment: Pharmacy consulted to dose heparin in this 26 year old male admitted with Afib.  No prior anticoag noted. CrCl = 132.9 ml/min  Goal of Therapy:  Heparin level 0.3-0.7 units/ml Monitor platelets by anticoagulation protocol: Yes   Results: Date/time aPTT/HL Comments 06/22@0655  HL 0.35 Therapeutic x 1  Plan:  Start heparin drip @ 1600 units/hr.  Will re-check HL in 6 hrs to confirm rate Check CBC daily while on heparin  Chay Mazzoni Rodriguez-Guzman PharmD, BCPS 08/08/2021 7:58 AM

## 2021-08-08 NOTE — Progress Notes (Signed)
*  PRELIMINARY RESULTS* Echocardiogram 2D Echocardiogram has been performed.  Cristela Blue 08/08/2021, 9:55 AM

## 2021-08-08 NOTE — Progress Notes (Signed)
Called requesting appt to clear him to return to work on Monday (08/12/2021).  AMD

## 2021-08-08 NOTE — ED Notes (Signed)
Patient ambulatory to bathroom in hall. Patient refused to use bathroom in room. Patient explained risks of ambulating to hall bathroom.

## 2021-08-08 NOTE — Consult Note (Signed)
Cardiology Consultation:   Patient ID: Allen Wilson MRN: [id number]; DOB: 02-06-1996  Admit date: 08/07/2021 Date of Consult: 08/08/2021  PCP:  Janne Napoleon, NP   [facility] Providers Cardiologist:  New  Patient Profile:   Allen Wilson is a 26 y.o. male with a hx of depression who is being seen 08/08/2021 for the evaluation of new onset afib at the request of Dr. Lyn Hollingshead.  History of Present Illness:   Mr. Aikey has been seen by cardiology back in 2018 for tachyarrhythmia, found to have sinus tachycardia in the setting of benadryl overdose. Echo showed LVEF 60-65%. We did not see him in follow-up.   The patient presented to the ER 6/22 for tachycardia. Last night he laid down and felt his hear racing. He placed his Apple watch on and noted his heart rate ws jumping from the 70-200s. He is a IT sales professional, so he went into the statin where a 12 lead was performed and he ultimately went to the ER/ He denied chest pain or SIB. No nasuea, vomiting, LLE, orthopnea, or pnd. He reported a cold for last few days on nasal decongestants and sudafed. He drinks alcohol 3-4 beers weekly. No drug or tobacco use.   In the ER heart rate was in the 150s with RR 23. EKG showed Afib RVR. He was given adenosine x3 with no improvement. He was started on IV dilt bolus with infusion, with improvement of heart rates. Labs showed WBC 11,000, Hgb 17, sodium 139, K 3.7, Scr 1.10, BUN 17. HS trop negative. He was admitted for further work-up.   Overnight he converted to NSR.   Past Medical History:  Diagnosis Date   Tachycardia     Past Surgical History:  Procedure Laterality Date   TYMPANOSTOMY TUBE PLACEMENT     childhood     Home Medications:  Prior to Admission medications   Medication Sig Start Date End Date Taking? Authorizing Provider  acetaminophen (TYLENOL) 500 MG tablet Take 1,000 mg by mouth daily as needed for moderate pain.   Yes [provider]   ALPRAZolam (XANAX) 0.25 MG tablet Take 0.25 mg by mouth daily as needed. 05/15/21  Yes [provider]  citalopram (CELEXA) 20 MG tablet Take 20 mg by mouth daily. 05/15/21  Yes [provider]  risperiDONE (RISPERDAL) 3 MG tablet Take 3 mg by mouth at bedtime. 07/08/21  Yes [provider]    Inpatient Medications: Scheduled Meds:  citalopram  20 mg Oral Daily   risperiDONE  3 mg Oral QHS   Continuous Infusions:  diltiazem (CARDIZEM) infusion 15 mg/hr (08/08/21 0146)   heparin 1,600 Units/hr (08/08/21 0154)   PRN Meds: acetaminophen, ALPRAZolam, ondansetron (ZOFRAN) IV  Allergies:   No Known Allergies  Social History:   Social History   Socioeconomic History   Marital status: Single    Spouse name: Not on file   Number of children: Not on file   Years of education: Not on file   Highest education level: Not on file  Occupational History   Occupation: EMT  Tobacco Use   Smoking status: Never   Smokeless tobacco: Current    Types: Chew  Vaping Use   Vaping Use: Never used  Substance and Sexual Activity   Alcohol use: Yes    Alcohol/week: 3.0 standard drinks of alcohol    Types: 3 Cans of beer per week   Drug use: No   Sexual activity: Yes  Other Topics Concern   Not  on file  Social History Narrative   Works full time as Museum/gallery exhibitions officer.  Lives with mother.  Not currently routinely exercising but used to run cross country and play basketball in [employer].   Social Determinants of Health   Financial Resource Strain: Not on file  Food Insecurity: Not on file  Transportation Needs: Not on file  Physical Activity: Not on file  Stress: Not on file  Social Connections: Not on file  Intimate Partner Violence: Not on file    Family History:    Family History  Problem Relation Age of Onset   Other Mother        alive & well.   Arthritis/Rheumatoid Mother    Hypertension Father    Heart attack Maternal Grandfather        x 2 - first in his 47's.      ROS:  Please see the history of present illness.   All other ROS reviewed and negative.     Physical Exam/Data:   Vitals:   08/08/21 0415 08/08/21 0430 08/08/21 0600 08/08/21 0700  BP: 129/89 124/73 128/86 117/82  Pulse: (!) 125 (!) 119 (!) 102 (!) 101  Resp: 19 18 18 18   Temp:      TempSrc:      SpO2: 97% 96% 97% 97%  Weight:      Height:       No intake or output data in the 24 hours ending 08/08/21 0812    08/07/2021   11:58 PM 06/11/2021    9:48 AM 06/28/2020   10:18 AM  Last 3 Weights  Weight (lbs) 240 lb 232 lb 252 lb  Weight (kg) 108.863 kg 105.235 kg 114.306 kg     Body mass index is 31.66 kg/m.  General:  Well nourished, well developed, in no acute distress HEENT: normal Neck: no JVD Vascular: No carotid bruits; Distal pulses 2+ bilaterally Cardiac:  normal S1, S2; RRR; no murmur  Lungs:  clear to auscultation bilaterally, no wheezing, rhonchi or rales  Abd: soft, nontender, no hepatomegaly  Ext: no edema Musculoskeletal:  No deformities, BUE and BLE strength normal and equal Skin: warm and dry  Neuro:  CNs 2-12 intact, no focal abnormalities noted Psych:  Normal affect   EKG:  The EKG was personally reviewed and demonstrates:  Afib 152bpm, nonspecific T wave changes Telemetry:  Telemetry was personally reviewed and demonstrates:  Afib>NSR HR 90s, PVs  Relevant CV Studies:  Echo 2018 Study Conclusions   - Left ventricle: The cavity size was normal. Systolic function was    normal. The estimated ejection fraction was in the range of 60%    to 65%. Wall motion was normal; there were no regional wall    motion abnormalities. Left ventricular diastolic function    parameters were normal.  - Left atrium: The atrium was normal in size.  - Right ventricle: Systolic function was normal.  - Pulmonary arteries: Systolic pressure was within the normal    range  Laboratory Data:  High Sensitivity Troponin:   Recent Labs  Lab 08/08/21 0014  08/08/21 0139  TROPONINIHS 3 4     Chemistry Recent Labs  Lab 08/08/21 0014 08/08/21 0138  NA 139  --   K 3.7  --   CL 105  --   CO2 26  --   GLUCOSE 112*  --   BUN 17  --   CREATININE 1.10  --   CALCIUM 9.9  --   MG  --  2.2  GFRNONAA >60  --   ANIONGAP 8  --     No results for input(s): "PROT", "ALBUMIN", "AST", "ALT", "ALKPHOS", "BILITOT" in the last 168 hours. Lipids No results for input(s): "CHOL", "TRIG", "HDL", "LABVLDL", "LDLCALC", "CHOLHDL" in the last 168 hours.  Hematology Recent Labs  Lab 08/08/21 0014  WBC 11.3*  RBC 6.01*  HGB 17.2*  HCT 52.3*  MCV 87.0  MCH 28.6  MCHC 32.9  RDW 12.0  PLT 276   Thyroid No results for input(s): "TSH", "FREET4" in the last 168 hours.  BNPNo results for input(s): "BNP", "PROBNP" in the last 168 hours.  DDimer No results for input(s): "DDIMER" in the last 168 hours.   Radiology/Studies:  No results found.   Assessment and Plan:   New onset Afib - presented with palpitations found to be in SVT s/p adenosine x3. Found to have new onset afib/flutter started on IV dilt - inciting event may be multiple days of sudafed.  - now in NSR With heart rates in the 90s - he is feeling back to normal - CHADSVASC of 0. He does not qualify for longterm a/c - echo ordered - TSH pending - keep K>4 and Mag >2 - may need sleep study as OP - change IV dilt to oral   For questions or updates, please contact [facility] Please consult [email or web address] for contact info under    Signed, Raekwan Spelman David Stall, PA-C  08/08/2021 8:12 AM

## 2021-08-08 NOTE — Assessment & Plan Note (Addendum)
Rapid atrial fibrillation Patient presents with narrow complex tachyarrhythmia that did not respond to adenosine no diltiazem bolus but diltiazem infusion converted to sinus rhythm.    Cardiology recommended continue p.o. diltiazem, okay to defer anticoagulation given lone A-fib and CHA2DS2-VASc 0.    Outpatient follow-up to include sleep study evaluation.

## 2021-08-08 NOTE — ED Notes (Signed)
Assumed care of pt at this time.  Pt given warm blanket at this time and lights dimmed for comfort.  Pt denies any other needs currently.

## 2021-08-09 LAB — HIV ANTIBODY (ROUTINE TESTING W REFLEX): HIV Screen 4th Generation wRfx: NONREACTIVE

## 2021-08-12 ENCOUNTER — Ambulatory Visit (HOSPITAL_COMMUNITY)
Admission: RE | Admit: 2021-08-12 | Discharge: 2021-08-12 | Disposition: A | Discharge status: Home / Self Care | Payer: 59 | Source: Ambulatory Visit | Attending: Physician Assistant | Admitting: Physician Assistant

## 2021-08-12 VITALS — BP 132/96 | HR 81 | Ht 73.0 in | Wt 248.4 lb

## 2021-08-12 DIAGNOSIS — R400 Somnolence: Secondary | ICD-10-CM | POA: Insufficient documentation

## 2021-08-12 DIAGNOSIS — Z6832 Body mass index (BMI) 32.0-32.9, adult: Secondary | ICD-10-CM | POA: Insufficient documentation

## 2021-08-12 DIAGNOSIS — E669 Obesity, unspecified: Secondary | ICD-10-CM | POA: Diagnosis not present

## 2021-08-12 DIAGNOSIS — R0683 Snoring: Secondary | ICD-10-CM | POA: Insufficient documentation

## 2021-08-12 DIAGNOSIS — I480 Paroxysmal atrial fibrillation: Secondary | ICD-10-CM | POA: Insufficient documentation

## 2021-08-12 DIAGNOSIS — Z7182 Exercise counseling: Secondary | ICD-10-CM | POA: Insufficient documentation

## 2021-08-12 MED ORDER — DILTIAZEM HCL ER COATED BEADS 240 MG PO CP24: 240.0000 mg | ORAL_CAPSULE | Freq: Every day | ORAL | 3 refills | Status: DC

## 2021-09-02 ENCOUNTER — Telehealth: Payer: Self-pay | Admitting: *Deleted

## 2021-09-02 DIAGNOSIS — R000 Tachycardia, unspecified: Secondary | ICD-10-CM

## 2021-09-02 DIAGNOSIS — I480 Paroxysmal atrial fibrillation: Secondary | ICD-10-CM

## 2021-09-02 NOTE — Telephone Encounter (Signed)
Prior Authorization for split night sent to [employer] via web portal. Tracking Number . 7/17 DENIED-NO CO-MORBIDITIES- HOME SLEEP TEST APPROVED 7/3  REQUEST UPLOADED IN [employer]

## 2021-10-15 NOTE — Progress Notes (Unsigned)
Electrophysiology Office Note:    Date:  10/16/2021   ID:  Allen Wilson, Allen Wilson 1995/06/14, MRN [id number]  PCP:  Allen Napoleon, NP  [facility] Cardiologist:  None  [facility] Electrophysiologist:  Allen Prude, MD   Referring MD: Allen Goltz, PA   Chief Complaint: Atrial fibrillation  History of Present Illness:    Allen Wilson is a 26 y.o. male who presents for an evaluation of atrial fibrillation at the request of Allen Loa, PA-C. Their medical history includes palpitations.  The patient was recently hospitalized on August 07, 2021 for new onset atrial fibrillation.  His ventricular rates were into the 180s systolic.  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  He requested when the paramedics come evaluate him.  They put him on the heart monitor and found him in a "SVT" at 226 bpm.  They tried vagal maneuvers but these were unsuccessful.  He ultimately went to the hospital where adenosine was tried 3 times.  Ultimately the adenosine slowed the rhythm and he transition to atrial fibrillation.  He was started on diltiazem.  He has tolerated the diltiazem at home without any off target effects.  He tells me since starting diltiazem he has had short episodes of palpitations.     Past Medical History:  Diagnosis Date   Tachycardia     Past Surgical History:  Procedure Laterality Date   TYMPANOSTOMY TUBE PLACEMENT     childhood    Current Medications: Current Meds  Medication Sig   ALPRAZolam (XANAX) 0.25 MG tablet Take 0.25 mg by mouth daily as needed.   citalopram (CELEXA) 20 MG tablet Take 20 mg by mouth daily.   diltiazem (CARDIZEM CD) 240 MG 24 hr capsule Take 1 capsule (240 mg total) by mouth daily.   risperiDONE (RISPERDAL) 3 MG tablet Take 3 mg by mouth at bedtime.     Allergies:   Patient has no known allergies.   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  Not currently routinely exercising but used to run cross country and play basketball in [employer].   Social Determinants of Health   Financial Resource Strain: Not on file  Food Insecurity: Not on file  Transportation Needs: Not on file  Physical Activity: Not on file  Stress: Not on file  Social Connections: Not on file     Family History: The patient's family history includes Arthritis/Rheumatoid in his mother; Heart attack in his maternal grandfather; Hypertension in his father; Other in his mother.  ROS:   Please see the history of present illness.    All other systems reviewed and are negative.  EKGs/Labs/Other  Studies Reviewed:    The following studies were reviewed today:  August 08, 2021 echo EF 60% RV normal No MR No AI  EKG on August 08, 2021 shows atrial fibrillation with a ventricular rate of 152 bpm     Recent Labs: 06/05/2021: ALT 48 08/08/2021: BUN 17; Creatinine, Ser 1.10; Hemoglobin 17.2; Magnesium 2.2; Platelets 276; Potassium 3.7; Sodium 139; TSH 2.061  Recent Lipid Panel    Component Value Date/Time   CHOL 194 06/05/2021 0928   TRIG 187 (H) 06/05/2021 0928   HDL 38 (L) 06/05/2021 0928   CHOLHDL 5.1 (H) 06/05/2021 0928   LDLCALC 123 (H) 06/05/2021 0928    Physical Exam:    VS:  BP 110/72   Pulse 62   Ht 6\' 1"  (1.854 m)   Wt 251 lb 3.2 oz (113.9 kg)   SpO2 99%   BMI 33.14 kg/m      Wt Readings from Last 3 Encounters:  10/16/21 251 lb 3.2 oz (113.9 kg)  08/12/21 248 lb 6.4 oz (112.7 kg)  08/07/21 240 lb (108.9 kg)     GEN:  Well nourished, well developed in no acute distress.  Obese HEENT: Normal NECK: No JVD; No carotid bruits LYMPHATICS: No lymphadenopathy CARDIAC: RRR, no murmurs, rubs, gallops RESPIRATORY:  Clear to auscultation without rales, wheezing or rhonchi  ABDOMEN: Soft, non-tender, non-distended MUSCULOSKELETAL:  No edema; No deformity  SKIN: Warm and dry NEUROLOGIC:  Alert and oriented x 3 PSYCHIATRIC:  Normal affect       ASSESSMENT:    1. Paroxysmal atrial fibrillation (HCC)   2. Obesity due to excess calories, unspecified classification, unspecified whether serious comorbidity present    PLAN:    In order of problems listed above:  #Atrial fibrillation Recent diagnosis in the emergency department.  Symptomatic.  I suspect his A-fib episode was triggered by SVT treated with adenosine.  Given the rate in his age, suspect AVNRT although I cannot completely exclude other mechanisms of SVT.  I discussed treatment options with the patient including conservative/watchful waiting, treatment with nodal blockers as he is already prescribed and EP study with ablation.  We will continue with diltiazem for now.  I will order him a 2-week ZIO monitor to assess his residual palpitations.  I will have him see one of the APP's in 3 months.  If he were to have breakthrough episodes of SVT while on diltiazem, favor EP study and ablation for more definitive management.  #Obesity Likely contributing to the above.   #Snoring Suspect OSA.  Has a sleep study scheduled.     Medication Adjustments/Labs and Tests Ordered: Current medicines are reviewed at length with the patient today.  Concerns regarding medicines are outlined above.  No orders of the defined types were placed in this encounter.  No orders of the defined types were placed in this  encounter.    Signed, 08/09/21. Rossie Muskrat, MD, [facility], [facility] 10/16/2021 9:26 AM    Electrophysiology [facility]

## 2021-10-16 ENCOUNTER — Ambulatory Visit: Payer: 59 | Attending: Cardiology | Admitting: Cardiology

## 2021-10-16 ENCOUNTER — Encounter: Payer: Self-pay | Admitting: Cardiology

## 2021-10-16 ENCOUNTER — Ambulatory Visit (INDEPENDENT_AMBULATORY_CARE_PROVIDER_SITE_OTHER): Payer: 59

## 2021-10-16 VITALS — BP 110/72 | HR 62 | Ht 73.0 in | Wt 251.2 lb

## 2021-10-16 DIAGNOSIS — E6609 Other obesity due to excess calories: Secondary | ICD-10-CM | POA: Diagnosis not present

## 2021-10-16 DIAGNOSIS — I480 Paroxysmal atrial fibrillation: Secondary | ICD-10-CM | POA: Diagnosis not present

## 2021-10-16 NOTE — Addendum Note (Signed)
Addended by: Sampson Goon on: 10/16/2021 09:46 AM   Modules accepted: Orders

## 2021-10-16 NOTE — Patient Instructions (Signed)
Medication Instructions:  none *If you need a refill on your cardiac medications before your next appointment, please call your pharmacy*   Lab Work: none If you have labs (blood work) drawn today and your tests are completely normal, you will receive your results only by: [place] (if you have MyChart) OR A paper copy in the mail If you have any lab test that is abnormal or we need to change your treatment, we will call you to review the results.   Testing/Procedures: Your physician has recommended that you wear a heart monitor. Heart monitors are medical devices that record the heart's electrical activity. Doctors most often use these monitors to diagnose arrhythmias. Arrhythmias are problems with the speed or rhythm of the heartbeat. The monitor is a small, portable device. You can wear one while you do your normal daily activities. This is usually used to diagnose what is causing palpitations/syncope (passing out).    Follow-Up: At [facility], you and your health needs are our priority.  As part of our continuing mission to provide you with exceptional heart care, we have created designated Provider Care Teams.  These Care Teams include your primary Cardiologist (physician) and Advanced Practice Providers (APPs -  Physician Assistants and Nurse Practitioners) who all work together to provide you with the care you need, when you need it.  We recommend signing up for the patient portal called "MyChart".  Sign up information is provided on this After Visit Summary.  MyChart is used to connect with patients for Virtual Visits (Telemedicine).  Patients are able to view lab/test results, encounter notes, upcoming appointments, etc.  Non-urgent messages can be sent to your provider as well.   To learn more about what you can do with MyChart, go to [email or web address].    Your next appointment:   3 month(s)  The format for your next appointment:   In Person  Provider:    You will see one of the following Advanced Practice Providers on your designated Care Team:   Murray Hodgkins, NP Christell Faith, PA-C Cadence Kathlen Mody, PA-C Gerrie Nordmann, NP      Other Instructions ZIO AT Long term monitor-Live Telemetry  Your physician has requested you wear a ZIO patch monitor for 14 days.  This is a single patch monitor. [employer] supplies one patch monitor per enrollment. Additional  stickers are not available.  Please do not apply patch if you will be having a Nuclear Stress Test, Echocardiogram, Cardiac CT, MRI,  or Chest Xray during the period you would be wearing the monitor. The patch cannot be worn during  these tests. You cannot remove and re-apply the ZIO AT patch monitor.  Your ZIO patch monitor will be mailed 3 day USPS to your address on file. It may take 3-5 days to  receive your monitor after you have been enrolled.  Once you have received your monitor, please review the enclosed instructions. Your monitor has  already been registered assigning a specific monitor serial # to you.   Billing and Patient Assistance Program information  Allen Wilson has been supplied with any insurance information on record for billing. [employer] offers a sliding scale Patient Assistance Program for patients without insurance, or whose  insurance does not completely cover the cost of the ZIO patch monitor. You must apply for the  Patient Assistance Program to qualify for the discounted rate. To apply, call [employer] at [phone],  select option 4, select option 2 , ask to apply for the Patient Assistance  Program, (you can request an  interpreter if needed). [employer] will ask your household income and how many people are in your  household. [employer] will quote your out-of-pocket cost based on this information. They will also be able  to set up a 12 month interest free payment plan if needed.  Applying the monitor   Shave hair from upper left chest.  Hold the abrader disc by orange  tab. Rub the abrader in 40 strokes over left upper chest as indicated in  your monitor instructions.  Clean area with 4 enclosed alcohol pads. Use all pads to ensure the area is cleaned thoroughly. Let  dry.  Apply patch as indicated in monitor instructions. Patch will be placed under collarbone on left side of  chest with arrow pointing upward.  Rub patch adhesive wings for 2 minutes. Remove the white label marked "1". Remove the white label  marked "2". Rub patch adhesive wings for 2 additional minutes.  While looking in a mirror, press and release button in center of patch. A small green light will flash 3-4  times. This will be your only indicator that the monitor has been turned on.  Do not shower for the first 24 hours. You may shower after the first 24 hours.  Press the button if you feel a symptom. You will hear a small click. Record Date, Time and Symptom in  the Patient Log.   Starting the Gateway  In your kit there is a Hydrographic surveyor box the size of a cellphone. This is Airline pilot. It transmits all your  recorded data to [facility]. This box must always stay within 10 feet of you. Open the box and push the *  button. There will be a light that blinks orange and then green a few times. When the light stops  blinking, the Gateway is connected to the ZIO patch. Call [employer] at [phone] to confirm your monitor is transmitting.  Returning your monitor  Remove your patch and place it inside the [place]. In the lower half of the Gateway there is a white  bag with prepaid postage on it. Place Gateway in bag and seal. Mail package back to [place] as soon as  possible. Your physician should have your final report approximately 7 days after you have mailed back  your monitor. Call [place] at [phone] if you have questions regarding your ZIO AT  patch monitor. Call them immediately if you see an orange light blinking on your monitor.  If your monitor  falls off in less than 4 days, contact our Monitor department at [phone]. If your  monitor becomes loose or falls off after 4 days call [employer] at [phone] for suggestions on  securing your monitor   Important Information About Sugar

## 2021-10-21 DIAGNOSIS — E6609 Other obesity due to excess calories: Secondary | ICD-10-CM

## 2021-10-21 DIAGNOSIS — I480 Paroxysmal atrial fibrillation: Secondary | ICD-10-CM

## 2021-10-25 ENCOUNTER — Encounter (HOSPITAL_BASED_OUTPATIENT_CLINIC_OR_DEPARTMENT_OTHER): Payer: 59 | Admitting: Cardiology

## 2021-11-09 DIAGNOSIS — I480 Paroxysmal atrial fibrillation: Secondary | ICD-10-CM | POA: Diagnosis not present

## 2021-12-02 ENCOUNTER — Ambulatory Visit (HOSPITAL_BASED_OUTPATIENT_CLINIC_OR_DEPARTMENT_OTHER): Payer: 59 | Attending: Cardiology | Admitting: Cardiology

## 2021-12-02 VITALS — Ht 73.0 in | Wt 250.0 lb

## 2021-12-02 DIAGNOSIS — R000 Tachycardia, unspecified: Secondary | ICD-10-CM | POA: Diagnosis not present

## 2021-12-02 DIAGNOSIS — I480 Paroxysmal atrial fibrillation: Secondary | ICD-10-CM | POA: Insufficient documentation

## 2021-12-02 DIAGNOSIS — G4733 Obstructive sleep apnea (adult) (pediatric): Secondary | ICD-10-CM

## 2021-12-08 NOTE — Procedures (Signed)
   Patient Name: Allen Wilson, [place] Date: 12/03/2021 Gender: Male D.O.B: 29-Dec-1995 Age (years): 26 Referring Provider: Fransico Him MD, ABSM Height (inches): 80 Interpreting Physician: Fransico Him MD, ABSM Weight (lbs): 250 RPSGT: Jacolyn Reedy BMI: 33 MRN: [id number] Neck Size: 18.00  CLINICAL INFORMATION Sleep Study Type: HST  Indication for sleep study: N/A  Epworth Sleepiness Score: 3  SLEEP STUDY TECHNIQUE A multi-channel overnight portable sleep study was performed. The channels recorded were: nasal airflow, thoracic respiratory movement, and oxygen saturation with a pulse oximetry. Snoring was also monitored.  MEDICATIONS Patient self administered medications include: N/A.  SLEEP ARCHITECTURE Patient was studied for 320.2 minutes. The sleep efficiency was 100.0 % and the patient was supine for 0%. The arousal index was 0.0 per hour.  RESPIRATORY PARAMETERS The overall AHI was 6.2 per hour, with a central apnea index of 0 per hour.  The oxygen nadir was 90% during sleep.  CARDIAC DATA Mean heart rate during sleep was 56.2 bpm.  IMPRESSIONS - Mild obstructive sleep apnea occurred during this study (AHI = 6.2/h). - The patient had minimal or no oxygen desaturation during the study (Min O2 = 90%) - Patient snored 2.9% during the sleep.  DIAGNOSIS - Obstructive Sleep Apnea (G47.33)  RECOMMENDATIONS - Therapeutic CPAP titration to determine optimal pressure required to alleviate sleep disordered breathing. - Positional therapy avoiding supine position during sleep. - Oral appliance may be considered. - Avoid alcohol, sedatives and other CNS depressants that may worsen sleep apnea and disrupt normal sleep architecture. - Sleep hygiene should be reviewed to assess factors that may improve sleep quality. - Weight management and regular exercise should be initiated or continued.  [Electronically signed] 12/08/2021 08:28 PM  Fransico Him MD,  ABSM Diplomate, American Board of Sleep Medicine

## 2021-12-16 ENCOUNTER — Other Ambulatory Visit (HOSPITAL_COMMUNITY): Payer: Self-pay | Admitting: Physician Assistant

## 2021-12-23 ENCOUNTER — Telehealth: Payer: Self-pay | Admitting: *Deleted

## 2021-12-23 DIAGNOSIS — G4733 Obstructive sleep apnea (adult) (pediatric): Secondary | ICD-10-CM

## 2021-12-23 NOTE — Telephone Encounter (Signed)
The patient has been notified of the result and verbalized understanding.  All questions (if any) were answered. Allen Wilson, [name] 12/23/2021 5:32 PM    Upon patient request DME selection is [facility]. Patient understands he will be contacted by [facility] to set up his cpap. Patient understands to call if [facility] does not contact him with new setup in a timely manner. Patient understands they will be called once confirmation has been received from [employer]/ that they have received their new machine to schedule 10 week follow up appointment.   [facility] notified of new cpap order  Please add to airview Patient was grateful for the call and thanked me.

## 2021-12-23 NOTE — Telephone Encounter (Signed)
-----   Message from Lauralee Evener, [place] sent at 12/09/2021  8:22 AM EDT -----  ----- Message ----- From: Sueanne Margarita, MD Sent: 12/08/2021   8:30 PM EDT To: [facility] Sleep Studies  Please let patient know that they have sleep apnea and recommend treating with CPAP.  Please order an auto CPAP from 4-15cm H2O with heated humidity and mask of choice.  Order overnight pulse ox on CPAP.  Followup with me in 6 weeks.

## 2022-01-13 ENCOUNTER — Ambulatory Visit: Payer: Self-pay | Admitting: Physician Assistant

## 2022-01-13 ENCOUNTER — Ambulatory Visit
Admission: RE | Admit: 2022-01-13 | Discharge: 2022-01-13 | Disposition: A | Discharge status: Home / Self Care | Payer: 59 | Source: Ambulatory Visit | Attending: Physician Assistant | Admitting: Physician Assistant

## 2022-01-13 ENCOUNTER — Encounter: Payer: Self-pay | Admitting: Physician Assistant

## 2022-01-13 ENCOUNTER — Ambulatory Visit
Admission: RE | Admit: 2022-01-13 | Discharge: 2022-01-13 | Disposition: A | Discharge status: Home / Self Care | Payer: 59 | Attending: Physician Assistant | Admitting: Physician Assistant

## 2022-01-13 VITALS — BP 118/75 | HR 79 | Temp 98.6°F | Resp 16

## 2022-01-13 DIAGNOSIS — R053 Chronic cough: Secondary | ICD-10-CM | POA: Insufficient documentation

## 2022-01-13 DIAGNOSIS — R062 Wheezing: Secondary | ICD-10-CM | POA: Diagnosis not present

## 2022-01-13 MED ORDER — PSEUDOEPH-BROMPHEN-DM 30-2-10 MG/5ML PO SYRP: 5.0000 mL | ORAL_SOLUTION | Freq: Four times a day (QID) | ORAL | 0 refills | Status: DC | PRN

## 2022-01-13 NOTE — Progress Notes (Signed)
   Subjective: Chronic cough    Patient ID: Allen Wilson, male    DOB: 07/06/1995, 26 y.o.   MRN: [id number]  HPI Patient complain of chronic cough for 6 weeks.  Patient stated wakes up in the morning productive cough and then throughout the day the cough becomes nonproductive.  Onset of complaint 6 weeks ago stated beach house which was very cold.  Patient has fever/chills with complaint.  States there is intermittent wheezing.  Denies tobacco use.  Negative COVID test.  No one else in the family sick.   Review of Systems History of A-fib,drug overdose , and depression    Objective:   Physical Exam BP is 118/75, pulse 79, respirations 16, temperature 98.6, patient 90% O2 sat on room air. HEENT is unremarkable.  Neck is supple for lymphadenopathy or bruits.  Lungs are clear to auscultation.  Patient has a nonproductive cough.  Heart is regular rate and rhythm.       Assessment & Plan: Chronic cough   Further evaluation with chest x-ray.  Patient given a prescription for Bromfed-DM.  Patient will follow-up in 2 days.

## 2022-01-13 NOTE — Progress Notes (Signed)
Cough x6 weeks - productive cough 1st thing in the mornings  States it looks "nasty green" in color. Cough worse 1st thing in morning & subsides around mid-day & no longer productive  Denies facial pain/pressure & ear discomfort  Home covid test 1 1/2 wks ago - Negative  Taking Mucinex & Delsym & Sudafed  AMD

## 2022-01-16 DIAGNOSIS — G4733 Obstructive sleep apnea (adult) (pediatric): Secondary | ICD-10-CM | POA: Diagnosis not present

## 2022-01-21 NOTE — Progress Notes (Unsigned)
Electrophysiology Office Follow up Visit Note:    Date:  01/22/2022   ID:  Dakwan Pridgen, DOB 1996/01/10, MRN [id number]  PCP:  Joni Reining, PA-C  [facility] Cardiologist:  None  [facility] Electrophysiologist:  Lanier Prude, MD    Interval History:    Allen Wilson is a 26 y.o. male who presents for a follow up visit. I last saw him 10/16/2021 for new diagnosis AF. He wore a 2 week Zio after our last appointment which showed no arrhythmias. He takes cardizem 240mg  PO daily.  He is done well overall since I last saw him.  Over the last few days he was sick with a low-grade fever.  He wondered whether or not he was having a urinary tract infection.  In that setting he had an elevated resting heart rate of about 90 bpm.  No SVT or atrial fibrillation episodes.  He is interested in weight loss/medications given the significant weight gain over the last few months.      Past Medical History:  Diagnosis Date   Tachycardia     Past Surgical History:  Procedure Laterality Date   TYMPANOSTOMY TUBE PLACEMENT     childhood    Current Medications: Current Meds  Medication Sig   ALPRAZolam (XANAX) 0.25 MG tablet Take 0.25 mg by mouth daily as needed.   brompheniramine-pseudoephedrine-DM 30-2-10 MG/5ML syrup Take 5 mLs by mouth 4 (four) times daily as needed.   diltiazem (CARDIZEM CD) 240 MG 24 hr capsule TAKE 1 CAPSULE BY MOUTH ONCE DAILY     Allergies:   Patient has no known allergies.   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  Not currently routinely exercising but used to run cross country and play basketball in 02-13-1973.   Social Determinants of Health   Financial Resource Strain: Not on file  Food Insecurity: Not on file  Transportation Needs: Not on file  Physical Activity: Not on file  Stress: Not on file  Social Connections: Not on file     Family History: The patient's family history includes Arthritis/Rheumatoid in his mother; Heart attack in his maternal grandfather; Hypertension in his father; Other in his mother.  ROS:   Please see the history of present illness.    All other systems reviewed and are negative.  EKGs/Labs/Other Studies Reviewed:    The following studies were reviewed today:   Recent Labs: 06/05/2021: ALT 48 08/08/2021: BUN 17; Creatinine, Ser 1.10; Hemoglobin 17.2; Magnesium 2.2; Platelets 276; Potassium 3.7; Sodium 139; TSH 2.061  Recent Lipid Panel    Component Value Date/Time   CHOL 194 06/05/2021 0928   TRIG 187 (H) 06/05/2021 0928   HDL 38 (L) 06/05/2021 06/07/2021  CHOLHDL 5.1 (H) 06/05/2021 0928   LDLCALC 123 (H) 06/05/2021 3532    Physical Exam:    VS:  BP 114/74   Pulse 88   Ht 6\' 1"  (1.854 m)   Wt 256 lb (116.1 kg)   SpO2 98%   BMI 33.78 kg/m     Wt Readings from Last 3 Encounters:  01/22/22 256 lb (116.1 kg)  12/02/21 250 lb (113.4 kg)  10/16/21 251 lb 3.2 oz (113.9 kg)     GEN:  Well nourished, well developed in no acute distress HEENT: Normal NECK: No JVD; No carotid bruits LYMPHATICS: No lymphadenopathy CARDIAC: RRR, no murmurs, rubs, gallops RESPIRATORY:  Clear to auscultation without rales, wheezing or rhonchi  ABDOMEN: Soft, non-tender, non-distended MUSCULOSKELETAL:  No edema; No deformity  SKIN: Warm and dry NEUROLOGIC:  Alert and oriented x  3 PSYCHIATRIC:  Normal affect        ASSESSMENT:    1. Paroxysmal atrial fibrillation (HCC)    PLAN:    In order of problems listed above:  #pAF Likely 2/2 SVT given his history. On diltiazem 240mg  PO daily No recurrent episodes on this medical therapy.  #Obesity Referral to weight loss clinic/Pharm.D. clinic  Follow-up 1 year with APP     Medication Adjustments/Labs and Tests Ordered: Current medicines are reviewed at length with the patient today.  Concerns regarding medicines are outlined above.  No orders of the defined types were placed in this encounter.  No orders of the defined types were placed in this encounter.    Signed, 10/18/21, MD, [facility], [facility] 01/22/2022 8:58 AM    Electrophysiology [facility]

## 2022-01-22 ENCOUNTER — Ambulatory Visit: Payer: 59 | Attending: Cardiology | Admitting: Cardiology

## 2022-01-22 ENCOUNTER — Encounter: Payer: Self-pay | Admitting: Cardiology

## 2022-01-22 VITALS — BP 114/74 | HR 88 | Ht 73.0 in | Wt 256.0 lb

## 2022-01-22 DIAGNOSIS — E6609 Other obesity due to excess calories: Secondary | ICD-10-CM

## 2022-01-22 DIAGNOSIS — I480 Paroxysmal atrial fibrillation: Secondary | ICD-10-CM | POA: Diagnosis not present

## 2022-01-22 NOTE — Patient Instructions (Addendum)
Medication Instructions:  Your physician recommends that you continue on your current medications as directed. Please refer to the Current Medication list given to you today.  *If you need a refill on your cardiac medications before your next appointment, please call your pharmacy*   Lab Work: None ordered   Testing/Procedures: None ordered   Follow-Up: At [facility], you and your health needs are our priority.  As part of our continuing mission to provide you with exceptional heart care, we have created designated Provider Care Teams.  These Care Teams include your primary Cardiologist (physician) and Advanced Practice Providers (APPs -  Physician Assistants and Nurse Practitioners) who all work together to provide you with the care you need, when you need it.  You have been referred to weight management clinic   Your next appointment:   1 year(s)  The format for your next appointment:   In Person  Provider:   You will see one of the following Advanced Practice Providers on your designated Care Team:   Nicolasa Ducking, NP Eula Listen, PA-C Cadence Fransico Michael, PA-C Charlsie Quest, NP    Thank you for choosing [facility]!!   [phone]  Other Instructions    Important Information About Sugar

## 2022-01-23 ENCOUNTER — Ambulatory Visit: Payer: Self-pay | Admitting: Physician Assistant

## 2022-01-23 ENCOUNTER — Encounter: Payer: Self-pay | Admitting: Physician Assistant

## 2022-01-23 ENCOUNTER — Ambulatory Visit
Admission: RE | Admit: 2022-01-23 | Discharge: 2022-01-23 | Disposition: A | Discharge status: Home / Self Care | Payer: 59 | Source: Ambulatory Visit | Attending: Physician Assistant | Admitting: Physician Assistant

## 2022-01-23 VITALS — BP 149/96 | HR 100 | Temp 101.0°F | Resp 12

## 2022-01-23 DIAGNOSIS — N50819 Testicular pain, unspecified: Secondary | ICD-10-CM | POA: Diagnosis not present

## 2022-01-23 DIAGNOSIS — N5082 Scrotal pain: Secondary | ICD-10-CM | POA: Diagnosis not present

## 2022-01-23 DIAGNOSIS — N433 Hydrocele, unspecified: Secondary | ICD-10-CM | POA: Diagnosis not present

## 2022-01-23 LAB — POCT URINALYSIS DIPSTICK
Bilirubin, UA: NEGATIVE
Blood, UA: NEGATIVE
Glucose, UA: NEGATIVE
Ketones, UA: NEGATIVE
Leukocytes, UA: NEGATIVE
Nitrite, UA: NEGATIVE
Protein, UA: POSITIVE — AB
Spec Grav, UA: 1.025 (ref 1.010–1.025)
Urobilinogen, UA: 1 E.U./dL
pH, UA: 6 (ref 5.0–8.0)

## 2022-01-23 NOTE — Progress Notes (Signed)
   Subjective: Scrotum pain    Patient ID: Allen Wilson, male    DOB: 05-Apr-1995, 26 y.o.   MRN: [id number]  HPI Patient complains of 4 days of scrotal pain which started in the left testicle migrated to the right.  Denies urinary complaint.  No urethral discharge.  No provocative incident for complaint.  Patient has a history of testicle torsion in high school.  States similar pain but not as intense.   Review of Systems Negative except for chief complaint    Objective:   Physical Exam BP is 146/96, pulse 100, respiration 12, temperature 101, patient 99% O2 sat on room air.  UA is negative except for positive protein which has been noticed in the past 2 years.     Assessment & Plan: Scrotal pain   Discussed differentials consisting of epididymitis and mild testicle torsion.  Patient scheduled for stat ultrasound but advised to go to the emergency room if condition worsen before procedure.

## 2022-01-23 NOTE — Progress Notes (Signed)
Sunday , Monday & Tuesday left testicle started hurting & then moved to the right testicle yesterday.  States not tender - dull ache.  Having some low back pain & low abd pain. Denies N/V/D Denies urinary issue - no problems with voiding.  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

## 2022-01-24 NOTE — Addendum Note (Signed)
Addended by: Gardner Candle on: 01/24/2022 10:03 AM   Modules accepted: Orders

## 2022-01-30 DIAGNOSIS — G4733 Obstructive sleep apnea (adult) (pediatric): Secondary | ICD-10-CM | POA: Diagnosis not present

## 2022-02-06 ENCOUNTER — Ambulatory Visit (INDEPENDENT_AMBULATORY_CARE_PROVIDER_SITE_OTHER): Payer: 59 | Admitting: Urology

## 2022-02-06 ENCOUNTER — Encounter: Payer: Self-pay | Admitting: Urology

## 2022-02-06 VITALS — BP 148/86 | HR 116 | Ht 73.0 in | Wt 250.0 lb

## 2022-02-06 DIAGNOSIS — Z87438 Personal history of other diseases of male genital organs: Secondary | ICD-10-CM | POA: Diagnosis not present

## 2022-02-06 DIAGNOSIS — N5082 Scrotal pain: Secondary | ICD-10-CM

## 2022-02-06 NOTE — Progress Notes (Signed)
   02/06/22 9:07 AM   Allen Wilson 11-02-1995 [id number]  CC: Scrotal pain  HPI: 26 year old male who developed some dull left-sided scrotal pain on 01/23/2022.  This then migrated over to the right side and lasted a few days.  He had a scrotal ultrasound that was benign aside from some small physiologic hydroceles.  He reportedly has a history of testicular torsion in high school that resolved spontaneously, and he never required surgery for that.  He denies any pain today and symptoms have completely resolved.  Notably he was also diagnosed with the flu around the time of his scrotal pain.  Denies any urinary symptoms, no history of STDs, no gross hematuria.  Dipstick urinalysis at time of presentation was benign.   PMH: Past Medical History:  Diagnosis Date   Tachycardia     Surgical History: Past Surgical History:  Procedure Laterality Date   TYMPANOSTOMY TUBE PLACEMENT     childhood     Family History: Family History  Problem Relation Age of Onset   Other Mother        alive & well.   Arthritis/Rheumatoid Mother    Hypertension Father    Heart attack Maternal Grandfather        x 2 - first in his 55's.    Social History:  reports that he has never smoked. His smokeless tobacco use includes chew. He reports current alcohol use of about 3.0 standard drinks of alcohol per week. He reports that he does not use drugs.  Physical Exam: BP (!) 148/86 (BP Location: Left Arm, Patient Position: Sitting, Cuff Size: Large)   Pulse (!) 116   Ht 6\' 1"  (1.854 m)   Wt 250 lb (113.4 kg)   BMI 32.98 kg/m    Constitutional:  Alert and oriented, No acute distress. Cardiovascular: No clubbing, cyanosis, or edema. Respiratory: Normal respiratory effort, no increased work of breathing. GI: Abdomen is soft, nontender, nondistended, no abdominal masses GU: Circumcised phallus with patent meatus, no lesions, testicles 20 cc and descended bilaterally without masses, no  pain   Pertinent Imaging: I have personally viewed and interpreted the scrotal ultrasound showing no masses and small physiologic hydroceles bilaterally.  Assessment & Plan:   26 year old male with a few days of bilateral dull scrotal pain that has since resolved, with normal scrotal ultrasound, normal physical exam, benign urinalysis.  Reassurance was provided, and we reviewed behavioral strategies including snug fitting underwear, NSAIDs, as well as return precautions that would warrant stat evaluation with a repeat ultrasound in the future including severe unilateral scrotal pain that could be worrisome for torsion.  Some pelvic floor stretching exercises were also provided.  Follow-up with urology as needed  22, MD 02/06/2022  [facility] [address] [place], [place] [phone]

## 2022-02-15 DIAGNOSIS — G4733 Obstructive sleep apnea (adult) (pediatric): Secondary | ICD-10-CM | POA: Diagnosis not present

## 2022-02-25 ENCOUNTER — Other Ambulatory Visit: Payer: Self-pay | Admitting: Cardiology

## 2022-02-26 ENCOUNTER — Telehealth: Payer: Self-pay | Admitting: Cardiology

## 2022-02-26 NOTE — Telephone Encounter (Signed)
Called to schedule sleep compliance appt, pt states that he is going to turn in his machine.

## 2022-03-02 DIAGNOSIS — G4733 Obstructive sleep apnea (adult) (pediatric): Secondary | ICD-10-CM | POA: Diagnosis not present

## 2022-03-11 ENCOUNTER — Encounter: Payer: Self-pay | Admitting: Physician Assistant

## 2022-03-11 ENCOUNTER — Ambulatory Visit: Payer: Self-pay | Admitting: Physician Assistant

## 2022-03-11 DIAGNOSIS — H6503 Acute serous otitis media, bilateral: Secondary | ICD-10-CM

## 2022-03-11 MED ORDER — FEXOFENADINE-PSEUDOEPHED ER 60-120 MG PO TB12: 1.0000 | ORAL_TABLET | Freq: Two times a day (BID) | ORAL | 0 refills | Status: DC

## 2022-03-11 MED ORDER — AMOXICILLIN-POT CLAVULANATE 875-125 MG PO TABS: 1.0000 | ORAL_TABLET | Freq: Two times a day (BID) | ORAL | 0 refills | Status: DC

## 2022-03-11 NOTE — Progress Notes (Signed)
Pt presents today with left ear ache and some discomfort in the right ear x 1 day. Burna Sis

## 2022-03-11 NOTE — Progress Notes (Signed)
   Subjective: Bilateral ear pain    Patient ID: Allen Wilson, male    DOB: 19-May-1995, 27 y.o.   MRN: [id number]  HPI Patient complains of bilateral ear pain left greater than right to 3 days.  Patient also complaining of sinus congestion.  Denies fever/chills associated complaint.  Denies hearing loss or vertigo.  No recent travel or known contact with COVID-19.   Review of Systems Anxiety/depression and paroxysmal atrial fibrillation    Objective:   Physical Exam  See nurses notes and vital signs. HEENT is remarkable for bilateral TM scar tissue.  TM is edematous and erythematous. Neck is supple for lymphadenopathy or bruits. Lungs are clear to auscultation. Heart is regular rate and rhythm.      Assessment & Plan: Otitis media   Patient given a prescription for Augmentin and Allegra-D.  Advised to follow-up with no improvement or worsening complaint in 5 days.

## 2022-03-17 ENCOUNTER — Encounter: Payer: 59 | Attending: Cardiology | Admitting: Dietician

## 2022-03-17 ENCOUNTER — Encounter: Payer: Self-pay | Admitting: Dietician

## 2022-03-17 VITALS — Ht 73.0 in

## 2022-03-17 DIAGNOSIS — I4891 Unspecified atrial fibrillation: Secondary | ICD-10-CM | POA: Diagnosis not present

## 2022-03-17 DIAGNOSIS — G4730 Sleep apnea, unspecified: Secondary | ICD-10-CM | POA: Insufficient documentation

## 2022-03-17 DIAGNOSIS — E669 Obesity, unspecified: Secondary | ICD-10-CM | POA: Diagnosis not present

## 2022-03-17 DIAGNOSIS — Z713 Dietary counseling and surveillance: Secondary | ICD-10-CM | POA: Insufficient documentation

## 2022-03-17 DIAGNOSIS — Z6832 Body mass index (BMI) 32.0-32.9, adult: Secondary | ICD-10-CM | POA: Diagnosis not present

## 2022-03-17 DIAGNOSIS — I480 Paroxysmal atrial fibrillation: Secondary | ICD-10-CM

## 2022-03-17 NOTE — Progress Notes (Signed)
Medical Nutrition Therapy: Visit start time: 8416  end time: 1145  Assessment:   Referral Diagnosis: obesity Other medical history/ diagnoses: atrial fibrillation, sleep apnea Psychosocial issues/ stress concerns: none  Medications, supplements: reconciled list in medical record   Preferred learning method:  Visual   Current weight: 252.6lbs Height: 6'1" BMI: 32.98 Patient's personal weight goal: 225lbs  Progress and evaluation:  Patient reports making diet changes; has decreased fried foods/ fast foods and stopped drinking sodas Has not yet lost weight, fluctuates between 250-256. This concerns patient, and he is looking into possibility of taking weight loss medication. Lowest recent weight was about 232lbs 07/2021 before having a-fib episode and diagnosis of sleep apnea. 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 His goal is to lose enough weight to reduce risk of A-fib and eliminate need for medication as well as C-PAP.  He reports his grandfather had 3 heart attacks, first was at age 37. Food allergies: none Special diet practices: none Patient seeks help with long term weight loss to reduce health risk. Next PCP appt is 05/2022   Dietary Intake:  Usual eating pattern includes 2-3 meals and 0 snacks per day. Dining out frequency: 4 meals per week. Who plans meals/ buys groceries? 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, coworkers (at fire station)  Breakfast: sometimes missed according to work schedule; skips when home; cereal or takeout Snack: none Lunch: grilled chicken/ pork chop Snack: none Supper: red meat most often + pasta + veg Snack: usually none Beverages: water, occasional energy drink white monster  Physical activity: cardio 1 hour 4x a week (work requirement)   Intervention:   Nutrition Care Education:   Basic nutrition: basic food groups; appropriate nutrient balance; appropriate meal  and snack schedule; general nutrition guidelines    Weight control: importance of low sugar and low fat choices; appropriate food portions; estimated energy needs for weight loss at 2000 kcal, provided guidance for 45% CHO, 25% pro, 30% fat; discussed tracking food intake at least for a period of time to compare to goals; discussed effects on metabolic rate from frequent skipped meals/ long periods of time without meals; effects of disrupted/ inadequate sleep/ night work; role of physical activity; intensive weight loss programs Other: advised diet low in unhealthy fats and sodium, with emphasis on vegetables and fruits  Other intervention notes: Patient has been working on positive dietary changes and is motivated to continue.  Established goals for additional change in effort to improve weight loss success and reduce health risk. No follow up scheduled at this time; patient will follow up with PCP and schedule additional MNT later as needed.   Nutritional Diagnosis:  [place]-3.3 Overweight/obesity As related to history of excess calories, night shift work, erratic meal schedule.  As evidenced by patient with current BMI of 32.98.   Education Materials given:  Museum/gallery conservator with food lists, sample meal pattern Visit summary with goals/ instructions   Learner/ who was taught:  Patient   Level of understanding: Verbalizes/ demonstrates competency   Demonstrated degree of understanding via:   Teach back Learning barriers: None   Willingness to learn/ readiness for change: Eager, change in progress   Monitoring and Evaluation:  Dietary intake, exercise, and body weight      follow up: prn

## 2022-03-17 NOTE — Patient Instructions (Signed)
Plan to have at least a small meal or snack every 4 hours (average) during the awake times, especially daytime hours.  Try tracking food intake with an app such as MyFitnessPal or Lose It.  Continue to eat mostly low fat and low sugar foods and drinks.  Keep up regular exercise, great job! Consider adding some resistance exercise/ weight training for more muscle building, if cleared by cardiologist.

## 2022-03-18 ENCOUNTER — Encounter: Payer: Self-pay | Admitting: Cardiology

## 2022-03-18 ENCOUNTER — Encounter: Payer: Self-pay | Admitting: Physician Assistant

## 2022-03-18 ENCOUNTER — Ambulatory Visit: Payer: Self-pay | Admitting: Physician Assistant

## 2022-03-18 ENCOUNTER — Telehealth: Payer: Self-pay | Admitting: Cardiology

## 2022-03-18 DIAGNOSIS — G4733 Obstructive sleep apnea (adult) (pediatric): Secondary | ICD-10-CM | POA: Diagnosis not present

## 2022-03-18 NOTE — Telephone Encounter (Signed)
Pt made aware of Pharm D recommendations and verbalized understanding.  

## 2022-03-18 NOTE — Progress Notes (Signed)
   Subjective: Weight loss    Patient ID: Allen Wilson, male    DOB: [birth year], 27 y.o.   MRN: [id number]  HPI Patient is requesting for phentermine for weight loss.  Patient states he has been followed by a nutritionist for weight loss but has not showed much improvement.  Patient also has a history of afibrillation and takes Cardizem and is followed by cardiologist.  Patient family history is remarkable for cardiovascular disease and obesity.  Patient is not a diabetic and did not meet criteria for Ozempic.  Review of Systems A-fib and sleep apnea.    Objective:   Physical Exam BP is 144/102, pulse 77, respiration 12, and patient 99% O2 sat on room air.  Patient weighs 253 pounds and BMI is 33.38.       Assessment & Plan: Obesity and atrial fibrillation   Advised patient we will consider phentermine pending approval from cardiologist.

## 2022-03-18 NOTE — Progress Notes (Signed)
Pt wants to discuss weight loss per cardiologist.  Pt did see a nutritionist but it did not go as planned.

## 2022-03-18 NOTE — Telephone Encounter (Signed)
Pt c/o medication issue:  1. Name of Medication: Phentermine 30mg   2. How are you currently taking this medication (dosage and times per day)?  1 time a day  3. Are you having a reaction (difficulty breathing--STAT)?   4. What is your medication issue? Patient said his primary doctor wants to know if this medicine is alright for patient to take-needs  Dr Mardene Speak permission to take this meddicine

## 2022-03-18 NOTE — Telephone Encounter (Signed)
Do not recommend stimulants in patients with atrial fibrillation

## 2022-03-19 ENCOUNTER — Encounter: Payer: Self-pay | Admitting: Physician Assistant

## 2022-03-25 ENCOUNTER — Emergency Department
Admission: EM | Admit: 2022-03-25 | Discharge: 2022-03-25 | Disposition: A | Discharge status: Home / Self Care | Payer: 59 | Attending: Student in an Organized Health Care Education/Training Program | Admitting: Student in an Organized Health Care Education/Training Program

## 2022-03-25 ENCOUNTER — Encounter: Payer: Self-pay | Admitting: Emergency Medicine

## 2022-03-25 ENCOUNTER — Other Ambulatory Visit: Payer: Self-pay

## 2022-03-25 ENCOUNTER — Emergency Department: Payer: 59

## 2022-03-25 DIAGNOSIS — R002 Palpitations: Secondary | ICD-10-CM | POA: Diagnosis not present

## 2022-03-25 DIAGNOSIS — I4891 Unspecified atrial fibrillation: Secondary | ICD-10-CM | POA: Diagnosis not present

## 2022-03-25 LAB — CBC
HCT: 47.7 % (ref 39.0–52.0)
Hemoglobin: 16.3 g/dL (ref 13.0–17.0)
MCH: 29.1 pg (ref 26.0–34.0)
MCHC: 34.2 g/dL (ref 30.0–36.0)
MCV: 85.2 fL (ref 80.0–100.0)
Platelets: 282 10*3/uL (ref 150–400)
RBC: 5.6 MIL/uL (ref 4.22–5.81)
RDW: 12 % (ref 11.5–15.5)
WBC: 10.2 10*3/uL (ref 4.0–10.5)
nRBC: 0 % (ref 0.0–0.2)

## 2022-03-25 LAB — BASIC METABOLIC PANEL
Anion gap: 11 (ref 5–15)
BUN: 19 mg/dL (ref 6–20)
CO2: 26 mmol/L (ref 22–32)
Calcium: 9.7 mg/dL (ref 8.9–10.3)
Chloride: 101 mmol/L (ref 98–111)
Creatinine, Ser: 1.19 mg/dL (ref 0.61–1.24)
GFR, Estimated: >60 mL/min (ref >60)
Glucose, Bld: 98 mg/dL (ref 70–99)
Potassium: 3.5 mmol/L (ref 3.5–5.1)
Sodium: 138 mmol/L (ref 135–145)

## 2022-03-25 LAB — D-DIMER, QUANTITATIVE: D-Dimer, Quant: 0.35 ug/mL-FEU (ref 0.00–0.50)

## 2022-03-25 LAB — TROPONIN I (HIGH SENSITIVITY): Troponin I (High Sensitivity): <2 ng/L (ref <18)

## 2022-03-25 MED ORDER — DILTIAZEM HCL 30 MG PO TABS: 30.0000 mg | ORAL_TABLET | Freq: Two times a day (BID) | ORAL | 0 refills | Status: DC | PRN

## 2022-03-25 NOTE — ED Notes (Signed)
Pt placed on the cardiac monitor.

## 2022-03-25 NOTE — ED Triage Notes (Signed)
Pt comes with c/o afib while at work today. Pt does have hx of this. Pt states he has been taking meds for it. Pt states he had two episodes happen today. Pt states he feels the fluttering. Pt denies any cp.

## 2022-03-25 NOTE — ED Notes (Signed)
Dr. Quentin Cornwall advised the pt's 12 lead had already been completed.

## 2022-03-25 NOTE — ED Provider Notes (Signed)
[facility] Provider Note    Event Date/Time   First MD Initiated Contact with Patient 03/25/22 1747     (approximate)   History   Atrial Fibrillation   HPI  Allen Wilson is a 27 y.o. male with a history of A-fib on Cardizem CD 240 compliant with this presents to the ER for episode of palpitations A-fib fib with rapid heart rate today while he was at work sitting through presentation.  Feels like his heart rate improved then started feeling weak and like he was about to pass out.  States he was going to be eval by this ED physician but was directed to the ER.  He denies any palpitations currently no chest pain or shortness of breath.  No lower extremity swelling.  No pleuritic pain.  No recent illness.     Physical Exam   Triage Vital Signs: ED Triage Vitals  Enc Vitals Group     BP 03/25/22 1555 (!) 154/100     Pulse Rate 03/25/22 1555 86     Resp 03/25/22 1555 17     Temp 03/25/22 1555 98.6 F (37 C)     Temp src --      SpO2 03/25/22 1555 99 %     Weight --      Height --      Head Circumference --      Peak Flow --      Pain Score 03/25/22 1554 0     Pain Loc --      Pain Edu? --      Excl. in [place]? --     Most recent vital signs: Vitals:   03/25/22 1555 03/25/22 1913  BP: (!) 154/100 (!) 125/90  Pulse: 86 92  Resp: 17 20  Temp: 98.6 F (37 C)   SpO2: 99% 98%     Constitutional: Alert  Eyes: Conjunctivae are normal.  Head: Atraumatic. Nose: No congestion/rhinnorhea. Mouth/Throat: Mucous membranes are moist.   Neck: Painless ROM.  Cardiovascular:   Good peripheral circulation. Respiratory: Normal respiratory effort.  No retractions.  Gastrointestinal: Soft and nontender.  Musculoskeletal:  no deformity Neurologic:  MAE spontaneously. No gross focal neurologic deficits are appreciated.  Skin:  Skin is warm, dry and intact. No rash noted. Psychiatric: Mood and affect are normal. Speech and behavior are  normal.    ED Results / Procedures / Treatments   Labs (all labs ordered are listed, but only abnormal results are displayed) Labs Reviewed  BASIC METABOLIC PANEL  CBC  D-DIMER, QUANTITATIVE  TROPONIN I (HIGH SENSITIVITY)     EKG  ED ECG REPORT I, Merlyn Lot, the attending physician, personally viewed and interpreted this ECG.   Date: 03/25/2022  EKG Time: 15:58  Rate: 100  Rhythm: sinus  Axis: normal  Intervals:normal  ST&T Change: nonspecific st abn, no stemi    RADIOLOGY Please see ED Course for my review and interpretation.  I personally reviewed all radiographic images ordered to evaluate for the above acute complaints and reviewed radiology reports and findings.  These findings were personally discussed with the patient.  Please see medical record for radiology report.    PROCEDURES:  Critical Care performed:   Procedures   MEDICATIONS ORDERED IN ED: Medications - No data to display   IMPRESSION / MDM / [place] / ED COURSE  I reviewed the triage vital signs and the nursing notes.  Differential diagnosis includes, but is not limited to, A-fib, dysrhythmia, SVT, electrolyte abnormality, PE, ACS, pneumothorax, pneumonia  Patient presenting to the ER for evaluation of symptoms as described above.  Based on symptoms, risk factors and considered above differential, this presenting complaint could reflect a potentially life-threatening illness therefore the patient will be placed on continuous pulse oximetry and telemetry for monitoring.  Laboratory evaluation will be sent to evaluate for the above complaints.  Patient well-appearing.  Low risk by Wells criteria but he is tachycardic EKG heart rate 101.  Will order D-dimer to further stratify he is not on anticoagulation.  Currently in sinus.  Doubt ACS.  Chest x-ray on my review and interpretation without evidence of consolidation or infiltrate no evidence of  pneumothorax.   D-dimer negative, Trope negative.  Patient remains well-appearing in no acute distress.  At this point do believe patient stable and appropriate for outpatient follow-up.  Will give prescription for a few short acting Cardizem 30s to take if he is having palpitations or his heart rate is high.  Discussed signs and symptoms for which she should return to the ER.   FINAL CLINICAL IMPRESSION(S) / ED DIAGNOSES   Final diagnoses:  Palpitations     Rx / DC Orders   ED Discharge Orders          Ordered    Ambulatory referral to Cardiology       Comments: If you have not heard from the Cardiology office within the next 72 hours please call [phone].   03/25/22 1908    diltiazem (CARDIZEM) 30 MG tablet  2 times daily PRN        03/25/22 1908             Note:  This document was prepared using Dragon voice recognition software and may include unintentional dictation errors.    Merlyn Lot, MD 03/25/22 [phone]

## 2022-03-26 ENCOUNTER — Ambulatory Visit: Payer: 59 | Attending: Cardiology | Admitting: Cardiology

## 2022-03-26 ENCOUNTER — Encounter: Payer: Self-pay | Admitting: Cardiology

## 2022-03-26 VITALS — BP 128/78 | HR 81 | Ht 73.0 in | Wt 248.6 lb

## 2022-03-26 DIAGNOSIS — I480 Paroxysmal atrial fibrillation: Secondary | ICD-10-CM | POA: Diagnosis not present

## 2022-03-26 NOTE — Progress Notes (Signed)
Electrophysiology Office Follow up Visit Note:    Date:  03/26/2022   ID:  Allen Wilson, DOB December 14, 1995, MRN [id number]  PCP:  Sable Feil, PA-C  [facility] Cardiologist:  None  [facility] Electrophysiologist:  Vickie Epley, MD    Interval History:    Allen Wilson is a 27 y.o. male who presents for a follow up visit.  I have seen the patient most recently in December for history of SVT and atrial fibrillation.  He was seen in the ER yesterday after experiencing SVT while at work.  He says he felt his heart start to race.  His pulse was 163 bpm and irregular.  EKG confirmed atrial fibrillation at work.  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  By the time he was in the emergency department his rhythm had returned to normal.  The ER doctor added an as needed diltiazem for heart rates greater than 100.  He tells me that this was the first episodes of arrhythmia since June.  Otherwise has been doing well.  He is working on weight loss.       Past Medical History:  Diagnosis Date   Tachycardia     Past Surgical History:  Procedure Laterality Date   TYMPANOSTOMY TUBE PLACEMENT     childhood    Current Medications: Current Meds  Medication Sig   ALPRAZolam (XANAX) 0.25 MG tablet Take 0.25 mg by mouth daily as needed.   diltiazem (CARDIZEM CD) 240 MG 24 hr capsule TAKE 1 CAPSULE BY MOUTH ONCE DAILY   diltiazem (CARDIZEM) 30 MG tablet Take 1 tablet (30 mg total) by mouth 2 (two) times daily as needed (HR>100).   [DISCONTINUED] amoxicillin-clavulanate (AUGMENTIN) 875-125 MG tablet Take 1 tablet by mouth 2 (two) times daily.   [DISCONTINUED] fexofenadine-pseudoephedrine (ALLEGRA-D) 60-120 MG 12 hr tablet Take 1 tablet by mouth 2 (two) times daily.     Allergies:   Patient has no known allergies.   Social History   Socioeconomic  History   Marital status: Single    Spouse name: Not on file   Number of children: Not on file   Years of education: Not on file   Highest education level: Not on file  Occupational History   Occupation: EMT  Tobacco Use   Smoking status: Never   Smokeless tobacco: Current    Types: Chew  Vaping Use   Vaping Use: Never used  Substance and Sexual Activity   Alcohol use: Yes    Alcohol/week: 5.0 standard drinks of alcohol    Types: 5 Cans of beer per week   Drug use: No   Sexual activity: Yes  Other Topics Concern   Not on file  Social History Narrative   Works full time as EMT.  Lives with mother.  Not currently routinely exercising but used to run cross country and play basketball in [employer].   Social Determinants of Health   Financial Resource Strain: Not on file  Food Insecurity: Not on file  Transportation Needs: Not on file  Physical Activity: Not on file  Stress: Not on file  Social Connections: Not on file     Family History: The patient's family history includes Arthritis/Rheumatoid in his mother; Heart attack in his maternal grandfather; Hypertension in his father; Other in his mother.  ROS:   Please see the history of present illness.  All other systems reviewed and are negative.  EKGs/Labs/Other Studies Reviewed:    The following studies were reviewed today: ER records    Recent Labs: 06/05/2021: ALT 48 08/08/2021: Magnesium 2.2; TSH 2.061 03/25/2022: BUN 19; Creatinine, Ser 1.19; Hemoglobin 16.3; Platelets 282; Potassium 3.5; Sodium 138  Recent Lipid Panel    Component Value Date/Time   CHOL 194 06/05/2021 0928   TRIG 187 (H) 06/05/2021 0928   HDL 38 (L) 06/05/2021 0928   CHOLHDL 5.1 (H) 06/05/2021 0928   LDLCALC 123 (H) 06/05/2021 6433    Physical Exam:    VS:  BP 128/78   Pulse 81   Ht 6\' 1"  (1.854 m)   Wt 248 lb 9.6 oz (112.8 kg)   SpO2 97%   BMI 32.80 kg/m     Wt Readings from Last 3 Encounters:  03/26/22 248 lb 9.6 oz (112.8  kg)  03/25/22 252 lb 13.9 oz (114.7 kg)  03/18/22 253 lb (114.8 kg)     GEN:  Well nourished, well developed in no acute distress CARDIAC: RRR, no murmurs, rubs, gallops RESPIRATORY:  Clear to auscultation without rales, wheezing or rhonchi        ASSESSMENT:    No diagnosis found. PLAN:    In order of problems listed above:  #Paroxysmal atrial fibrillation #SVT Overall low burden but heavily symptomatic when he is out of rhythm. We discussed treatment options including continuing with diltiazem, adding an antiarrhythmic or proceeding with ablation.  We also discussed performing additional monitoring to further tease out whether or not there is an SVT that is consistently leading to atrial fibrillation.  Long-term, I think EP study and ablation will be the preferred strategy but I would like to have them refine the ablation strategy before going in that direction.  I discussed loop recorder monitoring in detail with the patient including the risks and monthly monitoring costs and he wishes to proceed.  We will plan for a Medtronic loop recorder implant in the next few weeks.  He will stay on the diltiazem to 40 mg by mouth once daily.  Will plan for follow-up 6 months after loop implant to review results and to determine next steps.     Medication Adjustments/Labs and Tests Ordered: Current medicines are reviewed at length with the patient today.  Concerns regarding medicines are outlined above.  No orders of the defined types were placed in this encounter.  No orders of the defined types were placed in this encounter.    Signed, Lars Mage, MD, [facility], [facility] 03/26/2022 12:20 PM    Electrophysiology [facility]

## 2022-03-26 NOTE — Patient Instructions (Signed)
Medication Instructions:  Your physician recommends that you continue on your current medications as directed. Please refer to the Current Medication list given to you today.  *If you need a refill on your cardiac medications before your next appointment, please call your pharmacy*  Testing/Procedures: You will have a loop recorder implanted at your next office visit. There are no restrictions or special instructions prior to this appointment.  Follow-Up: At [facility], you and your health needs are our priority.  As part of our continuing mission to provide you with exceptional heart care, we have created designated Provider Care Teams.  These Care Teams include your primary Cardiologist (physician) and Advanced Practice Providers (APPs -  Physician Assistants and Nurse Practitioners) who all work together to provide you with the care you need, when you need it.   Your next appointment:   Next available for loop recorder implant  Provider:   Vickie Epley, MD

## 2022-04-02 DIAGNOSIS — G4733 Obstructive sleep apnea (adult) (pediatric): Secondary | ICD-10-CM | POA: Diagnosis not present

## 2022-04-15 NOTE — Progress Notes (Unsigned)
Electrophysiology Office Follow up Visit Note:    Date:  04/16/2022   ID:  Allen Wilson, DOB 14-Jul-1995, MRN [id number]  PCP:  Sable Feil, PA-C  [facility] Cardiologist:  None  [facility] Electrophysiologist:  Allen Epley, MD    Interval History:    Allen Wilson is a 27 y.o. male who presents for a follow up visit.   Last seen 03/26/2022. At the last appointment we discussed using a loop recorder for better characterization of his abnormal heart rhythms so that a treatment strategy could be refined. He presents today for ILR implant.   Since I last saw him he has had some palpitations at night but no sustained episodes of arrhythmia.     Past medical, surgical, social and family history were reviewed.  ROS:   Please see the history of present illness.    All other systems reviewed and are negative.  EKGs/Labs/Other Studies Reviewed:    The following studies were reviewed today:     Physical Exam:    VS:  BP (!) 146/82   Pulse 87   Ht '6\' 1"'$  (1.854 m)   Wt 254 lb 9.6 oz (115.5 kg)   SpO2 96%   BMI 33.59 kg/m     Wt Readings from Last 3 Encounters:  04/16/22 254 lb 9.6 oz (115.5 kg)  03/26/22 248 lb 9.6 oz (112.8 kg)  03/25/22 252 lb 13.9 oz (114.7 kg)     GEN:  Well nourished, well developed in no acute distress CARDIAC: RRR, no murmurs, rubs, gallops RESPIRATORY:  Clear to auscultation without rales, wheezing or rhonchi       ASSESSMENT:    1. Paroxysmal atrial fibrillation (HCC)   2. Obesity due to excess calories, unspecified classification, unspecified whether serious comorbidity present    PLAN:    In order of problems listed above:   #pAF Low burden but heavily symptomatic. Presents for ILR implant. Discussed the procedure including risks and montly monitoring costs and he wishes to proceed.     Signed, Allen Mage, MD, [facility], [facility] 04/16/2022 8:45 AM    Electrophysiology [facility]  Heart[facility]    ----------------------------   SURGEON:  Allen Epley, MD     PREPROCEDURE DIAGNOSIS:  Atrial fibrillation    POSTPROCEDURE DIAGNOSIS: Atrial fibrillation     PROCEDURES:   1. Implantable loop recorder implantation    INTRODUCTION:  Allen Wilson presents with a history of atrial fibrillation The costs of loop recorder monitoring have been discussed with the patient.    DESCRIPTION OF PROCEDURE:  Informed written consent was obtained.  The patient required no sedation for the procedure today.  Mapping over the patient's chest was performed to identify the area where electrograms were most prominent for ILR recording.  This area was found to be the left parasternal region over the 4th intercostal space. The patients left chest was therefore prepped and draped in the usual sterile fashion. The skin overlying the left parasternal region was infiltrated with lidocaine for local analgesia.  A 0.5-cm incision was made over the left parasternal region over the 3rd intercostal space.  A subcutaneous ILR pocket was fashioned using a combination of sharp and blunt dissection.  A Medtronic Reveal Allen Wilson [phone] [id number]) implantable loop recorder was then placed into the pocket  R waves were very prominent and measured >0.56m.  Steri- Strips and a sterile dressing were then applied.  There were no early apparent complications.  CONCLUSIONS:   1. Successful implantation of a implantable loop recorder for Atrial fibrillation  2. No early apparent complications.   Lysbeth Galas T. Quentin Ore, MD, [facility], [facility] Cardiac Electrophysiology

## 2022-04-16 ENCOUNTER — Encounter: Payer: Self-pay | Admitting: Cardiology

## 2022-04-16 ENCOUNTER — Ambulatory Visit: Payer: 59 | Attending: Cardiology | Admitting: Cardiology

## 2022-04-16 VITALS — BP 146/82 | HR 87 | Ht 73.0 in | Wt 254.6 lb

## 2022-04-16 DIAGNOSIS — E6609 Other obesity due to excess calories: Secondary | ICD-10-CM

## 2022-04-16 DIAGNOSIS — I480 Paroxysmal atrial fibrillation: Secondary | ICD-10-CM

## 2022-04-16 NOTE — Patient Instructions (Signed)
Medication Instructions:  Your physician recommends that you continue on your current medications as directed. Please refer to the Current Medication list given to you today.  Labwork: None ordered.  Testing/Procedures: None ordered.  Follow-Up:  Your physician wants you to follow-up in: one year with Dr. Quentin Ore.  You will receive a reminder letter in the mail two months in advance. If you don't receive a letter, please call our office to schedule the follow-up appointment.   Implantable Loop Recorder Placement, Care After This sheet gives you information about how to care for yourself after your procedure. Your health care provider may also give you more specific instructions. If you have problems or questions, contact your health care provider. What can I expect after the procedure? After the procedure, it is common to have: Soreness or discomfort near the incision. Some swelling or bruising near the incision.  Follow these instructions at home: Incision care  Monitor your cardiac device site for redness, swelling, and drainage. Call the device clinic at [phone] if you experience these symptoms or fever/chills.  Keep the large square bandage on your site for 24 hours and then you may remove it yourself. Keep the steri-strips underneath in place.   You may shower after 72 hours / 3 days from your procedure with the steri-strips in place. They will usually fall off on their own, or may be removed after 10 days. Pat dry.   Avoid lotions, ointments, or perfumes over your incision until it is well-healed.  Please do not submerge in water until your site is completely healed.   Your device is MRI compatible.   Remote monitoring is used to monitor your cardiac device from home. This monitoring is scheduled every month by our office. It allows [place] to keep an eye on the function of your device to ensure it is working properly.  If your wound site starts to bleed apply pressure.     For help with the monitor please call Medtronic Monitor Support Specialist directly at [phone].    If you have any questions/concerns please call the device clinic at [phone].  Activity  Return to your normal activities.  General instructions Follow instructions from your health care provider about how to manage your implantable loop recorder and transmit the information. Learn how to activate a recording if this is necessary for your type of device. You may go through a metal detection gate, and you may let someone hold a metal detector over your chest. Show your ID card if needed. Do not have an MRI unless you check with your health care provider first. Take over-the-counter and prescription medicines only as told by your health care provider. Keep all follow-up visits as told by your health care provider. This is important. Contact a health care provider if: You have redness, swelling, or pain around your incision. You have a fever. You have pain that is not relieved by your pain medicine. You have triggered your device because of fainting (syncope) or because of a heartbeat that feels like it is racing, slow, fluttering, or skipping (palpitations). Get help right away if you have: Chest pain. Difficulty breathing. Summary After the procedure, it is common to have soreness or discomfort near the incision. Change your dressing as told by your health care provider. Follow instructions from your health care provider about how to manage your implantable loop recorder and transmit the information. Keep all follow-up visits as told by your health care provider. This is important. This information is  not intended to replace advice given to you by your health care provider. Make sure you discuss any questions you have with your health care provider. Document Released: 01/15/2015 Document Revised: 03/21/2017 Document Reviewed: 03/21/2017 Elsevier Patient Education  2020 [employer].

## 2022-04-17 DIAGNOSIS — G4733 Obstructive sleep apnea (adult) (pediatric): Secondary | ICD-10-CM | POA: Diagnosis not present

## 2022-04-29 ENCOUNTER — Other Ambulatory Visit: Payer: Self-pay | Admitting: Cardiology

## 2022-05-17 DIAGNOSIS — G4733 Obstructive sleep apnea (adult) (pediatric): Secondary | ICD-10-CM | POA: Diagnosis not present

## 2022-05-19 ENCOUNTER — Ambulatory Visit (INDEPENDENT_AMBULATORY_CARE_PROVIDER_SITE_OTHER): Payer: 59

## 2022-05-19 DIAGNOSIS — I480 Paroxysmal atrial fibrillation: Secondary | ICD-10-CM | POA: Diagnosis not present

## 2022-05-20 LAB — CUP PACEART REMOTE DEVICE CHECK
Date Time Interrogation Session: 20240401100937
Implantable Pulse Generator Implant Date: 20240228

## 2022-05-26 ENCOUNTER — Encounter: Payer: Self-pay | Admitting: Cardiology

## 2022-05-26 ENCOUNTER — Telehealth: Payer: Self-pay | Admitting: Home Health

## 2022-05-26 NOTE — Telephone Encounter (Signed)
Patient called after hour line, states he felt he was in A fib since 05/21/22, has been having ongoing fatigue, left arm tinging sensation. HR has been elevated with exertion and anxiety, highest HR was 170s while working hard at work, HR currently is 89 at rest. 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 He continue to take cardizem 240mg  and PRN 30mg  when HR >100. He denied chest pain, SOB, dizziness, syncope. Advised the patient to go to ER if urgent symptoms such as above occur. Otherwise, message sent to triage for arranging an urgent appointment for him this week at [facility] office. Will forward the message to Dr Lalla Brothers.

## 2022-05-27 ENCOUNTER — Emergency Department
Admission: EM | Admit: 2022-05-27 | Discharge: 2022-05-27 | Disposition: A | Discharge status: Home / Self Care | Payer: 59 | Attending: Emergency Medicine | Admitting: Emergency Medicine

## 2022-05-27 ENCOUNTER — Emergency Department: Payer: 59

## 2022-05-27 ENCOUNTER — Telehealth: Payer: Self-pay | Admitting: Home Health

## 2022-05-27 ENCOUNTER — Telehealth: Payer: Self-pay

## 2022-05-27 DIAGNOSIS — M79605 Pain in left leg: Secondary | ICD-10-CM | POA: Diagnosis not present

## 2022-05-27 DIAGNOSIS — R202 Paresthesia of skin: Secondary | ICD-10-CM | POA: Insufficient documentation

## 2022-05-27 LAB — CBC
HCT: 47.8 % (ref 39.0–52.0)
Hemoglobin: 16.2 g/dL (ref 13.0–17.0)
MCH: 29.5 pg (ref 26.0–34.0)
MCHC: 33.9 g/dL (ref 30.0–36.0)
MCV: 86.9 fL (ref 80.0–100.0)
Platelets: 270 10*3/uL (ref 150–400)
RBC: 5.5 MIL/uL (ref 4.22–5.81)
RDW: 12 % (ref 11.5–15.5)
WBC: 7.6 10*3/uL (ref 4.0–10.5)
nRBC: 0 % (ref 0.0–0.2)

## 2022-05-27 LAB — BASIC METABOLIC PANEL
Anion gap: 7 (ref 5–15)
BUN: 19 mg/dL (ref 6–20)
CO2: 25 mmol/L (ref 22–32)
Calcium: 9.4 mg/dL (ref 8.9–10.3)
Chloride: 104 mmol/L (ref 98–111)
Creatinine, Ser: 0.96 mg/dL (ref 0.61–1.24)
GFR, Estimated: >60 mL/min (ref >60)
Glucose, Bld: 100 mg/dL — ABNORMAL HIGH (ref 70–99)
Potassium: 3.8 mmol/L (ref 3.5–5.1)
Sodium: 136 mmol/L (ref 135–145)

## 2022-05-27 LAB — TROPONIN I (HIGH SENSITIVITY): Troponin I (High Sensitivity): <2 ng/L (ref <18)

## 2022-05-27 NOTE — Telephone Encounter (Signed)
Called the patient for follow up, he states he does not have persistent left arm numbness/tingling, it comes and goes, does not have it right now, is at work currently and felt fine. One concern of is if he has stroke symptoms, although based on history suspicion is low at this time. Advised the patient monitor himself for symptoms and head to ER for stroke evaluation if symptoms recurs. He also states he has upcoming appt with Dr Lalla Brothers on Thursday. He states he feels well currently and can't go to ER because he needs to work, but agreed to monitor symptoms.

## 2022-05-27 NOTE — ED Triage Notes (Signed)
Pt sts that he has afib and has been having left arm weakness with tingling. 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 Pt does endorse that he has a loop recorder device. Pt sts that his pcp wants him to have a scan to make sure he does not have a blood clot in his arm.

## 2022-05-27 NOTE — ED Notes (Signed)
US at bedside

## 2022-05-27 NOTE — ED Notes (Signed)
Pt given discharge instructions, pt voiced understanding of discharge instructions.

## 2022-05-27 NOTE — ED Notes (Addendum)
Pt reports "intermittent arrhythmia" and L arm tingling x6 days.  Denies weakness.  Pt reports he thought he had A Fib and asked a coworker w/ EMS to put him on a 12 lead.  Sts the 12 lead "did not show A Fib, but did show an arrhythmia."    Pt reports he works for [employer] and his bosses want him to get a CT of his arm.

## 2022-05-27 NOTE — Discharge Instructions (Addendum)
Your exam, labs, and [place] are normal reassuring.  No evidence of a DVT (blood clot) to the upper extremity.  Your symptoms may be due to nerve irritation from the rotator cuff of the cervical spine.  Follow-up with primary provider or specialist as discussed.  Continue with the previously prescribed cardiac medicines.

## 2022-05-27 NOTE — ED Provider Notes (Signed)
[facility] Emergency Department Provider Note     Event Date/Time   First MD Initiated Contact with Patient 05/27/22 1204     (approximate)   History   Palpitations   HPI  Allen Wilson is a 27 y.o. 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  Patient presents with 2 to 3 days of intermittent left upper extremity paresthesias.  He denies any pain, paralysis, or weakness.  He also denies any skin changes, swelling, ecchymosis, or bruising.  No reports of any recent injury or trauma.  He was advised by the on-call cardiologist to be evaluated in the ED for possible DVT and extremities secondary to his lack of anticoagulation.  Patient is under the care of of cardiology, and has an implanted cardiac recorder in place until August.  He scheduled to see his cardiologist on Thursday for reevaluation.  Denies any cough, congestion, chest pain, or shortness of breath.   Physical Exam   Triage Vital Signs: ED Triage Vitals  Enc Vitals Group     BP 05/27/22 1227 136/89     Pulse Rate 05/27/22 1227 69     Resp 05/27/22 1227 16     Temp 05/27/22 1227 98.5 F (36.9 C)     Temp Source 05/27/22 1227 Oral     SpO2 05/27/22 1227 96 %     Weight 05/27/22 1133 250 lb (113.4 kg)     Height --      Head Circumference --      Peak Flow --      Pain Score 05/27/22 1133 0     Pain Loc --      Pain Edu? --      Excl. in GC? --     Most recent vital signs: Vitals:   05/27/22 1230 05/27/22 1300  BP: 134/84 129/83  Pulse: 87 (!) 57  Resp: 16 16  Temp:    SpO2: 97% 95%    General Awake, no distress.  HEENT NCAT. PERRL. EOMI. No rhinorrhea. Mucous membranes are moist.  CV:  Good peripheral perfusion.  No CCE distally. RESP:  Normal effort.  ABD:  No distention.  MSK:  Normal composite fist on the left hand.  Normal full active range of motion noted.  ED Results / Procedures / Treatments   Labs (all labs  ordered are listed, but only abnormal results are displayed) Labs Reviewed  BASIC METABOLIC PANEL - Abnormal; Notable for the following components:      Result Value   Glucose, Bld 100 (*)    All other components within normal limits  CBC  TROPONIN I (HIGH SENSITIVITY)   EKG  Vent. rate 86 BPM PR interval 148 ms QRS duration 86 ms QT/QTcB 352/421 ms P-R-T axes 54 81 1 Sinus rhythm with marked sinus arrhythmia T wave abnormality, consider inferior ischemia Abnormal ECG  RADIOLOGY  I personally viewed and evaluated these images as part of my medical decision making, as well as reviewing the written report by the radiologist.  ED Provider Interpretation: no acute findings  US Venous Img Upper Uni Left  Result Date: 05/27/2022 CLINICAL DATA:  Pain and tingling EXAM: Left UPPER EXTREMITY VENOUS DOPPLER ULTRASOUND TECHNIQUE: Gray-scale sonography with graded compression, as well as color Doppler and duplex ultrasound were performed to evaluate the upper extremity deep venous system from the level of the subclavian vein and including the jugular, axillary, basilic, radial, ulnar and upper cephalic  vein. Spectral Doppler was utilized to evaluate flow at rest and with distal augmentation maneuvers. COMPARISON:  None Available. FINDINGS: Contralateral Subclavian Vein: Respiratory phasicity is normal and symmetric with the symptomatic side. No evidence of thrombus. Normal compressibility. Internal Jugular Vein: No evidence of thrombus. Normal compressibility, respiratory phasicity and response to augmentation. Subclavian Vein: No evidence of thrombus. Normal compressibility, respiratory phasicity and response to augmentation. Axillary Vein: No evidence of thrombus. Normal compressibility, respiratory phasicity and response to augmentation. Cephalic Vein: No evidence of thrombus. Normal compressibility, respiratory phasicity and response to augmentation. Basilic Vein: No evidence of thrombus. Normal  compressibility, respiratory phasicity and response to augmentation. Brachial Veins: No evidence of thrombus. Normal compressibility, respiratory phasicity and response to augmentation. Radial Veins: No evidence of thrombus. Normal compressibility, respiratory phasicity and response to augmentation. Ulnar Veins: No evidence of thrombus. Normal compressibility, respiratory phasicity and response to augmentation. Venous Reflux:  None visualized. Other Findings:  None visualized. IMPRESSION: No evidence of DVT within the left upper extremity. Electronically Signed   By: Ernie Avena M.D.   On: 05/27/2022 14:58     PROCEDURES:  Critical Care performed: No  Procedures   MEDICATIONS ORDERED IN ED: Medications - No data to display   IMPRESSION / MDM / ASSESSMENT AND PLAN / ED COURSE  I reviewed the triage vital signs and the nursing notes.                              Differential diagnosis includes, but is not limited to, DVT, paresthesia, cellulitis, MSK etiology  Patient's presentation is most consistent with acute complicated illness / injury requiring diagnostic workup.  Patient to the ED for evaluation of intermittent left arm paresthesias, concerning for DVT since he is not actively coagulated for A-fib history.  His exam and labs are reassuring at this time but no signs of any arrhythmia on his EKG.  Patient's DVT study is negative for any acute occlusive process, based on interpretation.  Patient's diagnosis is consistent with LUE paresthesias of unclear etiology. Patient is to follow up with his cardiologist as scheduled, as needed or otherwise directed. Patient is given ED precautions to return to the ED for any worsening or new symptoms.  FINAL CLINICAL IMPRESSION(S) / ED DIAGNOSES   Final diagnoses:  Paresthesia of arm     Rx / DC Orders   ED Discharge Orders     None        Note:  This document was prepared using Dragon voice recognition software and may  include unintentional dictation errors.    Lissa Hoard, PA-C 05/27/22 1521    Jene Every, MD 05/27/22 1535

## 2022-05-28 NOTE — Telephone Encounter (Signed)
Pt called to inform clinic of ED visit and will follow up with cardiology this week prior to return to work.

## 2022-05-28 NOTE — Progress Notes (Unsigned)
Cardiology Office Note Date:  05/29/2022  Patient ID:  Allen Wilson, Allen Wilson 12-Feb-1996, MRN [id number] PCP:  Joni Reining, PA-C  Cardiologist:  None Electrophysiologist: Lanier Prude, MD    Chief Complaint: palpitations, AFib  History of Present Illness: Allen Wilson is a 27 y.o. male with PMH notable for parox Afib, tachyarrhythmia, obesity, anxiety; seen today for Lanier Prude, MD for acute visit due to increased palpitations.   Previously, was diagnosed with AF 07/2021. He felt 'off' while at work at [employer], EMTs placed him on monitor and found him in an "svt" at 226bpm. Vagal maneuvers unsuccessful. Proceeded to ER where adenosine x3 was given. Adenosine finally slowed the rhythm and he transitioned to Afib. He was given dilt bolus with gtt and converted to NSR. He was started on 240 dilt daily + PRN. Not on OAC d/t low ChadsVasc of 0 Subsequently did well without palpation episode until 03/2022, became lightheaded while at work, EKG while at work found to have Afib. By the time he presented to ER, he was back in NSR. ILR was recommended at that time to further quantify afib / svt burden.   He called on-call cardiology (NP Dion Body) earlier this week c/o increased palpitations, fatigue, L arm tingling. HR was in 170s for awhile. He proceeded to [facility] ER 4/9 concerned he had DVT in L arm d/t ongoing tingling, palpitations and high concern for Afib. He was found to be in NSR and without DVT.  Today, he presents for further evaluation.  He feels well today in office, no palpitations. He is diligent about taking daily diltiazem, has only taken PRN 3 times d/t HR not meeting > 100 criteria.  His PCP considered phentermine for weight loss, but this was not started. He does continue to chew tobacco and drink etoh. Has noticed some correlation between palpitations and increased ETOH use.   He gets labwork yearly at [facility], scheduled this month.   During  episodes, he denies CP, chest pressure, diaphoresis. Rarely has lightheadedness.  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    Device Information: MDT ILR, imp 03/2022; arrhythmia   Past Medical History:  Diagnosis Date   Tachycardia     Past Surgical History:  Procedure Laterality Date   TYMPANOSTOMY TUBE PLACEMENT     childhood    Current Outpatient Medications  Medication Instructions   ALPRAZolam (XANAX) 0.25 mg, Oral, Daily PRN   diltiazem (CARDIZEM CD) 300 mg, Oral, Daily   diltiazem (CARDIZEM) 30 MG tablet TAKE ONE TABLET BY MOUTH TWICE DAILY AS NEEDED (HR>100)    Social History:  The patient  reports that he has never smoked. His smokeless tobacco use includes chew. He reports current alcohol use of about 5.0 standard drinks of alcohol per week. He reports that he does not use drugs.   Family History:  The patient's family history includes Arthritis/Rheumatoid in his mother; Heart attack in his maternal grandfather; Hypertension in his father; Other in his mother.  ROS:  Please see the history of present illness. All other systems are reviewed and otherwise negative.   PHYSICAL EXAM:  VS:  BP (!) 140/82 (BP Location: Left Arm, Patient Position: Sitting, Cuff Size: Normal)   Pulse 77   Ht 6\' 1"  (1.854 m)   Wt 255 lb (115.7 kg)   SpO2 96%   BMI 33.64 kg/m  BMI: Body mass  index is 33.64 kg/m.  GEN- The patient is well appearing, alert and oriented x 3 today.   Lungs- Clear to ausculation bilaterally, normal work of breathing.  Heart- Regular rate and rhythm, no murmurs, rubs or gallops Extremities- No peripheral edema, warm, dry Skin-  ILR insertion site well-healed    Device interrogation done today and reviewed by myself:  Battery good R-wave 0.67mV 3 symptom episodes - all NSR 0% AF burden   EKG is ordered. Personal review of EKG from today shows:  Sinus arrhythmia, rate  77bpm   05/27/22 at 11:34 - irregular sinus rhythm;  consistent PR interval;  Narrow QRS    Recent Labs: 06/05/2021: ALT 48 08/08/2021: Magnesium 2.2; TSH 2.061 05/27/2022: BUN 19; Creatinine, Ser 0.96; Hemoglobin 16.2; Platelets 270; Potassium 3.8; Sodium 136  06/05/2021: Chol/HDL Ratio 5.1; Cholesterol, Total 194; HDL 38; LDL Chol Calc (NIH) 123; Triglycerides 187   Estimated Creatinine Clearance: 156.7 mL/min (by C-G formula based on SCr of 0.96 mg/dL).   Wt Readings from Last 3 Encounters:  05/29/22 255 lb (115.7 kg)  05/27/22 250 lb (113.4 kg)  04/16/22 254 lb 9.6 oz (115.5 kg)     Additional studies reviewed include: Previous EP, cardiology notes.   Long term monitor, 10/16/2021 HR 39 - 180 bpm, average 78 bpm. Rare supraventricular and ventricular ectopy. No atrial fibrillation detected.  ASSESSMENT AND PLAN:  #) parox Afib #) tachyarrhythmia #) anxiety S/p ILR insertion 0% AF burden by device Does have sinus arrhythmia on EKG We discussed ongoing monitoring with ILR and to press symptom button on phone when having palpitation episodes to better correlate symptoms to rhythm Discuss with PCP about daily anti-anxiety medication Will increase cardizem CD to 300mg  daily + PRN - lab work scheduled at [place] on 4/22; will follow results Cont to check BP a couple days a week at work CHA2DS2-VASc Score = 0   #) Daytime sleepiness  snoring  concern for sleep apnea STOP-bang score of 3, consider sleep eval at follow-up  #) Lifestyle modifications Discussed link between nicotine use, ETOH use, weight with Afib / cardiac arrhythmias Encouraged lifestyle modifications   Current medicines are reviewed at length with the patient today.   The patient does not have concerns regarding his medicines.  The following changes were made today:   INCREASE cardizem CD 300mg  daily  Labs/ tests ordered today include:  Orders Placed This Encounter  Procedures   EKG 12-Lead      Disposition: Follow up with EP APP in 2 months   Signed, Sherie Don, NP  05/29/22  12:05 PM  Electrophysiology [facility]

## 2022-05-29 ENCOUNTER — Ambulatory Visit: Payer: 59 | Attending: Cardiology | Admitting: Cardiology

## 2022-05-29 ENCOUNTER — Encounter: Payer: Self-pay | Admitting: Cardiology

## 2022-05-29 VITALS — BP 140/82 | HR 77 | Ht 73.0 in | Wt 255.0 lb

## 2022-05-29 DIAGNOSIS — R000 Tachycardia, unspecified: Secondary | ICD-10-CM | POA: Diagnosis not present

## 2022-05-29 DIAGNOSIS — I480 Paroxysmal atrial fibrillation: Secondary | ICD-10-CM | POA: Diagnosis not present

## 2022-05-29 DIAGNOSIS — F419 Anxiety disorder, unspecified: Secondary | ICD-10-CM | POA: Diagnosis not present

## 2022-05-29 MED ORDER — DILTIAZEM HCL ER COATED BEADS 300 MG PO CP24: 300.0000 mg | ORAL_CAPSULE | Freq: Every day | ORAL | 1 refills | Status: DC

## 2022-05-29 NOTE — Patient Instructions (Addendum)
Medication Instructions:  INCREASE diltiazem (Cardizem CD) to 300 mg by mouth daily  *If you need a refill on your cardiac medications before your next appointment, please call your pharmacy*  Lab Work: No labs ordered  If you have labs (blood work) drawn today and your tests are completely normal, you will receive your results only by: MyChart Message (if you have MyChart) OR A paper copy in the mail If you have any lab test that is abnormal or we need to change your treatment, we will call you to review the results.   Testing/Procedures: No testing ordered  Follow-Up: At [facility], you and your health needs are our priority.  As part of our continuing mission to provide you with exceptional heart care, we have created designated Provider Care Teams.  These Care Teams include your primary Cardiologist (physician) and Advanced Practice Providers (APPs -  Physician Assistants and Nurse Practitioners) who all work together to provide you with the care you need, when you need it.  We recommend signing up for the patient portal called "MyChart".  Sign up information is provided on this After Visit Summary.  MyChart is used to connect with patients for Virtual Visits (Telemedicine).  Patients are able to view lab/test results, encounter notes, upcoming appointments, etc.  Non-urgent messages can be sent to your provider as well.   To learn more about what you can do with MyChart, go to [email or web address].    Your next appointment:   2 month(s)  Provider:   Sherie Don, NP

## 2022-06-09 ENCOUNTER — Ambulatory Visit: Payer: Self-pay

## 2022-06-09 DIAGNOSIS — Z0289 Encounter for other administrative examinations: Secondary | ICD-10-CM

## 2022-06-09 LAB — POCT URINALYSIS DIPSTICK
Bilirubin, UA: NEGATIVE
Blood, UA: NEGATIVE
Glucose, UA: NEGATIVE
Ketones, UA: NEGATIVE
Leukocytes, UA: NEGATIVE
Nitrite, UA: NEGATIVE
Protein, UA: NEGATIVE
Spec Grav, UA: 1.01 (ref 1.010–1.025)
Urobilinogen, UA: 0.2 E.U./dL
pH, UA: 7.5 (ref 5.0–8.0)

## 2022-06-09 NOTE — Progress Notes (Signed)
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   

## 2022-06-10 LAB — CMP12+LP+TP+TSH+6AC+PSA+CBC…
ALT: 39 IU/L (ref 0–44)
AST: 22 IU/L (ref 0–40)
Albumin/Globulin Ratio: 1.5 (ref 1.2–2.2)
Albumin: 4.7 g/dL (ref 4.3–5.2)
Alkaline Phosphatase: 81 IU/L (ref 44–121)
BUN/Creatinine Ratio: 19 (ref 9–20)
BUN: 18 mg/dL (ref 6–20)
Basophils Absolute: 0 10*3/uL (ref 0.0–0.2)
Basos: 1 %
Bilirubin Total: 0.3 mg/dL (ref 0.0–1.2)
Calcium: 9.7 mg/dL (ref 8.7–10.2)
Chloride: 100 mmol/L (ref 96–106)
Chol/HDL Ratio: 5.7 ratio — ABNORMAL HIGH (ref 0.0–5.0)
Cholesterol, Total: 212 mg/dL — ABNORMAL HIGH (ref 100–199)
Creatinine, Ser: 0.97 mg/dL (ref 0.76–1.27)
EOS (ABSOLUTE): 0 10*3/uL (ref 0.0–0.4)
Eos: 1 %
Estimated CHD Risk: 1.2 times avg. — ABNORMAL HIGH (ref 0.0–1.0)
Free Thyroxine Index: 1.8 (ref 1.2–4.9)
GGT: 30 IU/L (ref 0–65)
Globulin, Total: 3.1 g/dL (ref 1.5–4.5)
Glucose: 87 mg/dL (ref 70–99)
HDL: 37 mg/dL — ABNORMAL LOW (ref >39)
Hematocrit: 49.1 % (ref 37.5–51.0)
Hemoglobin: 16.6 g/dL (ref 13.0–17.7)
Immature Grans (Abs): 0 10*3/uL (ref 0.0–0.1)
Immature Granulocytes: 1 %
Iron: 141 ug/dL (ref 38–169)
LDH: 134 IU/L (ref 121–224)
LDL Chol Calc (NIH): 122 mg/dL — ABNORMAL HIGH (ref 0–99)
Lymphocytes Absolute: 2.2 10*3/uL (ref 0.7–3.1)
Lymphs: 36 %
MCH: 30 pg (ref 26.6–33.0)
MCHC: 33.8 g/dL (ref 31.5–35.7)
MCV: 89 fL (ref 79–97)
Monocytes Absolute: 0.4 10*3/uL (ref 0.1–0.9)
Monocytes: 7 %
Neutrophils Absolute: 3.4 10*3/uL (ref 1.4–7.0)
Neutrophils: 54 %
Phosphorus: 2.8 mg/dL (ref 2.8–4.1)
Platelets: 259 10*3/uL (ref 150–450)
Potassium: 4 mmol/L (ref 3.5–5.2)
Prostate Specific Ag, Serum: 0.7 ng/mL (ref 0.0–4.0)
RBC: 5.53 x10E6/uL (ref 4.14–5.80)
RDW: 12.6 % (ref 11.6–15.4)
Sodium: 138 mmol/L (ref 134–144)
T3 Uptake Ratio: 27 % (ref 24–39)
T4, Total: 6.6 ug/dL (ref 4.5–12.0)
TSH: 1.65 u[IU]/mL (ref 0.450–4.500)
Total Protein: 7.8 g/dL (ref 6.0–8.5)
Triglycerides: 298 mg/dL — ABNORMAL HIGH (ref 0–149)
Uric Acid: 9.3 mg/dL — ABNORMAL HIGH (ref 3.8–8.4)
VLDL Cholesterol Cal: 53 mg/dL — ABNORMAL HIGH (ref 5–40)
WBC: 6.1 10*3/uL (ref 3.4–10.8)
eGFR: 110 mL/min/{1.73_m2} (ref >59)

## 2022-06-12 ENCOUNTER — Encounter: Payer: Self-pay | Admitting: Physician Assistant

## 2022-06-12 ENCOUNTER — Ambulatory Visit: Payer: Self-pay | Admitting: Physician Assistant

## 2022-06-12 VITALS — BP 128/90 | HR 76 | Temp 97.8°F | Resp 12 | Ht 73.0 in | Wt 250.0 lb

## 2022-06-12 DIAGNOSIS — I480 Paroxysmal atrial fibrillation: Secondary | ICD-10-CM

## 2022-06-12 DIAGNOSIS — E782 Mixed hyperlipidemia: Secondary | ICD-10-CM

## 2022-06-12 DIAGNOSIS — Z0289 Encounter for other administrative examinations: Secondary | ICD-10-CM

## 2022-06-12 NOTE — Progress Notes (Signed)
Pt presents today to complete annual physical. Pt has questions on medication refill.

## 2022-06-12 NOTE — Progress Notes (Signed)
[facility]  ____________________________________________   None    (approximate)  I have reviewed the triage vital signs and the nursing notes.   HISTORY  Chief Complaint No chief complaint on file.  HPI Allen Wilson is a 26 y.o. male patient presents for annual physical exam.  Patient is followed by cardiologist for tachycardia.        Past Medical History:  Diagnosis Date   Tachycardia     Patient Active Problem List   Diagnosis Date Noted   Paroxysmal atrial fibrillation 08/12/2021   Cellulitis of left forearm 02/11/2020   History of depression 02/11/2020   Sepsis 02/11/2020   Cellulitis of left arm 02/11/2020   Severe major depression, single episode 02/16/2018   Poisoning by anticholinergic drug, intentional self-harm, initial encounter 02/15/2018   Tachyarrhythmia 02/15/2018   Acute psychosis (HCC)    Drug overdose 09/17/2016    Past Surgical History:  Procedure Laterality Date   TYMPANOSTOMY TUBE PLACEMENT     childhood    Prior to Admission medications   Medication Sig Start Date End Date Taking? Authorizing Provider  ALPRAZolam (XANAX) 0.25 MG tablet Take 0.25 mg by mouth daily as needed. 05/15/21   [provider]  diltiazem (CARDIZEM CD) 300 MG 24 hr capsule Take 1 capsule (300 mg total) by mouth daily. 05/29/22   Sherie Don, NP  diltiazem (CARDIZEM) 30 MG tablet TAKE ONE TABLET BY MOUTH TWICE DAILY AS NEEDED (HR>100) 04/29/22   Lanier Prude, MD    Allergies Patient has no known allergies.  Family History  Problem Relation Age of Onset   Other Mother        alive & well.   Arthritis/Rheumatoid Mother    Hypertension Father    Heart attack Maternal Grandfather        x 2 - first in his 33's.    Social History Social History   Tobacco Use   Smoking status: Never   Smokeless tobacco: Current    Types: Chew  Vaping Use   Vaping Use: Never used  Substance Use Topics    Alcohol use: Yes    Alcohol/week: 5.0 standard drinks of alcohol    Types: 5 Cans of beer per week   Drug use: No    Review of Systems Constitutional: No fever/chills Eyes: No visual changes. ENT: No sore throat. Cardiovascular: Denies chest pain.  A-fib Respiratory: Denies shortness of breath. Gastrointestinal: No abdominal pain.  No nausea, no vomiting.  No diarrhea.  No constipation. Genitourinary: Negative for dysuria. Musculoskeletal: Negative for back pain. Skin: Negative for rash. Neurological: Negative for headaches, focal weakness or numbness. Psychiatric: Anxiety and depression  ____________________________________________   PHYSICAL EXAM:  VITAL SIGNS: BP 128/90  Pulse 76  Resp 12  Temp 97.8 F (36.6 C)  Temp src Temporal  SpO2 98 %  Weight 250 lb (113.4 kg)  Height 6\' 1"  (1.854 m)   BMI 32.98 kg/m2  BSA 2.42 m2  Constitutional: Alert and oriented. Well appearing and in no acute distress. Eyes: Conjunctivae are normal. PERRL. EOMI. Head: Atraumatic. Nose: No congestion/rhinnorhea. Mouth/Throat: Mucous membranes are moist.  Oropharynx non-erythematous. Neck: No stridor. No cervical spine tenderness to palpation. Hematological/Lymphatic/Immunilogical: No cervical lymphadenopathy. Cardiovascular: Normal rate, regular rhythm. Grossly normal heart sounds.  Good peripheral circulation. Respiratory: Normal respiratory effort.  No retractions. Lungs CTAB. Gastrointestinal: Soft and nontender. No distention. No abdominal bruits. No CVA tenderness. Genitourinary: Deferred Musculoskeletal: No lower extremity tenderness nor edema.  No joint effusions. Neurologic:  Normal speech and language. No gross focal neurologic deficits are appreciated. No gait instability. Skin:  Skin is warm, dry and intact. No rash noted. Psychiatric: Mood and affect are normal. Speech and behavior are normal.  ____________________________________________   LABS            Component Ref Range & Units 3 d ago (06/09/22) 4 mo ago (01/23/22) 1 yr ago (06/11/21) 1 yr ago (06/05/21) 2 yr ago (06/11/20) 3 yr ago (05/23/19) 3 yr ago (12/08/18)  Color, UA yellow Dark Yellow yellow yellow yellow Yellow yellow  Clarity, UA clear Clear clear cloudy clear Clear cloudy  Glucose, UA Negative Negative Negative Negative Negative Negative Negative Negative  Bilirubin, UA neg Negative negative negative Negative Negative neg  Ketones, UA neg Negative negative negative Negative Negative neg  Spec Grav, UA 1.010 - 1.025 1.010 1.025 1.010 1.015 1.020 1.020 >=1.030 Abnormal  CM  Blood, UA neg Negative negative negative Negative Negative neg  pH, UA 5.0 - 8.0 7.5 6.0 7.5 8.0 6.5 7.0 6.0  Protein, UA Negative Negative Positive Abnormal  CM Negative Positive Abnormal  CM Positive Abnormal  CM Positive Abnormal  CM Negative  Urobilinogen, UA 0.2 or 1.0 E.U./dL 0.2 1.0 0.2 0.2 0.2 0.2 0.2  Nitrite, UA neg Negative negative negative Negative Negative neg  Leukocytes, UA Negative Negative Negative Negative Negative Negative Negative Negative  Appearance     Normal    Odor     None           Specimen Collected: 06/09/22 09:40 Last Resulted: 06/09/22 09:40      Lab Flowsheet      Order Details      View Encounter      Lab and Collection Details      Routing      Result History    View All Conversations on this Encounter      CM=Additional comments      Result Care Coordination   Patient Communication   Add Comments   Add Notifications  Back to Top      Other Results from 06/09/2022   Contains abnormal data CMP12+LP+TP+TSH+6AC+PSA+CBC. Order: [id number] Status: Final result      Visible to patient: Yes (not seen)      Next appt: 06/19/2022 at 12:55 PM in Cardiology ([facility] Device Remotes)      Dx: Encounter for physical examination re...    0 Result Notes             Component Ref Range & Units 3 d ago (06/09/22) 2 wk ago (05/27/22) 2 wk  ago (05/27/22) 2 mo ago (03/25/22) 2 mo ago (03/25/22) 10 mo ago (08/08/21) 10 mo ago (08/08/21) 10 mo ago (08/08/21)  Glucose 70 - 99 mg/dL 87 952 High  CM  98 CM   112 High  CM   Uric Acid 3.8 - 8.4 mg/dL 9.3 High          Comment:            Therapeutic target for gout patients: <6.0  BUN 6 - 20 mg/dL Creatinine, Ser 0.76 - 1.27 mg/dL 8.41 3.24 R  4.01 R   0.27 R   eGFR >59 mL/min/1.73 110         BUN/Creatinine Ratio 9 - 20 19         Sodium 134 - 144 mmol/L 138 136 R  138 R  139 R   Potassium 3.5 - 5.2 mmol/L 4.0 3.8 R  3.5 R   3.7 R   Chloride 96 - 106 mmol/L 100 104 R  101 R   105 R   Calcium 8.7 - 10.2 mg/dL 9.7 9.4 R  9.7 R   9.9 R   Phosphorus 2.8 - 4.1 mg/dL 2.8         Total Protein 6.0 - 8.5 g/dL 7.8         Albumin 4.3 - 5.2 g/dL 4.7         Globulin, Total 1.5 - 4.5 g/dL 3.1         Albumin/Globulin Ratio 1.2 - 2.2 1.5         Bilirubin Total 0.0 - 1.2 mg/dL 0.3         Alkaline Phosphatase 44 - 121 IU/L 81         LDH 121 - 224 IU/L 134         AST 0 - 40 IU/L 22         ALT 0 - 44 IU/L 39         GGT 0 - 65 IU/L 30         Iron 38 - 169 ug/dL 161         Cholesterol, Total 100 - 199 mg/dL 096 High          Triglycerides 0 - 149 mg/dL 045 High          HDL >40 mg/dL 37 Low          VLDL Cholesterol Cal 5 - 40 mg/dL 53 High          LDL Chol Calc (NIH) 0 - 99 mg/dL 981 High          Chol/HDL Ratio 0.0 - 5.0 ratio 5.7 High          Comment:                                   T. Chol/HDL Ratio                                             Men  Women                               1/2 Avg.Risk  3.4    3.3                                   Avg.Risk  5.0    4.4                                2X Avg.Risk  9.6    7.1                                3X Avg.Risk 23.4   11.0  Estimated CHD Risk 0.0 - 1.0 times avg. 1.2 High          Comment: The CHD Risk is based on the T. Chol/HDL ratio. Other  factors affect CHD Risk such as  hypertension, smoking, diabetes, severe obesity, and family history of premature CHD.  TSH 0.450 - 4.500 uIU/mL 1.650     2.061 R, CM    T4, Total 4.5 - 12.0 ug/dL 6.6         T3 Uptake Ratio 24 - 39 % 27         Free Thyroxine Index 1.2 - 4.9 1.8         Prostate Specific Ag, Serum 0.0 - 4.0 ng/mL 0.7         Comment: Roche ECLIA methodology. According to the American Urological Association, Serum PSA should decrease and remain at undetectable levels after radical prostatectomy. The AUA defines biochemical recurrence as an initial PSA value 0.2 ng/mL or greater followed by a subsequent confirmatory PSA value 0.2 ng/mL or greater. Values obtained with different assay methods or kits cannot be used interchangeably. Results cannot be interpreted as absolute evidence of the presence or absence of malignant disease.  WBC 3.4 - 10.8 x10E3/uL 6.1  7.6 R  10.2 R   11.3 High  R  RBC 4.14 - 5.80 x10E6/uL 5.53  5.50 R  5.60 R   6.01 High  R  Hemoglobin 13.0 - 17.7 g/dL 13.0  86.5 R  78.4 R   17.2 High  R  Hematocrit 37.5 - 51.0 % 49.1  47.8 R  47.7 R   52.3 High  R  MCV 79 - 97 fL 89  86.9 R  85.2 R   87.0 R  MCH 26.6 - 33.0 pg 30.0  29.5 R  29.1 R   28.6 R  MCHC 31.5 - 35.7 g/dL 69.6  29.5 R  28.4 R   32.9 R  RDW 11.6 - 15.4 % 12.6  12.0 R  12.0 R   12.0 R  Platelets 150 - 450 x10E3/uL 259  270 R  282 R   276 R  Neutrophils Not Estab. % 54         Lymphs Not Estab. % 36         Monocytes Not Estab. % 7         Eos Not Estab. % 1         Basos Not Estab. % 1         Neutrophils Absolute 1.4 - 7.0 x10E3/uL 3.4         Lymphocytes Absolute 0.7 - 3.1 x10E3/uL 2.2         Monocytes Absolute 0.1 - 0.9 x10E3/uL 0.4         EOS (ABSOLUTE) 0.0 - 0.4 x10E3/uL 0.0         Basophils Absolute 0.0 - 0.2 x10E3/uL 0.0         Immature Granulocytes Not Estab. % 1         Immature Grans (Abs)              ____________________________________________  EKG  Sinus bradycardia  with occasional PAC ____________________________________________    ____________________________________________   INITIAL IMPRESSION / ASSESSMENT AND PLAN   As part of my medical decision making, I reviewed the following data within the electronic MEDICAL RECORD NUMBER   No acute final physical exam.  Discussed patient lab results showing elevated cholesterol and triglycerides.  Patient is amenable to starting a statin.  Patient to follow-up in 3 months with fasting lipid profile.             ____________________________________________   FINAL CLINICAL  IMPRESSION Well exam   ED Discharge Orders     None        Note:  This document was prepared using Dragon voice recognition software and may include unintentional dictation errors.

## 2022-06-17 DIAGNOSIS — G4733 Obstructive sleep apnea (adult) (pediatric): Secondary | ICD-10-CM | POA: Diagnosis not present

## 2022-06-19 ENCOUNTER — Ambulatory Visit (INDEPENDENT_AMBULATORY_CARE_PROVIDER_SITE_OTHER): Payer: 59

## 2022-06-19 DIAGNOSIS — R000 Tachycardia, unspecified: Secondary | ICD-10-CM | POA: Diagnosis not present

## 2022-06-23 LAB — CUP PACEART REMOTE DEVICE CHECK
Date Time Interrogation Session: 20240504100632
Implantable Pulse Generator Implant Date: 20240228

## 2022-06-26 NOTE — Progress Notes (Signed)
[facility] Loop Recorder 

## 2022-07-09 NOTE — Progress Notes (Signed)
Carelink Summary Report / Loop Recorder 

## 2022-07-17 DIAGNOSIS — G4733 Obstructive sleep apnea (adult) (pediatric): Secondary | ICD-10-CM | POA: Diagnosis not present

## 2022-07-21 ENCOUNTER — Ambulatory Visit (INDEPENDENT_AMBULATORY_CARE_PROVIDER_SITE_OTHER): Payer: 59

## 2022-07-21 DIAGNOSIS — I480 Paroxysmal atrial fibrillation: Secondary | ICD-10-CM

## 2022-07-21 LAB — CUP PACEART REMOTE DEVICE CHECK
Date Time Interrogation Session: 20240602230224
Implantable Pulse Generator Implant Date: 20240228

## 2022-07-29 NOTE — Progress Notes (Unsigned)
Cardiology Office Note Date:  07/30/2022  Patient ID:  Allen Wilson, Allen Wilson 1995/09/25, MRN [id number] PCP:  Joni Reining, PA-C  Cardiologist:  None Electrophysiologist: Lanier Prude, MD    Chief Complaint: palpitations, AFib  History of Present Illness: Allen Wilson is a 27 y.o. male with PMH notable for parox Afib, tachyarrhythmia, obesity, anxiety; seen today for Lanier Prude, MD for routine follow-up  He is status post ILR implant for further quantification of his SVT, A-fib I saw him about 2 months ago when he was having increased palpitations.  At that visit his diltiazem was increased to 300 mg daily, I also recommended he speak to his PCP regarding anxiety treatments.  We discussed lifestyle modifications including reduced nicotine consumption and EtOH use.   Today, he states that his palpitation burden is much better controlled since increasing his diltiazem dose. He has needed to take as needed Dilt twice, but this was right after our visit when the daily dose was increased.  He does continue to have palpitations the episodes are very brief and much more manageable than prior. He continues to use dip and drink alcohol.  He has considered weight loss and the lifestyle modifications as of yet. He is try to use CPAP machine without success.  He has already received a referral for a oral appliance  He denies chest pain, dyspnea, PND, orthopnea, nausea, vomiting, dizziness, syncope, edema, weight gain, or early satiety.    He has a 49-year-old daughter who started kindergarten in the fall.  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 His smokeless tobacco use includes chew. He reports current alcohol use of about 5.0 standard drinks of alcohol per week. He reports that he does not use drugs.   Family History:  The patient's family history includes Arthritis/Rheumatoid in his mother; Heart attack in his maternal grandfather; Hypertension in his father; Other in his mother; Stroke in his maternal grandmother.  ROS:  Please see the history of present illness. All other systems are reviewed and otherwise negative.   PHYSICAL EXAM:  VS:  BP 116/76 (BP Location: Left Arm, Patient Position: Sitting, Cuff Size: Large)   Pulse 75   Ht 6\' 1"  (1.854 m)   Wt 256 lb (116.1 kg)   SpO2 98%   BMI 33.78 kg/m  BMI: Body mass index is 33.78 kg/m.  GEN- The patient is well appearing, alert and oriented x 3 today.   Lungs- Clear to ausculation bilaterally, normal work of breathing.  Heart- Regular rate and rhythm, no murmurs, rubs or gallops Extremities- No peripheral edema, warm, dry Skin-  ILR insertion site well-healed   MDT Carelink reviewed by myself:  Battery good Several symptomatic episodes correspond to SVT   EKG is ordered. Personal review of EKG from today shows:  Sinus arrhythmia, rate 75bpm   Recent Labs: 08/08/2021: Magnesium 2.2 06/09/2022: ALT 39; BUN 18; Creatinine, Ser  0.97; Hemoglobin 16.6; Platelets 259; Potassium 4.0; Sodium 138; TSH 1.650  06/09/2022: Chol/HDL Ratio 5.7; Cholesterol, Total 212; HDL 37; LDL Chol Calc (NIH) 122; Triglycerides 298   CrCl cannot be calculated (Patient's most recent lab result is older than the maximum 21 days allowed.).   Wt Readings from Last 3 Encounters:  07/30/22 256 lb (116.1 kg)  06/12/22 250 lb (113.4 kg)  05/29/22 255 lb (115.7 kg)     Additional studies reviewed  include: Previous EP, cardiology notes.   Long term monitor, 10/16/2021 HR 39 - 180 bpm, average 78 bpm. Rare supraventricular and ventricular ectopy. No atrial fibrillation detected.  ASSESSMENT AND PLAN:  #) parox Afib #) tachyarrhythmia S/p ILR insertion 0% AF burden by device Symptomatic SVT on ILR Symptomatic burden has decreased with recent increase in diltiazem Continue 300 mg Cardizem daily plus PRN  #) Mild OSA Encouraged him to pursue oral appliance with dentistry for ongoing sleep apnea treatment    Current medicines are reviewed at length with the patient today.   The patient does not have concerns regarding his medicines.  The following changes were made today:   none  Labs/ tests ordered today include:  Orders Placed This Encounter  Procedures   EKG 12-Lead     Disposition: Follow up with EP APP in 6 months   Signed, Sherie Don, NP  07/30/22  10:52 AM  Electrophysiology [facility]

## 2022-07-30 ENCOUNTER — Encounter: Payer: Self-pay | Admitting: Cardiology

## 2022-07-30 ENCOUNTER — Ambulatory Visit: Payer: 59 | Attending: Cardiology | Admitting: Cardiology

## 2022-07-30 VITALS — BP 116/76 | HR 75 | Ht 73.0 in | Wt 256.0 lb

## 2022-07-30 DIAGNOSIS — R000 Tachycardia, unspecified: Secondary | ICD-10-CM

## 2022-07-30 DIAGNOSIS — G4733 Obstructive sleep apnea (adult) (pediatric): Secondary | ICD-10-CM | POA: Diagnosis not present

## 2022-07-30 DIAGNOSIS — I480 Paroxysmal atrial fibrillation: Secondary | ICD-10-CM | POA: Diagnosis not present

## 2022-07-30 NOTE — Patient Instructions (Signed)
Medication Instructions:  Your physician recommends that you continue on your current medications as directed. Please refer to the Current Medication list given to you today.  *If you need a refill on your cardiac medications before your next appointment, please call your pharmacy*  Lab Work: -None ordered  Testing/Procedures: -None ordered  Follow-Up: At [facility], you and your health needs are our priority.  As part of our continuing mission to provide you with exceptional heart care, we have created designated Provider Care Teams.  These Care Teams include your primary Cardiologist (physician) and Advanced Practice Providers (APPs -  Physician Assistants and Nurse Practitioners) who all work together to provide you with the care you need, when you need it.  Your next appointment:   10/08/2022 8:40 AM   Provider:   Lanier Prude, MD  Other Instructions -None

## 2022-08-02 ENCOUNTER — Other Ambulatory Visit: Payer: Self-pay | Admitting: Cardiology

## 2022-08-02 MED ORDER — DILTIAZEM HCL ER COATED BEADS 300 MG PO CP24: 300.0000 mg | ORAL_CAPSULE | Freq: Every day | ORAL | 1 refills | Status: DC

## 2022-08-12 NOTE — Progress Notes (Signed)
Carelink Summary Report / Loop Recorder 

## 2022-08-17 DIAGNOSIS — G4733 Obstructive sleep apnea (adult) (pediatric): Secondary | ICD-10-CM | POA: Diagnosis not present

## 2022-08-22 ENCOUNTER — Ambulatory Visit (INDEPENDENT_AMBULATORY_CARE_PROVIDER_SITE_OTHER): Payer: 59

## 2022-08-22 DIAGNOSIS — I480 Paroxysmal atrial fibrillation: Secondary | ICD-10-CM

## 2022-08-23 LAB — CUP PACEART REMOTE DEVICE CHECK
Date Time Interrogation Session: 20240705231028
Implantable Pulse Generator Implant Date: 20240228

## 2022-09-03 ENCOUNTER — Other Ambulatory Visit: Payer: Self-pay

## 2022-09-03 ENCOUNTER — Emergency Department: Payer: 59

## 2022-09-03 ENCOUNTER — Emergency Department
Admission: EM | Admit: 2022-09-03 | Discharge: 2022-09-03 | Disposition: A | Discharge status: Home / Self Care | Payer: 59 | Attending: Emergency Medicine | Admitting: Emergency Medicine

## 2022-09-03 DIAGNOSIS — R000 Tachycardia, unspecified: Secondary | ICD-10-CM | POA: Diagnosis not present

## 2022-09-03 DIAGNOSIS — R001 Bradycardia, unspecified: Secondary | ICD-10-CM | POA: Diagnosis not present

## 2022-09-03 DIAGNOSIS — I499 Cardiac arrhythmia, unspecified: Secondary | ICD-10-CM | POA: Diagnosis not present

## 2022-09-03 DIAGNOSIS — E876 Hypokalemia: Secondary | ICD-10-CM | POA: Diagnosis not present

## 2022-09-03 DIAGNOSIS — R202 Paresthesia of skin: Secondary | ICD-10-CM | POA: Diagnosis not present

## 2022-09-03 DIAGNOSIS — R0789 Other chest pain: Secondary | ICD-10-CM | POA: Diagnosis not present

## 2022-09-03 DIAGNOSIS — R002 Palpitations: Secondary | ICD-10-CM | POA: Diagnosis not present

## 2022-09-03 LAB — BASIC METABOLIC PANEL
Anion gap: 11 (ref 5–15)
BUN: 17 mg/dL (ref 6–20)
CO2: 22 mmol/L (ref 22–32)
Calcium: 9.7 mg/dL (ref 8.9–10.3)
Chloride: 103 mmol/L (ref 98–111)
Creatinine, Ser: 0.99 mg/dL (ref 0.61–1.24)
GFR, Estimated: >60 mL/min (ref >60)
Glucose, Bld: 128 mg/dL — ABNORMAL HIGH (ref 70–99)
Potassium: 2.7 mmol/L — CL (ref 3.5–5.1)
Sodium: 136 mmol/L (ref 135–145)

## 2022-09-03 LAB — CBC
HCT: 45.2 % (ref 39.0–52.0)
Hemoglobin: 15.6 g/dL (ref 13.0–17.0)
MCH: 29.5 pg (ref 26.0–34.0)
MCHC: 34.5 g/dL (ref 30.0–36.0)
MCV: 85.4 fL (ref 80.0–100.0)
Platelets: 242 10*3/uL (ref 150–400)
RBC: 5.29 MIL/uL (ref 4.22–5.81)
RDW: 11.8 % (ref 11.5–15.5)
WBC: 7.4 10*3/uL (ref 4.0–10.5)
nRBC: 0 % (ref 0.0–0.2)

## 2022-09-03 LAB — TROPONIN I (HIGH SENSITIVITY)
Troponin I (High Sensitivity): 3 ng/L (ref <18)
Troponin I (High Sensitivity): 3 ng/L (ref <18)

## 2022-09-03 LAB — POTASSIUM: Potassium: 3 mmol/L — ABNORMAL LOW (ref 3.5–5.1)

## 2022-09-03 MED ORDER — POTASSIUM CHLORIDE 10 MEQ/100ML IV SOLN: 10.0000 meq | Freq: Once | INTRAVENOUS | Status: DC

## 2022-09-03 MED ORDER — POTASSIUM CHLORIDE CRYS ER 20 MEQ PO TBCR
40.0000 meq | EXTENDED_RELEASE_TABLET | Freq: Once | ORAL | Status: AC
  Administered 2022-09-03: 40 meq via ORAL
  Filled 2022-09-03: qty 2

## 2022-09-03 NOTE — ED Notes (Signed)
First Nurse Note: Patient to ED via [facility] from work for CP. HR 120- afib, hx of same. Takes cardizem- took fast relief when EMS arrived.  20 L AC 105/65 98 cbg 99%

## 2022-09-03 NOTE — ED Triage Notes (Signed)
Patient states after eating lunch heart rate was fluctuating between 50-130's, fluttering in chest, numbness to fingers and toes; took 30mg  of Diltiazem. At this time patient reports feeling like there is a pit in his stomach. Patient currently wearing a loop recorder.

## 2022-09-03 NOTE — ED Provider Notes (Signed)
[facility] Emergency Department Provider Note     Event Date/Time   First MD Initiated Contact with Patient 09/03/22 1445     (approximate)   History   Tachycardia   HPI  Allen Wilson is a 27 y.o. male with a history of drug overdose, acute psychosis, tachyarrhythmia, depression, paroxysmal A-fib, presents to the ED after the onset of heart palpitations and fluctuating heart rate.  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  Patient was noticed between 50s and 130s.  He also noted fluttering in his chest as well as numbness to his fingers and toes.  He denies any syncope or near syncope but did report feeling some dizziness.  Patient took 30 mg of his fast-acting diltiazem prior to arrival.  Patient reports at this time that he is feeling as if there is a "pit" in his stomach.  Patient is currently wearing a loop recorder as he is under the care of [facility].  According to the chart review, the patient is status post ILR implant to further quantify his SVT/A-fib.  He recently had his diltiazem increased to 300 mg daily.  At time of this evaluation, patient is at baseline denies any acute chest pain, dizziness, paresthesias, or any abnormal heart rate.   Physical Exam   Triage Vital Signs: ED Triage Vitals  Encounter Vitals Group     BP 09/03/22 1342 123/78     Systolic BP Percentile --      Diastolic BP Percentile --      Pulse Rate 09/03/22 1342 60     Resp 09/03/22 1342 17     Temp 09/03/22 1342 98 F (36.7 C)     Temp Source 09/03/22 1342 Oral     SpO2 09/03/22 1342 100 %     Weight 09/03/22 1344 235 lb (106.6 kg)     Height 09/03/22 1344 6\' 1"  (1.854 m)     Head Circumference --      Peak Flow --      Pain Score 09/03/22 1356 0     Pain Loc --      Pain Education --      Exclude from Growth Chart --     Most recent vital signs: Vitals:   09/03/22 1342 09/03/22 1648   BP: 123/78 120/80  Pulse: 60 78  Resp: 17   Temp: 98 F (36.7 C) 98.1 F (36.7 C)  SpO2: 100% 98%    General Awake, no distress. NAD HEENT NCAT. PERRL. EOMI. No rhinorrhea. Mucous membranes are moist.  CV:  Good peripheral perfusion. RRR RESP:  Normal effort. CTA ABD:  No distention.    ED Results / Procedures / Treatments   Labs (all labs ordered are listed, but only abnormal results are displayed) Labs Reviewed  BASIC METABOLIC PANEL - Abnormal; Notable for the following components:      Result Value   Potassium 2.7 (*)    Glucose, Bld 128 (*)    All other components within normal limits  POTASSIUM - Abnormal; Notable for the following components:   Potassium 3.0 (*)    All other components within normal limits  CBC  TROPONIN I (HIGH SENSITIVITY)  TROPONIN I (HIGH SENSITIVITY)     EKG  Vent. rate 61 BPM PR interval 144 ms QRS duration 102 ms QT/QTcB 402/404 ms P-R-T axes 52 87 -16 Normal sinus rhythm Cannot rule out Inferior infarct ,  age undetermined Abnormal ECG When compared with  RADIOLOGY  I personally viewed and evaluated these images as part of my medical decision making, as well as reviewing the written report by the radiologist.  ED Provider Interpretation: no acute findings  DG Chest 2 View  Result Date: 09/03/2022 CLINICAL DATA:  Palpitations. EXAM: CHEST - 2 VIEW COMPARISON:  February 6, 24. FINDINGS: The heart size and mediastinal contours are within normal limits. Loop recorder projects over the left chest. Both lungs are clear. No visible pleural effusions or pneumothorax. No acute osseous abnormality. IMPRESSION: No active cardiopulmonary disease. Electronically Signed   By: Feliberto Harts M.D.   On: 09/03/2022 15:17     PROCEDURES:  Critical Care performed: Yes, see critical care procedure note(s)  Procedures   MEDICATIONS ORDERED IN ED: Medications  potassium chloride SA (KLOR-CON M) CR tablet 40 mEq (40 mEq Oral Given  09/03/22 1644)     IMPRESSION / MDM / ASSESSMENT AND PLAN / ED COURSE  I reviewed the triage vital signs and the nursing notes.                              Differential diagnosis includes, but is not limited to, ACS, aortic dissection, pulmonary embolism, cardiac tamponade, pneumothorax, pneumonia, pericarditis, myocarditis, GI-related causes including esophagitis/gastritis, and musculoskeletal chest wall pain.     Patient's presentation is most consistent with acute presentation with potential threat to life or bodily function.  The patient is on the cardiac monitor to evaluate for evidence of arrhythmia and/or significant heart rate changes.  Patient's diagnosis is consistent with sinus arrhythmia/tachycardia.  Patient with reassuring exam and workup at this time without evidence of ACS, AMI, or intrathoracic process.  Labs are reassuring with a normal troponin x 1 and no abnormal cell counts.  Initial BMP with a critical low potassium reported at 2.7.  Repeat potassium normalizing to 3.0.  Patient given 40 mill equivalents dose of potassium orally in the ED.  He is denying any pain, discomfort, arrhythmias or palpitations at the time of this evaluation.  Patient is requesting discharge home. Patient is to follow up with Dr. Lalla Brothers his cardiologist, as scheduled as needed or otherwise directed. Patient is given ED precautions to return to the ED for any worsening or new symptoms.   FINAL CLINICAL IMPRESSION(S) / ED DIAGNOSES   Final diagnoses:  Tachycardia  Hypokalemia     Rx / DC Orders   ED Discharge Orders     None        Note:  This document was prepared using Dragon voice recognition software and may include unintentional dictation errors.    Lissa Hoard, PA-C 09/03/22 1856    Minna Antis, MD 09/03/22 [phone]

## 2022-09-03 NOTE — Discharge Instructions (Signed)
Your exam, labs, EKG, and chest x-ray are essentially normal at this time.  You did have evidence of a mild hypokalemia at 3.0 mg/dl.  This has been supplemented with an oral dose of potassium in the ED.  You should follow-up with your cardiologist as discussed.  Return to the ED if needed.

## 2022-09-04 ENCOUNTER — Ambulatory Visit: Payer: 59 | Attending: Cardiology | Admitting: Cardiology

## 2022-09-04 ENCOUNTER — Other Ambulatory Visit
Admission: RE | Admit: 2022-09-04 | Discharge: 2022-09-04 | Disposition: A | Discharge status: Home / Self Care | Payer: 59 | Source: Ambulatory Visit | Attending: Cardiology | Admitting: Cardiology

## 2022-09-04 ENCOUNTER — Encounter: Payer: Self-pay | Admitting: Cardiology

## 2022-09-04 VITALS — BP 114/76 | HR 68 | Ht 73.0 in | Wt 239.0 lb

## 2022-09-04 DIAGNOSIS — I471 Supraventricular tachycardia, unspecified: Secondary | ICD-10-CM

## 2022-09-04 DIAGNOSIS — E876 Hypokalemia: Secondary | ICD-10-CM

## 2022-09-04 DIAGNOSIS — G4733 Obstructive sleep apnea (adult) (pediatric): Secondary | ICD-10-CM

## 2022-09-04 DIAGNOSIS — R000 Tachycardia, unspecified: Secondary | ICD-10-CM

## 2022-09-04 LAB — BASIC METABOLIC PANEL
Anion gap: 9 (ref 5–15)
BUN: 16 mg/dL (ref 6–20)
CO2: 24 mmol/L (ref 22–32)
Calcium: 9.7 mg/dL (ref 8.9–10.3)
Chloride: 104 mmol/L (ref 98–111)
Creatinine, Ser: 1.17 mg/dL (ref 0.61–1.24)
GFR, Estimated: >60 mL/min (ref >60)
Glucose, Bld: 105 mg/dL — ABNORMAL HIGH (ref 70–99)
Potassium: 4.1 mmol/L (ref 3.5–5.1)
Sodium: 137 mmol/L (ref 135–145)

## 2022-09-04 NOTE — Patient Instructions (Addendum)
Medication Instructions:  START taking a multivitamin daily.  *If you need a refill on your cardiac medications before your next appointment, please call your pharmacy*   Lab Work: BMP - Please go to the [facility]. You will check in at the front desk to the right as you walk into the atrium. Valet Parking is offered if needed. - No appointment needed. You may go any day between 7 am and 6 pm.  If you have labs (blood work) drawn today and your tests are completely normal, you will receive your results only by: MyChart Message (if you have MyChart) OR A paper copy in the mail If you have any lab test that is abnormal or we need to change your treatment, we will call you to review the results.   Testing/Procedures: No testing ordered   Follow-Up: At [facility], you and your health needs are our priority.  As part of our continuing mission to provide you with exceptional heart care, we have created designated Provider Care Teams.  These Care Teams include your primary Cardiologist (physician) and Advanced Practice Providers (APPs -  Physician Assistants and Nurse Practitioners) who all work together to provide you with the care you need, when you need it.  We recommend signing up for the patient portal called "MyChart".  Sign up information is provided on this After Visit Summary.  MyChart is used to connect with patients for Virtual Visits (Telemedicine).  Patients are able to view lab/test results, encounter notes, upcoming appointments, etc.  Non-urgent messages can be sent to your provider as well.   To learn more about what you can do with MyChart, go to [email or web address].    Your next appointment:   10/08/22 at 8:40 AM  Provider:   Steffanie Dunn, MD

## 2022-09-04 NOTE — Progress Notes (Signed)
Cardiology Office Note Date:  09/04/2022  Patient ID:  Allen Wilson 1995/09/10, MRN [id number] PCP:  Joni Reining, PA-C  Cardiologist:  None Electrophysiologist: Lanier Prude, MD    Chief Complaint: ER follow-up, palpitations  History of Present Illness: Allen Wilson is a 27 y.o. male with PMH notable for parox Afib, tachyarrhythmia, obesity, anxiety; seen today for Lanier Prude, MD for acute visit after ER eval yesterday for palpitations.  Afib, SVT first diagnosed 07/2021. He was at work at [employer] when he felt off, co-workers put him on heart monitor and noted his HR was > 200. Vagal maneuvers unsuccessful > ER > adenosine x 3 was unsuccessful, but slowed rhythm and he transitioned to AFib.  He is status post ILR implant for further quantification of his SVT, A-fib.   Dr. Lalla Brothers and I have seen him several times over the past 6 months, I most recently saw him 07/2022.  He was tolerating 300 mg diltiazem well, needing rare as needed Dilt doses for symptomatic episodes.  At her last visit, he had been unsuccessful with CPAP machine and was in the process of setting up dentistry appointment for oral appliance for his OSA.  Continuing to drink EtOH and use dip. Yesterday, he was at work and noticed heart palpitations and fluctuating heart rate, also having numbness to fingers and toes.  He took his as needed Dilt.  Labs in ER revealed hypokalemia 2.7, improved to 3.0 with 40 mEq potassium.  EKG showed atrial ectopy, rate 61.  On follow-up today, patient states that he feels "okay".  Yesterday during palpitation episode, his hands and feet were tingling, left arm was numb.  He states that it was one of the worst episodes he has had.  He was able to take as needed diltiazem prior to being transported to emergency room.  He says that he could tell that the as needed Dilt was working at about the time he got in the truck to go to ER.  At that time his pulse was low  50s, blood pressure 100s over 40s.  He has been following a carnivore diet over the last month, has lost 20-25 pounds.  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  He states the city MD will not clear him until I have cleared him.   Currently, he denies chest pain, dyspnea, PND, orthopnea, nausea, vomiting, dizziness, syncope, edema, weight gain, or early satiety.     Device Information: MDT ILR, imp 03/2022; arrhythmia   Past Medical History:  Diagnosis Date   Tachycardia     Past Surgical History:  Procedure Laterality Date   TYMPANOSTOMY TUBE PLACEMENT     childhood    Current Outpatient Medications  Medication Instructions   ALPRAZolam (XANAX) 0.25 mg, Oral, Daily PRN   diltiazem (CARDIZEM CD) 300 mg, Oral, Daily   diltiazem (CARDIZEM) 30 MG tablet TAKE ONE TABLET BY MOUTH TWICE DAILY AS NEEDED (HR>100)    Social History:  The patient  reports that he has never smoked. His smokeless tobacco use includes chew. He reports current alcohol use of about 5.0 standard drinks of alcohol per week. He reports that he does not use drugs.   Family History:  The patient's family history includes Arthritis/Rheumatoid in his mother; Heart attack in his maternal  grandfather; Hypertension in his father; Other in his mother; Stroke in his maternal grandmother.  ROS:  Please see the history of present illness. All other systems are reviewed and otherwise negative.   PHYSICAL EXAM:  VS:  There were no vitals taken for this visit. BMI: There is no height or weight on file to calculate BMI.  GEN- The patient is well appearing, alert and oriented x 3 today.   Lungs- Clear to ausculation bilaterally, normal work of breathing.  Heart- Regular rate and rhythm, no murmurs, rubs or gallops Extremities- No peripheral edema, warm,  dry Skin-  ILR insertion site well-healed   MDT Carelink reviewed by myself:  Battery good Symptomatic episodes yesterday appear SVT, ?A. tach   EKG is not ordered. Personal review of EKG from  09/03/2022  shows: Sinus rhythm with atrial ectopy, rate 61bpm Peaked P-waves  Recent Labs: 06/09/2022: ALT 39; TSH 1.650 09/03/2022: BUN 17; Creatinine, Ser 0.99; Hemoglobin 15.6; Platelets 242; Potassium 3.0; Sodium 136  06/09/2022: Chol/HDL Ratio 5.7; Cholesterol, Total 212; HDL 37; LDL Chol Calc (NIH) 122; Triglycerides 298   Estimated Creatinine Clearance: 144.9 mL/min (by C-G formula based on SCr of 0.99 mg/dL).   Wt Readings from Last 3 Encounters:  09/03/22 235 lb (106.6 kg)  07/30/22 256 lb (116.1 kg)  06/12/22 250 lb (113.4 kg)     Additional studies reviewed include: Previous EP, cardiology notes.   Long term monitor, 10/16/2021 HR 39 - 180 bpm, average 78 bpm. Rare supraventricular and ventricular ectopy. No atrial fibrillation detected.  TTE, 08/08/2021  1. Left ventricular ejection fraction, by estimation, is 60 to 65%. The left ventricle has normal function. The left ventricle has no regional wall motion abnormalities. Left ventricular diastolic parameters were normal.   2. Right ventricular systolic function is normal. The right ventricular size is normal.   3. The mitral valve is normal in structure. No evidence of mitral valve regurgitation. No evidence of mitral stenosis.   4. The aortic valve was not well visualized. Aortic valve regurgitation is not visualized. No aortic stenosis is present.   5. The inferior vena cava is normal in size with greater than 50% respiratory variability, suggesting right atrial pressure of 3 mmHg.   ASSESSMENT AND PLAN:  #) parox Afib #) tachyarrhythmia S/p ILR insertion 0% AF burden by device Symptomatic SVT on ILR Overall burden has decreased on 300 mg diltiazem daily Had very symptomatic episode yesterday that was also relieved  with as needed Dilt  #) hypokalemia While in ER after symptomatic palpitation episode, found to be hypokalemic Received potassium PO Query whether this is related to his dietary changes over the last month. Recommended he increase electrolyte beverage consumption, or liberalize diet to include fruits and whole grains Recommended daily multivitamin Updated Bmet today with normal potassium   Current medicines are reviewed at length with the patient today.   The patient does not have concerns regarding his medicines.  The following changes were made today:   none  Labs/ tests ordered today include:  Orders Placed This Encounter  Procedures   Basic Metabolic Panel (BMET)     Disposition: Follow up with Dr. Lalla Brothers in 1 months   Signed, Sherie Don, NP  09/04/22  8:42 AM  Electrophysiology [facility]

## 2022-09-05 DIAGNOSIS — F410 Panic disorder [episodic paroxysmal anxiety] without agoraphobia: Secondary | ICD-10-CM | POA: Diagnosis not present

## 2022-09-05 DIAGNOSIS — F3181 Bipolar II disorder: Secondary | ICD-10-CM | POA: Diagnosis not present

## 2022-09-05 NOTE — Progress Notes (Signed)
Carelink Summary Report / Loop Recorder 

## 2022-09-10 ENCOUNTER — Other Ambulatory Visit: Payer: Self-pay

## 2022-09-16 DIAGNOSIS — G4733 Obstructive sleep apnea (adult) (pediatric): Secondary | ICD-10-CM | POA: Diagnosis not present

## 2022-09-22 ENCOUNTER — Ambulatory Visit: Payer: 59

## 2022-09-22 DIAGNOSIS — I480 Paroxysmal atrial fibrillation: Secondary | ICD-10-CM

## 2022-09-24 ENCOUNTER — Emergency Department
Admission: EM | Admit: 2022-09-24 | Discharge: 2022-09-25 | Disposition: A | Discharge status: Home / Self Care | Payer: 59 | Attending: Emergency Medicine | Admitting: Emergency Medicine

## 2022-09-24 DIAGNOSIS — R42 Dizziness and giddiness: Secondary | ICD-10-CM | POA: Diagnosis not present

## 2022-09-24 DIAGNOSIS — R202 Paresthesia of skin: Secondary | ICD-10-CM | POA: Diagnosis not present

## 2022-09-24 DIAGNOSIS — E876 Hypokalemia: Secondary | ICD-10-CM | POA: Diagnosis not present

## 2022-09-24 DIAGNOSIS — R000 Tachycardia, unspecified: Secondary | ICD-10-CM | POA: Diagnosis not present

## 2022-09-24 DIAGNOSIS — R002 Palpitations: Secondary | ICD-10-CM | POA: Insufficient documentation

## 2022-09-24 LAB — CBC WITH DIFFERENTIAL/PLATELET
Abs Immature Granulocytes: 0.03 10*3/uL (ref 0.00–0.07)
Basophils Absolute: 0.1 10*3/uL (ref 0.0–0.1)
Basophils Relative: 1 %
Eosinophils Absolute: 0 10*3/uL (ref 0.0–0.5)
Eosinophils Relative: 0 %
HCT: 47.3 % (ref 39.0–52.0)
Hemoglobin: 15.8 g/dL (ref 13.0–17.0)
Immature Granulocytes: 0 %
Lymphocytes Relative: 36 %
Lymphs Abs: 3.4 10*3/uL (ref 0.7–4.0)
MCH: 29 pg (ref 26.0–34.0)
MCHC: 33.4 g/dL (ref 30.0–36.0)
MCV: 86.9 fL (ref 80.0–100.0)
Monocytes Absolute: 0.7 10*3/uL (ref 0.1–1.0)
Monocytes Relative: 8 %
Neutro Abs: 5.2 10*3/uL (ref 1.7–7.7)
Neutrophils Relative %: 55 %
Platelets: 260 10*3/uL (ref 150–400)
RBC: 5.44 MIL/uL (ref 4.22–5.81)
RDW: 11.8 % (ref 11.5–15.5)
WBC: 9.4 10*3/uL (ref 4.0–10.5)
nRBC: 0 % (ref 0.0–0.2)

## 2022-09-24 LAB — BASIC METABOLIC PANEL
Anion gap: 6 (ref 5–15)
BUN: 15 mg/dL (ref 6–20)
CO2: 22 mmol/L (ref 22–32)
Calcium: 9 mg/dL (ref 8.9–10.3)
Chloride: 107 mmol/L (ref 98–111)
Creatinine, Ser: 0.99 mg/dL (ref 0.61–1.24)
GFR, Estimated: >60 mL/min (ref >60)
Glucose, Bld: 125 mg/dL — ABNORMAL HIGH (ref 70–99)
Potassium: 3.3 mmol/L — ABNORMAL LOW (ref 3.5–5.1)
Sodium: 135 mmol/L (ref 135–145)

## 2022-09-24 LAB — PHOSPHORUS: Phosphorus: 2.8 mg/dL (ref 2.5–4.6)

## 2022-09-24 LAB — CBG MONITORING, ED: Glucose-Capillary: 125 mg/dL — ABNORMAL HIGH (ref 70–99)

## 2022-09-24 LAB — MAGNESIUM: Magnesium: 2.3 mg/dL (ref 1.7–2.4)

## 2022-09-24 MED ORDER — LIDOCAINE HCL (PF) 1 % IJ SOLN: 5.0000 mL | Freq: Once | INTRAMUSCULAR | Status: DC

## 2022-09-24 NOTE — ED Triage Notes (Signed)
Patient states that he was at work when he started to feel faint, and like his heart was racing. Patient has had this happen before and says that his potassium was 2.3. Patient also states that he has a history of afib (not anti-coagulated), and has a loop recorder in place. Patient takes 300mg  of diltiazem daily, which he took this morning, and then he also has a rescue dose that he took of 30mg , that he took about 30 minutes ago. Patient's HR is 75 in triage. Patient denies pain, SOB, or any other symptoms at this time.

## 2022-09-24 NOTE — ED Provider Notes (Signed)
[facility] Provider Note    Event Date/Time   First MD Initiated Contact with Patient 09/24/22 2301     (approximate)   History   Palpitations   HPI  Allen Wilson is a 27 y.o. male   Past medical history of SVT and paroxysmal atrial fibrillation on diltiazem who presents to the emergency department with potation's earlier today.  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  He took his rescue diltiazem which typically ends the episodes but took longer than usual, with his heart rate fluctuating from the 80s to 110s with minimal exertion.  He decided to come the hospital because of these sustained elevated heart rates.  He denies chest pain.    External Medical Documents Reviewed: Cardiology note from July documenting his tacky dysrhythmias, loop recorder in place, and medications      Physical Exam   Triage Vital Signs: ED Triage Vitals  Encounter Vitals Group     BP 09/24/22 2230 (!) 142/90     Systolic BP Percentile --      Diastolic BP Percentile --      Pulse Rate 09/24/22 2230 77     Resp 09/24/22 2230 16     Temp 09/24/22 2233 99 F (37.2 C)     Temp Source 09/24/22 2233 Oral     SpO2 09/24/22 2230 97 %     Weight 09/24/22 2233 240 lb (108.9 kg)     Height 09/24/22 2233 6\' 1"  (1.854 m)     Head Circumference --      Peak Flow --      Pain Score 09/24/22 2233 0     Pain Loc --      Pain Education --      Exclude from Growth Chart --     Most recent vital signs: Vitals:   09/24/22 2300 09/24/22 2330  BP: (!) 140/89 (!) 145/94  Pulse: 73 79  Resp: 13 (!) 27  Temp:    SpO2: 100% 99%    General: Awake, no distress.  CV:  Good  peripheral perfusion.  Resp:  Normal effort.  Abd:  No distention.  Other:  Wake alert comfortable with normal hemodynamics, normal rate, rhythm.  Heart sounds normal appears euvolemic lungs clear.   ED Results / Procedures / Treatments   Labs (all labs ordered are listed, but only abnormal results are displayed) Labs Reviewed  BASIC METABOLIC PANEL - Abnormal; Notable for the following components:      Result Value   Potassium 3.3 (*)    Glucose, Bld 125 (*)    All other components within normal limits  CBG MONITORING, ED - Abnormal; Notable for the following components:   Glucose-Capillary 125 (*)    All other components within normal limits  CBC WITH DIFFERENTIAL/PLATELET  MAGNESIUM  PHOSPHORUS     I ordered and reviewed the above labs they are notable for K slightly low at 3.3.  EKG  ED ECG REPORT I, Pilar Jarvis, the attending physician, personally viewed and interpreted this ECG.   Date: 09/24/2022  EKG Time: 2228  Rate: 80  Rhythm: nsr  Axis: nl  Intervals:none  ST&T Change: no stemi   PROCEDURES:  Critical Care  performed: No  Procedures   MEDICATIONS ORDERED IN ED: Medications - No data to display  IMPRESSION / MDM / ASSESSMENT AND PLAN / ED COURSE  I reviewed the triage vital signs and the nursing notes.                                Patient's presentation is most consistent with acute presentation with potential threat to life or bodily function.  Differential diagnosis includes, but is not limited to, tachydysrhythmia like SVT, paroxysmal atrial fibrillation, electrolyte derangement, ACS   The patient is on the cardiac monitor to evaluate for evidence of arrhythmia and/or significant heart rate changes.  MDM:    With a history of tacky dysrhythmias paroxysmal atrial fibrillation and supraventricular tachycardia with an episode today that has finally resolved after taking his rescue diltiazem dose of 30 mg.  He has been compliant with all  medications and noes obvious inciting events.  He denies any chest pain his EKG is nonischemic and is normal sinus rhythm currently.  He had a history of low potassium for which she has been taking electrolyte drinks and his potassium is much improved today at 3.3.  He has cardiology follow-up.  I considered admission but given his normal rate and rhythm currently asymptomatic and normal workup with again be discharged and have cardiology follow-up.        FINAL CLINICAL IMPRESSION(S) / ED DIAGNOSES   Final diagnoses:  Palpitations  Hypokalemia     Rx / DC Orders   ED Discharge Orders          Ordered    Ambulatory referral to Cardiology       Comments: If you have not heard from the Cardiology office within the next 72 hours please call [phone].   09/24/22 2354             Note:  This document was prepared using Dragon voice recognition software and may include unintentional dictation errors.    Pilar Jarvis, MD 09/25/22 [phone]

## 2022-09-24 NOTE — Discharge Instructions (Addendum)
Thank you for choosing [place] for your health care today!  Please see your primary doctor this week for a follow up appointment.   If you have any new, worsening, or unexpected symptoms call your doctor right away or come back to the emergency department for reevaluation.  It was my pleasure to care for you today.   Daneil Dan [place] Charon, MD

## 2022-09-25 ENCOUNTER — Telehealth: Payer: Self-pay | Admitting: Cardiology

## 2022-09-25 ENCOUNTER — Encounter: Payer: Self-pay | Admitting: Cardiology

## 2022-09-25 ENCOUNTER — Ambulatory Visit: Payer: 59 | Attending: Cardiology | Admitting: Cardiology

## 2022-09-25 VITALS — BP 110/78 | HR 87 | Ht 73.0 in | Wt 241.2 lb

## 2022-09-25 DIAGNOSIS — R002 Palpitations: Secondary | ICD-10-CM

## 2022-09-25 DIAGNOSIS — I471 Supraventricular tachycardia, unspecified: Secondary | ICD-10-CM

## 2022-09-25 DIAGNOSIS — E876 Hypokalemia: Secondary | ICD-10-CM

## 2022-09-25 MED ORDER — POTASSIUM CHLORIDE CRYS ER 20 MEQ PO TBCR: 20.0000 meq | EXTENDED_RELEASE_TABLET | Freq: Two times a day (BID) | ORAL | 3 refills | Status: DC

## 2022-09-25 NOTE — Telephone Encounter (Signed)
Patient wants call back directly from [place].

## 2022-09-25 NOTE — Patient Instructions (Signed)
Medication Instructions:   START Potassium 20 meq 1 tablet by mouth twice daily.  *If you need a refill on your cardiac medications before your next appointment, please call your pharmacy*   Lab Work:  Your provider would like for you to return in 1-2 weeks to have the following labs drawn: BMET.   Please go to [facility] [address] ([facility] [address], [place] [address] You do not need an appointment.  They are open from 7:30 am-4 pm.  Lunch from 1:00 pm- 2:00 pm    You may also go to any of these [facility] locations:  [employer]  - [address] [employer] [address] Chief Technology Officer)     If you have labs (blood work) drawn today and your tests are completely normal, you will receive your results only by: [employer] (if you have MyChart) OR A paper copy in the mail If you have any lab test that is abnormal or we need to change your treatment, we will call you to review the results.   Testing/Procedures:  No testing ordered today.   Follow-Up: At [facility], you and your health needs are our priority.  As part of our continuing mission to provide you with exceptional heart care, we have created designated Provider Care Teams.  These Care Teams include your primary Cardiologist (physician) and Advanced Practice Providers (APPs -  Physician Assistants and Nurse Practitioners) who all work together to provide you with the care you need, when you need it.  We recommend signing up for the patient portal called "MyChart".  Sign up information is provided on this After Visit Summary.  MyChart is used to connect with patients for Virtual Visits (Telemedicine).  Patients are able to view lab/test results, encounter notes, upcoming appointments, etc.  Non-urgent messages can be sent to your provider as well.   To learn more about what you can do with MyChart, go to [email or web address].    Your next appointment:   2-3   month(s)  Provider:   Sherie Don, NP    *Please cancel current scheduled appointment with Dr. Lalla Brothers.

## 2022-09-25 NOTE — Progress Notes (Signed)
Cardiology Office Note Date:  09/25/2022  Patient ID:  Allen Wilson, Allen Wilson [birth year], MRN [id number] PCP:  Joni Reining, PA-C  Cardiologist:  None Electrophysiologist: Lanier Prude, MD    Chief Complaint: ER follow-up, palpitations  History of Present Illness: Allen Wilson is a 27 y.o. male with PMH notable for parox Afib, tachyarrhythmia, obesity, anxiety; seen today for Lanier Prude, MD for acute visit after ER eval yesterday for palpitations.  Afib, SVT first diagnosed 07/2021. He was at work at [employer] when he felt off, co-workers put him on heart monitor and noted his HR was > 200. Vagal maneuvers unsuccessful > ER > adenosine x 3 was unsuccessful, but slowed rhythm and he transitioned to AFib.  He is status post ILR implant for further quantification of his SVT, A-fib.   Dr. Lalla Brothers and I have seen him several times over the past 6 months, I most recently saw him 08/2022.  He was tolerating 300 mg diltiazem well, needing rare as needed Dilt doses for symptomatic episodes.  Had 1 recent ER visit for 'worst palpitation episode" associated with body tingling.   He went to ER yesterday for same palpitations with body tingling. Labs revealed hypoK to 3.3. he says that he began to feel palpitations and tingling while at work, and then took his PRN 30mg  dilt. He laid down for a little while, drank a bottle of body armour, and tried vagal maneuvers. When he was still not feeling well after about 20 minutes, he talked to his Biomedical scientist, who called EMS and sent him to ER. While in ER, he felt his heart rate normalize and symptoms resolve. He says that if he were at home during episode, he would not have proceeded to ER and would have tried to wait a little longer for PRN med to work.   He is frustrated that these episodes continue to happen, especially at work.  He continues with carnivore diet, [place]  Currently, he denies chest pain, dyspnea, PND, orthopnea, nausea, vomiting, dizziness, syncope, edema, weight gain, or early satiety.     Device Information: MDT ILR, imp 03/2022; arrhythmia   Past Medical History:  Diagnosis Date   Tachycardia     Past Surgical History:  Procedure Laterality Date   TYMPANOSTOMY TUBE PLACEMENT     childhood    Current Outpatient Medications  Medication Instructions   ALPRAZolam (XANAX) 0.25 mg, Oral, Daily PRN   diltiazem (CARDIZEM CD) 300 mg, Oral, Daily   diltiazem (CARDIZEM) 30 MG tablet TAKE ONE TABLET BY MOUTH TWICE DAILY AS NEEDED (HR>100)    Social History:  The patient  reports that he has never smoked. His smokeless tobacco use includes chew. He reports current alcohol use of about 5.0 standard drinks of alcohol per week. He reports that he does not use drugs.   Family History:  The patient's family history includes Arthritis/Rheumatoid in his mother; Heart attack in his maternal grandfather; Hypertension in his father; Other in his mother; Stroke in his maternal grandmother.  ROS:  Please see the history of present illness. All other systems are reviewed and otherwise negative.   PHYSICAL EXAM:  VS:  BP 110/78 (BP Location: Left Arm, Patient Position: Sitting, Cuff Size: Normal)   Pulse 87   Ht 6\' 1"  (1.854 m)   Wt 241 lb 4 oz (109.4 kg)   SpO2 98%   BMI 31.83 kg/m  BMI:  There is no height or weight on file to calculate BMI.  GEN- The patient is well appearing, alert and oriented x 3 today.   Lungs- Clear to ausculation bilaterally, normal work of breathing.  Heart- Regular rate and rhythm, no murmurs, rubs or gallops Extremities- No peripheral edema, warm, dry Skin-  ILR insertion site well-healed   MDT Carelink reviewed by myself:  Battery good Symptomatic episode yesterday appear sinus tach   EKG is not ordered. Personal review of EKG from  09/24/2022  shows: Sinus rhythm, rate 80bpm   Recent  Labs: 06/09/2022: ALT 39; TSH 1.650 09/24/2022: BUN 15; Creatinine, Ser 0.99; Hemoglobin 15.8; Magnesium 2.3; Platelets 260; Potassium 3.3; Sodium 135  06/09/2022: Chol/HDL Ratio 5.7; Cholesterol, Total 212; HDL 37; LDL Chol Calc (NIH) 122; Triglycerides 298   Estimated Creatinine Clearance: 146.3 mL/min (by C-G formula based on SCr of 0.99 mg/dL).   Wt Readings from Last 3 Encounters:  09/24/22 240 lb (108.9 kg)  09/04/22 239 lb (108.4 kg)  09/03/22 235 lb (106.6 kg)     Additional studies reviewed include: Previous EP, cardiology notes.   Long term monitor, 10/16/2021 HR 39 - 180 bpm, average 78 bpm. Rare supraventricular and ventricular ectopy. No atrial fibrillation detected.  TTE, 08/08/2021  1. Left ventricular ejection fraction, by estimation, is 60 to 65%. The left ventricle has normal function. The left ventricle has no regional wall motion abnormalities. Left ventricular diastolic parameters were normal.   2. Right ventricular systolic function is normal. The right ventricular size is normal.   3. The mitral valve is normal in structure. No evidence of mitral valve regurgitation. No evidence of mitral stenosis.   4. The aortic valve was not well visualized. Aortic valve regurgitation is not visualized. No aortic stenosis is present.   5. The inferior vena cava is normal in size with greater than 50% respiratory variability, suggesting right atrial pressure of 3 mmHg.   ASSESSMENT AND PLAN:  #) parox Afib #) tachyarrhythmia S/p ILR insertion 0% AF burden by device Symptomatic atach on ILR in the past Yesterday's episode appears sinus Overall burden has decreased on 300 mg diltiazem daily Continue 30mg  PRN dilt and recommended to allow full 45-60 minutes Work letter provided with these recommendations, ok to return to work  #) hypokalemia Continues to be hypoK, interestingly his most symptomatic episodes of palpitations have correlated with low potassium Will start  potassium BID - repeat BMP in 1-2 weeks  Continue daily multivit   Current medicines are reviewed at length with the patient today.   The patient has concerns regarding his medicines.  The following changes were made today:   START potassium supplement  Labs/ tests ordered today include:  Orders Placed This Encounter  Procedures   Basic metabolic panel     Disposition: Follow up with EP APP in 2-3 months   Signed, Sherie Don, NP  09/25/22  9:22 AM  Electrophysiology [facility]

## 2022-09-30 ENCOUNTER — Telehealth: Payer: Self-pay

## 2022-09-30 ENCOUNTER — Ambulatory Visit: Payer: Self-pay | Admitting: Physician Assistant

## 2022-09-30 ENCOUNTER — Encounter: Payer: 59 | Admitting: Cardiology

## 2022-09-30 VITALS — BP 130/78 | HR 78 | Resp 16

## 2022-09-30 DIAGNOSIS — I471 Supraventricular tachycardia, unspecified: Secondary | ICD-10-CM

## 2022-09-30 NOTE — Telephone Encounter (Signed)
Patient aware of recommendations. He did use cardizem 30 mg for both episodes. He states that it does take an hour for the medication to start working which worries him since he does work at the [employer] and it causes him to not be able to perform his duties during these episodes.

## 2022-09-30 NOTE — Telephone Encounter (Signed)
Patient called in requesting an appointment with Dr. Lalla Brothers due to having symptomatic episodes last night and again this morning. Patient reported that this morning once he got to work they did an EKG and he had gone back into normal rhythm.  [employer] rep in the office was able to pull up the patient's triggered events. Showed some nonsustained SVT. Per Dr. Lalla Brothers we will continue to monitor.

## 2022-09-30 NOTE — Progress Notes (Signed)
Stated he was concerned about his arrhythmia.  He follows with cardiology and stated has a loop recorder.

## 2022-09-30 NOTE — Progress Notes (Signed)
   Subjective: Irregular heartbeat    Patient ID: Allen Wilson, male    DOB: April 02, 1995, 27 y.o.   MRN: [id number]  HPI Patient has a history of paroxysmal SVT.  Patient states he took his Cardizem prior to arrival 30 minutes ago.  Patient requesting EKG.  Patient has a home remote transmitter which is already sent his readings to his cardiologist.  Review of Systems Tachyarrhythmia with possible normal atrial fibrillation.    Objective:   Physical Exam   BP  130/78  Pulse 110 78  Resp  16  SpO2  98 %  Patient is in no acute distress.  Clinic EKG shows normal sinus rhythm.  Patient state he is feeling better.     Assessment & Plan: Tachyarrhythmia  Patient advised to follow-up with scheduled cardiology appointment next week.  Report to ER if condition worsens.

## 2022-10-07 LAB — BASIC METABOLIC PANEL
CO2: 24 mmol/L (ref 20–29)
Potassium: 3.9 mmol/L (ref 3.5–5.2)

## 2022-10-07 NOTE — Progress Notes (Signed)
Carelink Summary Report / Loop Recorder 

## 2022-10-08 ENCOUNTER — Ambulatory Visit: Payer: Self-pay | Admitting: Physician Assistant

## 2022-10-08 ENCOUNTER — Other Ambulatory Visit: Payer: Self-pay | Admitting: Physician Assistant

## 2022-10-08 ENCOUNTER — Encounter: Payer: Self-pay | Admitting: Physician Assistant

## 2022-10-08 ENCOUNTER — Encounter: Payer: 59 | Admitting: Cardiology

## 2022-10-08 VITALS — BP 133/83 | HR 73 | Resp 14 | Ht 73.0 in | Wt 245.0 lb

## 2022-10-08 DIAGNOSIS — F411 Generalized anxiety disorder: Secondary | ICD-10-CM

## 2022-10-08 NOTE — Progress Notes (Signed)
Pt presents today for anxiety. Pt requesting another medication besides xanax. Pt has been taking at least  0.5 for couple of weeks. Due to having symptoms anxiety more often.

## 2022-10-08 NOTE — Progress Notes (Signed)
   Subjective: Medication request    Patient ID: Allen Wilson, male    DOB: 09/11/1995, 27 y.o.   MRN: [id number]  HPI Patient here today to discuss anxiety.  Patient states he is taking Xanax 0.25 mg that was prescribed by his psychiatrist.  Patient states this will not be a as needed medication but he notes he is taking it more often because of his anxiety.  Voices fear of addiction secondary to the nature of this medication.  Has not discussed his concern with his psychiatrist but has appointment for him in 2 weeks.  Requesting antianxiety medication that is not a benzodiazepine.   Review of Systems Anxiety/depression and  paroxysmal arterial fibrillation    Objective:   Physical Exam BP 133/83  Pulse 73  Resp 14  Weight 245 lb (111.1 kg)  Height 6\' 1"  (1.854 m)   BMI 32.32 kg/m2  BSA 2.39 m2  Exam deferred.       Assessment & Plan: Anxiety  Discussed tapering off of Xanax and starting Lexapro at 10 mg.  Advised patient to discuss this medication change with his treating psychiatrist.

## 2022-10-09 ENCOUNTER — Other Ambulatory Visit: Payer: Self-pay | Admitting: Physician Assistant

## 2022-10-09 MED ORDER — ESCITALOPRAM OXALATE 10 MG PO TABS: 10.0000 mg | ORAL_TABLET | Freq: Every day | ORAL | 0 refills | Status: DC

## 2022-10-10 ENCOUNTER — Other Ambulatory Visit: Payer: Self-pay

## 2022-10-10 ENCOUNTER — Emergency Department
Admission: EM | Admit: 2022-10-10 | Discharge: 2022-10-10 | Disposition: A | Discharge status: Home / Self Care | Payer: 59 | Source: Home / Self Care | Attending: Emergency Medicine | Admitting: Emergency Medicine

## 2022-10-10 DIAGNOSIS — R200 Anesthesia of skin: Secondary | ICD-10-CM | POA: Insufficient documentation

## 2022-10-10 DIAGNOSIS — R002 Palpitations: Secondary | ICD-10-CM

## 2022-10-10 DIAGNOSIS — R202 Paresthesia of skin: Secondary | ICD-10-CM | POA: Diagnosis not present

## 2022-10-10 DIAGNOSIS — I480 Paroxysmal atrial fibrillation: Secondary | ICD-10-CM

## 2022-10-10 DIAGNOSIS — I471 Supraventricular tachycardia, unspecified: Secondary | ICD-10-CM | POA: Diagnosis not present

## 2022-10-10 DIAGNOSIS — R000 Tachycardia, unspecified: Secondary | ICD-10-CM

## 2022-10-10 LAB — CBC WITH DIFFERENTIAL/PLATELET
Abs Immature Granulocytes: 0.04 10*3/uL (ref 0.00–0.07)
Basophils Absolute: 0 10*3/uL (ref 0.0–0.1)
Basophils Relative: 0 %
Eosinophils Absolute: 0 10*3/uL (ref 0.0–0.5)
Eosinophils Relative: 0 %
HCT: 45.5 % (ref 39.0–52.0)
Hemoglobin: 15.4 g/dL (ref 13.0–17.0)
Immature Granulocytes: 0 %
Lymphocytes Relative: 29 %
Lymphs Abs: 2.7 10*3/uL (ref 0.7–4.0)
MCH: 29.4 pg (ref 26.0–34.0)
MCHC: 33.8 g/dL (ref 30.0–36.0)
MCV: 86.8 fL (ref 80.0–100.0)
Monocytes Absolute: 0.6 10*3/uL (ref 0.1–1.0)
Monocytes Relative: 7 %
Neutro Abs: 5.8 10*3/uL (ref 1.7–7.7)
Neutrophils Relative %: 64 %
Platelets: 252 10*3/uL (ref 150–400)
RBC: 5.24 MIL/uL (ref 4.22–5.81)
RDW: 11.7 % (ref 11.5–15.5)
WBC: 9.3 10*3/uL (ref 4.0–10.5)
nRBC: 0 % (ref 0.0–0.2)

## 2022-10-10 LAB — COMPREHENSIVE METABOLIC PANEL
ALT: 47 U/L — ABNORMAL HIGH (ref 0–44)
AST: 28 U/L (ref 15–41)
Albumin: 4.2 g/dL (ref 3.5–5.0)
Alkaline Phosphatase: 66 U/L (ref 38–126)
Anion gap: 8 (ref 5–15)
BUN: 13 mg/dL (ref 6–20)
CO2: 26 mmol/L (ref 22–32)
Calcium: 9.2 mg/dL (ref 8.9–10.3)
Chloride: 102 mmol/L (ref 98–111)
Creatinine, Ser: 1.03 mg/dL (ref 0.61–1.24)
GFR, Estimated: >60 mL/min (ref >60)
Glucose, Bld: 114 mg/dL — ABNORMAL HIGH (ref 70–99)
Potassium: 3.4 mmol/L — ABNORMAL LOW (ref 3.5–5.1)
Sodium: 136 mmol/L (ref 135–145)
Total Bilirubin: 0.5 mg/dL (ref 0.3–1.2)
Total Protein: 7.6 g/dL (ref 6.5–8.1)

## 2022-10-10 LAB — MAGNESIUM: Magnesium: 2 mg/dL (ref 1.7–2.4)

## 2022-10-10 LAB — TROPONIN I (HIGH SENSITIVITY): Troponin I (High Sensitivity): 3 ng/L (ref <18)

## 2022-10-10 NOTE — ED Provider Notes (Signed)
[facility] Provider Note   Event Date/Time   First MD Initiated Contact with Patient 10/10/22 1707     (approximate) History  Tachycardia  HPI Allen Wilson is a 27 y.o. male with a stated past medical history of paroxysmal atrial fibrillation and paroxysmal SVT as well as anxiety presents via EMS with concerns for palpitations, and numbness/tingling in the left upper extremity as well as bilateral lower extremities.  Patient states that the symptoms began just prior to arrival and he took his normal dose of Xanax and diltiazem.  Patient states that heart rate has significantly improved however patient is still feeling intermittent sensations of paresthesias as well as the feeling that he is in a pass out. ROS: Patient currently denies any vision changes, tinnitus, difficulty speaking, facial droop, sore throat, shortness of breath, abdominal pain, nausea/vomiting/diarrhea, dysuria, or weakness/numbness/paresthesias in any extremity   Physical Exam  Triage Vital Signs: ED Triage Vitals [10/10/22 1706]  Encounter Vitals Group     BP      Systolic BP Percentile      Diastolic BP Percentile      Pulse      Resp      Temp      Temp src      SpO2 96 %     Weight      Height      Head Circumference      Peak Flow      Pain Score      Pain Loc      Pain Education      Exclude from Growth Chart    Most recent vital signs: Vitals:   10/10/22 1706 10/10/22 1710  BP:  (!) 141/90  Pulse:  87  Resp:  20  Temp:  98.3 F (36.8 C)  SpO2: 96% 99%   General: Awake, oriented x4. CV:  Good peripheral perfusion.  Resp:  Normal effort.  Abd:  No distention.  Other:  Young adult overweight Caucasian male resting comfortably in no acute distress ED Results / Procedures / Treatments  Labs (all labs ordered are listed, but only abnormal results are displayed) Labs Reviewed  COMPREHENSIVE METABOLIC PANEL - Abnormal; Notable for the following components:       Result Value   Potassium 3.4 (*)    Glucose, Bld 114 (*)    ALT 47 (*)    All other components within normal limits  CBC WITH DIFFERENTIAL/PLATELET  MAGNESIUM  TROPONIN I (HIGH SENSITIVITY)   EKG ED ECG REPORT I, Merwyn Katos, the attending physician, personally viewed and interpreted this ECG. Date: 10/10/2022 EKG Time: 1736 Rate: 99 Rhythm: normal sinus rhythm QRS Axis: normal Intervals: normal ST/T Wave abnormalities: normal Narrative Interpretation: no evidence of acute ischemia PROCEDURES: Critical Care performed: No .1-3 Lead EKG Interpretation  Performed by: Merwyn Katos, MD Authorized by: Merwyn Katos, MD     Interpretation: normal     ECG rate:  71   ECG rate assessment: normal     Rhythm: sinus rhythm     Ectopy: none     Conduction: normal    MEDICATIONS ORDERED IN ED: Medications - No data to display IMPRESSION / MDM / ASSESSMENT AND PLAN / ED COURSE  I reviewed the triage vital signs and the nursing notes.                             The  patient is on the cardiac monitor to evaluate for evidence of arrhythmia and/or significant heart rate changes. Patient's presentation is most consistent with acute presentation with potential threat to life or bodily function. 27 year old male presents with palpitations. EKG: No STEMI and no evidence of Brugada's sign, delta wave, epsilon wave, significantly prolonged QTc, or malignant arrhythmia. Based on H&P and testing, this patient appears to be low risk for emergent causes of palpitations such as, but not limited to, a malignant cardiac arrhythmia, ACS, pulmonary embolism, thyrotoxicosis, PNA, PTX.  The patient has been given strict return precautions and understands the need for further outpatient testing and treatment.   FINAL CLINICAL IMPRESSION(S) / ED DIAGNOSES   Final diagnoses:  Tachycardia  Palpitations  Paroxysmal SVT (supraventricular tachycardia)  Paroxysmal atrial fibrillation (HCC)    Rx / DC Orders   ED Discharge Orders          Ordered    Ambulatory referral to Cardiology       Comments: If you have not heard from the Cardiology office within the next 72 hours please call [phone].   10/10/22 1836           Note:  This document was prepared using Dragon voice recognition software and may include unintentional dictation errors.   Merwyn Katos, MD 10/10/22 [id number]

## 2022-10-10 NOTE — ED Triage Notes (Signed)
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 Pt A&Ox4, states that he has been dealing with the tachycardia for about a month and a half. Pt endorses feeling worse with this episode than he ever has. The last episode witnessed by EMS lasted roughly 30 minutes. Pt was given 4 mg zofran IV during transport.

## 2022-10-10 NOTE — ED Notes (Signed)
Pt endorsing feeling lightheaded accompanied by slight chest discomfort described as a nagging sensation.

## 2022-10-10 NOTE — ED Notes (Signed)
Pt A&O x4, no obvious distress noted, respirations regular/unlabored. Pt verbalizes understanding of discharge instructions. Pt able to ambulate from ED independently.   

## 2022-10-14 NOTE — Progress Notes (Unsigned)
Cardiology Office Note Date:  10/14/2022  Patient ID:  Neema, Nair Jun 19, 1995, MRN [id number] PCP:  Joni Reining, PA-C  Cardiologist:  None Electrophysiologist: Lanier Prude, MD    Chief Complaint: palpitations  History of Present Illness: Helen Buwalda is a 27 y.o. male with PMH notable for parox Afib, tachyarrhythmia, obesity, anxiety; seen today for Lanier Prude, MD for acute visit for increased palpitations.   Afib, SVT first diagnosed 07/2021. He was at work at [employer] when he felt off, co-workers put him on heart monitor and noted his HR was > 200. Vagal maneuvers unsuccessful > ER > adenosine x 3 was unsuccessful, but slowed rhythm and he transitioned to AFib.  He is status post ILR implant for further quantification of his SVT, A-fib.   Dr. Lalla Brothers and I have seen him several times over the past 6 months, I most recently saw him 09/25/2022.  He was tolerating 300 mg diltiazem well, having palpitation episodes frequently at work accompanied by whole body tingling and L arm numbness. At that visit, because his episodes were occurring at work, he was not giving PRN dilt adequate time to work.   On follow-up today, he has continued to have palpitations, almost daily. They are becoming more and more frustrating to him. Often accompanied with body tingling. No SOB, chest pain, chest pressure during episodes.  He has continued K supplement BID. H   Device Information: MDT ILR, imp 03/2022; arrhythmia   Past Medical History:  Diagnosis Date   Tachycardia     Past Surgical History:  Procedure Laterality Date   TYMPANOSTOMY TUBE PLACEMENT     childhood    Current Outpatient Medications  Medication Instructions   ALPRAZolam (XANAX) 0.25 mg, Oral, Daily PRN   ALPRAZolam (XANAX) 1 mg, Oral, Daily PRN   diltiazem (CARDIZEM CD) 300 mg, Oral, Daily   diltiazem (CARDIZEM) 30 MG tablet TAKE ONE TABLET BY MOUTH TWICE DAILY AS NEEDED (HR>100)    escitalopram (LEXAPRO) 10 mg, Oral, Daily   potassium chloride SA (KLOR-CON M) 20 MEQ tablet 20 mEq, Oral, 2 times daily   risperiDONE (RISPERDAL) 1 mg, Oral, Daily at bedtime   risperiDONE (RISPERDAL) 0.25 mg, Oral, As needed    Social History:  The patient  reports that he has never smoked. His smokeless tobacco use includes chew. He reports current alcohol use of about 5.0 standard drinks of alcohol per week. He reports that he does not use drugs.   Family History:  The patient's family history includes Arthritis/Rheumatoid in his mother; Heart attack in his maternal grandfather; Hypertension in his father; Other in his mother; Stroke in his maternal grandmother.  ROS:  Please see the history of present illness. All other systems are reviewed and otherwise negative.   PHYSICAL EXAM:  VS:  BP 136/80 (BP Location: Left Arm, Patient Position: Sitting, Cuff Size: Normal)   Pulse 72   Ht 6\' 1"  (1.854 m)   Wt 241 lb (109.3 kg)   SpO2 98%   BMI 31.80 kg/m  BMI: There is no height or weight on file to calculate BMI.  GEN- The patient is well appearing, alert and oriented x 3 today.   Lungs- Clear to ausculation bilaterally, normal work of breathing.  Heart- Regular rate and rhythm, no murmurs, rubs or gallops Extremities- No peripheral edema, warm, dry Skin-  ILR insertion site well-healed   MDT Carelink reviewed by myself:  Battery good Symptomatic episode yesterday appear sinus tach  EKG is not ordered. Personal review of EKG from   10/10/22 at 1658  shows:     SR w brief SVT, rate 72 PR QRS 97 QT 405   Recent Labs: 06/09/2022: TSH 1.650 10/10/2022: ALT 47; BUN 13; Creatinine, Ser 1.03; Hemoglobin 15.4; Magnesium 2.0; Platelets 252; Potassium 3.4; Sodium 136  06/09/2022: Chol/HDL Ratio 5.7; Cholesterol, Total 212; HDL 37; LDL Chol Calc (NIH) 122; Triglycerides 298   Estimated Creatinine Clearance: 146.8 mL/min (by C-G formula based on SCr of 1.03 mg/dL).   Wt Readings  from Last 3 Encounters:  10/10/22 261 lb 14.4 oz (118.8 kg)  10/08/22 245 lb (111.1 kg)  09/25/22 241 lb 4 oz (109.4 kg)     Additional studies reviewed include: Previous EP, cardiology notes.   Long term monitor, 10/16/2021 HR 39 - 180 bpm, average 78 bpm. Rare supraventricular and ventricular ectopy. No atrial fibrillation detected.  TTE, 08/08/2021  1. Left ventricular ejection fraction, by estimation, is 60 to 65%. The left ventricle has normal function. The left ventricle has no regional wall motion abnormalities. Left ventricular diastolic parameters were normal.   2. Right ventricular systolic function is normal. The right ventricular size is normal.   3. The mitral valve is normal in structure. No evidence of mitral valve regurgitation. No evidence of mitral stenosis.   4. The aortic valve was not well visualized. Aortic valve regurgitation is not visualized. No aortic stenosis is present.   5. The inferior vena cava is normal in size with greater than 50% respiratory variability, suggesting right atrial pressure of 3 mmHg.   ASSESSMENT AND PLAN:  #) parox Afib #) tachyarrhythmia S/p ILR insertion 0% AF burden by device Symptomatic atach on ILR in the past Continues to have symptomatic episodes interfering with work Continue 300mg  diltiazem daily Start 100mg  flecainide BID ETT in 7-10 days   #) hypokalemia Continues to be hypoK, interestingly his most symptomatic episodes of palpitations have correlated with low potassium Continue potassium BID   Current medicines are reviewed at length with the patient today.   The patient has concerns regarding his medicines.  The following changes were made today:   START flecainide 100mg  BID  Labs/ tests ordered today include:  Orders Placed This Encounter  Procedures   Exercise Tolerance Test     Disposition: Follow up with EP APP in 2-3 weeks after ETT   Signed, Sherie Don, NP  10/14/22  1:02 PM   Electrophysiology [facility]

## 2022-10-15 ENCOUNTER — Encounter: Payer: Self-pay | Admitting: Cardiology

## 2022-10-15 ENCOUNTER — Ambulatory Visit: Payer: 59 | Attending: Cardiology | Admitting: Cardiology

## 2022-10-15 VITALS — BP 136/80 | HR 72 | Ht 73.0 in | Wt 241.0 lb

## 2022-10-15 DIAGNOSIS — E876 Hypokalemia: Secondary | ICD-10-CM

## 2022-10-15 DIAGNOSIS — Z79899 Other long term (current) drug therapy: Secondary | ICD-10-CM | POA: Diagnosis not present

## 2022-10-15 DIAGNOSIS — I471 Supraventricular tachycardia, unspecified: Secondary | ICD-10-CM | POA: Diagnosis not present

## 2022-10-15 DIAGNOSIS — R000 Tachycardia, unspecified: Secondary | ICD-10-CM

## 2022-10-15 MED ORDER — FLECAINIDE ACETATE 100 MG PO TABS: 100.0000 mg | ORAL_TABLET | Freq: Two times a day (BID) | ORAL | 3 refills | Status: DC

## 2022-10-15 NOTE — Patient Instructions (Signed)
Medication Instructions:   START Flecainide - take one tablet ( 100mg ) by mouth twice a day.    *If you need a refill on your cardiac medications before your next appointment, please call your pharmacy*   Lab Work:  None Ordered  If you have labs (blood work) drawn today and your tests are completely normal, you will receive your results only by: MyChart Message (if you have MyChart) OR A paper copy in the mail If you have any lab test that is abnormal or we need to change your treatment, we will call you to review the results.   Testing/Procedures:  Exercise Stress Test - in 7 to 10 days  An exercise stress test is a test to check how your heart works during exercise and checks for coronary artery disease. Your heart rate will be watched while you are resting and while you are exercising.  Patches (electrodes) will be put on your chest. Do not put lotions, powders, creams, or oils on your chest before the test. Wires will be connected to the patches. The wires will send signals to a machine to record your heartbeat. Wear comfortable shoes and clothing. Follow instructions from your doctor about what you cannot eat or drink before the test.  Before the procedure Follow instructions from your doctor about what you cannot eat or drink. Do not have any drinks or foods that have caffeine in them for 24 hours before the test, or as told by your doctor. This includes coffee, tea (even decaf tea), sodas, chocolate, and cocoa. If you use an inhaler, bring it with you to the test. Do not use any products that have nicotine or tobacco in them, such as cigarettes and e-cigarettes. Stop using them at least 4 hours before the test. If you need help quitting, ask your doctor.   Follow-Up: At [facility], you and your health needs are our priority.  As part of our continuing mission to provide you with exceptional heart care, we have created designated Provider Care Teams.  These Care  Teams include your primary Cardiologist (physician) and Advanced Practice Providers (APPs -  Physician Assistants and Nurse Practitioners) who all work together to provide you with the care you need, when you need it.  We recommend signing up for the patient portal called "MyChart".  Sign up information is provided on this After Visit Summary.  MyChart is used to connect with patients for Virtual Visits (Telemedicine).  Patients are able to view lab/test results, encounter notes, upcoming appointments, etc.  Non-urgent messages can be sent to your provider as well.   To learn more about what you can do with MyChart, go to [email or web address].    Your next appointment:   2 week(s) after treadmill   Provider:   Sherie Don, NP

## 2022-10-23 ENCOUNTER — Ambulatory Visit (INDEPENDENT_AMBULATORY_CARE_PROVIDER_SITE_OTHER): Payer: 59

## 2022-10-23 DIAGNOSIS — R000 Tachycardia, unspecified: Secondary | ICD-10-CM | POA: Diagnosis not present

## 2022-10-27 ENCOUNTER — Emergency Department: Payer: 59

## 2022-10-27 ENCOUNTER — Other Ambulatory Visit: Payer: Self-pay

## 2022-10-27 ENCOUNTER — Emergency Department
Admission: EM | Admit: 2022-10-27 | Discharge: 2022-10-27 | Disposition: A | Discharge status: Home / Self Care | Payer: 59 | Attending: Emergency Medicine | Admitting: Emergency Medicine

## 2022-10-27 DIAGNOSIS — R002 Palpitations: Secondary | ICD-10-CM

## 2022-10-27 DIAGNOSIS — E876 Hypokalemia: Secondary | ICD-10-CM | POA: Insufficient documentation

## 2022-10-27 LAB — CBC
HCT: 47.8 % (ref 39.0–52.0)
Hemoglobin: 16 g/dL (ref 13.0–17.0)
MCH: 29.3 pg (ref 26.0–34.0)
MCHC: 33.5 g/dL (ref 30.0–36.0)
MCV: 87.5 fL (ref 80.0–100.0)
Platelets: 266 10*3/uL (ref 150–400)
RBC: 5.46 MIL/uL (ref 4.22–5.81)
RDW: 11.9 % (ref 11.5–15.5)
WBC: 10.2 10*3/uL (ref 4.0–10.5)
nRBC: 0 % (ref 0.0–0.2)

## 2022-10-27 LAB — BASIC METABOLIC PANEL
Anion gap: 11 (ref 5–15)
BUN: 16 mg/dL (ref 6–20)
CO2: 23 mmol/L (ref 22–32)
Calcium: 9.5 mg/dL (ref 8.9–10.3)
Chloride: 101 mmol/L (ref 98–111)
Creatinine, Ser: 0.89 mg/dL (ref 0.61–1.24)
GFR, Estimated: >60 mL/min (ref >60)
Glucose, Bld: 110 mg/dL — ABNORMAL HIGH (ref 70–99)
Potassium: 3.3 mmol/L — ABNORMAL LOW (ref 3.5–5.1)
Sodium: 135 mmol/L (ref 135–145)

## 2022-10-27 LAB — TROPONIN I (HIGH SENSITIVITY): Troponin I (High Sensitivity): <2 ng/L (ref <18)

## 2022-10-27 NOTE — ED Triage Notes (Addendum)
Pt to ED via POV from work. Pt reports irregular heart beat and SOB. Pt states heart monitor was reading between 60-120 with irregular rhythm. Pt reports was dx with afib last June and is on cardizem. Pt also reports hx of SVT. Pt reports recently placed on Flecainide.

## 2022-10-27 NOTE — ED Provider Notes (Signed)
[facility] Provider Note    Event Date/Time   First MD Initiated Contact with Patient 10/27/22 1630     (approximate)  History   Chief Complaint: Irregular Heart Beat  HPI  Allen Wilson is a 27 y.o. male with a past medical history of atrial fibrillation, irregular heartbeat, presents to the emergency department for irregular heartbeat and tachycardia.  According to the patient earlier today he was feeling like his heart was racing as well as irregular.  Patient has a portable heart monitor he took several reads of his heart which was tachycardic between 100 120 bpm per patient, and irregular.  Patient states he has been having some mild chest discomfort at times over the last 2 weeks and starting flecainide.  He also states he is on diltiazem daily.  Physical Exam   Triage Vital Signs: ED Triage Vitals  Encounter Vitals Group     BP 10/27/22 1317 (!) 148/80     Systolic BP Percentile --      Diastolic BP Percentile --      Pulse Rate 10/27/22 1316 77     Resp 10/27/22 1316 18     Temp 10/27/22 1316 98 F (36.7 C)     Temp Source 10/27/22 1316 Oral     SpO2 10/27/22 1316 97 %     Weight --      Height --      Head Circumference --      Peak Flow --      Pain Score 10/27/22 1317 2     Pain Loc --      Pain Education --      Exclude from Growth Chart --     Most recent vital signs: Vitals:   10/27/22 1316 10/27/22 1317  BP:  (!) 148/80  Pulse: 77   Resp: 18   Temp: 98 F (36.7 C)   SpO2: 97%     General: Awake, no distress.  CV:  Good peripheral perfusion.  Regular rate and rhythm around 80 bpm. Resp:  Normal effort.  Equal breath sounds bilaterally.  Abd:  No distention.  Soft, nontender.  No rebound or guarding.  ED Results / Procedures / Treatments   EKG  EKG viewed and interpreted by myself shows a normal sinus rhythm at 92 bpm with a narrow QRS, normal axis, normal intervals besides slight QTc prolongation,  nonspecific but no concerning ST changes.  RADIOLOGY  I have reviewed and interpreted chest x-ray images.  No consolidation on my evaluation.  Patient does have an implanted loop recorder. Radiologist read the x-ray is negative for acute process.   MEDICATIONS ORDERED IN ED: Medications - No data to display   IMPRESSION / MDM / ASSESSMENT AND PLAN / ED COURSE  I reviewed the triage vital signs and the nursing notes.  Patient's presentation is most consistent with acute presentation with potential threat to life or bodily function.  Patient presents emergency department for palpitations/heart racing/irregular sensation earlier today.  Currently the patient appears well, no distress, patient is in a normal sinus rhythm around 80 bpm.  I reviewed the patient's portable cardiac monitor rhythm strips he has on his cell phone from earlier today and they appear most consistent with sinus arrhythmia P waves are present before every QRS rate around 100 bpm.  Patient's lab work is reassuring including normal CBC reassuring chemistry slight hypokalemia at 3.3 however the patient is currently being treated for this and is on potassium  supplements twice daily.  Reassuringly negative troponin.  Given the patient's reassuring workup and the fact that he is in a normal sinus rhythm and has reassuring vitals I believe the patient is safe for discharge home with outpatient follow-up.  Patient has a stress test scheduled with his cardiologist this Friday.  FINAL CLINICAL IMPRESSION(S) / ED DIAGNOSES   Palpitations   Note:  This document was prepared using Dragon voice recognition software and may include unintentional dictation errors.   Minna Antis, MD 10/27/22 1700

## 2022-10-28 LAB — CUP PACEART REMOTE DEVICE CHECK
Date Time Interrogation Session: 20240909230737
Implantable Pulse Generator Implant Date: 20240228

## 2022-10-30 NOTE — Progress Notes (Signed)
Carelink Summary Report / Loop Recorder 

## 2022-10-31 ENCOUNTER — Telehealth: Payer: Self-pay | Admitting: Physician Assistant

## 2022-10-31 ENCOUNTER — Other Ambulatory Visit: Payer: Self-pay | Admitting: Internal Medicine

## 2022-10-31 ENCOUNTER — Ambulatory Visit
Admission: RE | Admit: 2022-10-31 | Discharge: 2022-10-31 | Disposition: A | Discharge status: Home / Self Care | Payer: 59 | Source: Ambulatory Visit | Attending: Cardiology | Admitting: Cardiology

## 2022-10-31 DIAGNOSIS — Z79899 Other long term (current) drug therapy: Secondary | ICD-10-CM

## 2022-10-31 LAB — EXERCISE TOLERANCE TEST
Angina Index: 0
Duke Treadmill Score: 11
Exercise duration (min): 11 min
Exercise duration (sec): 0 s
Peak HR: 169 {beats}/min
Percent HR: 87 %
Rest HR: 83 {beats}/min
ST Depression (mm): 0 mm

## 2022-10-31 MED ORDER — DILTIAZEM HCL ER COATED BEADS 300 MG PO CP24: 300.0000 mg | ORAL_CAPSULE | Freq: Every day | ORAL | 1 refills | Status: DC

## 2022-10-31 MED ORDER — POTASSIUM CHLORIDE CRYS ER 20 MEQ PO TBCR: 20.0000 meq | EXTENDED_RELEASE_TABLET | Freq: Two times a day (BID) | ORAL | 3 refills | Status: DC

## 2022-10-31 NOTE — Telephone Encounter (Signed)
Please call patient to reschedule 9/17 appt with me to see Dr. Lalla Brothers (MD ONLY),  OK to [facility] 8/25, ok to be virtual or in person appt whichever patient prefers

## 2022-10-31 NOTE — Telephone Encounter (Signed)
Patient requests sooner follow up with EP due to concern for adverse effects of flecainide. He reports chest pain, headache, tremor, and visual changes since starting medication. Please schedule patient with EP and notify patient of appointment. Thanks!

## 2022-10-31 NOTE — Progress Notes (Signed)
Patient called and needed refill on diltiazem and potassium. Sent Rx to [facility] in [place], [place].

## 2022-11-03 NOTE — Telephone Encounter (Signed)
Patient asks if he needs to stop the Flecainide. Per Sherie Don, NP, patient is OK to stop medication until he sees Dr. Lalla Brothers on 9/25

## 2022-11-04 ENCOUNTER — Other Ambulatory Visit: Payer: Self-pay

## 2022-11-04 ENCOUNTER — Ambulatory Visit: Payer: 59 | Admitting: Cardiology

## 2022-11-04 DIAGNOSIS — F411 Generalized anxiety disorder: Secondary | ICD-10-CM

## 2022-11-04 MED ORDER — ESCITALOPRAM OXALATE 10 MG PO TABS: 10.0000 mg | ORAL_TABLET | Freq: Every day | ORAL | 0 refills | Status: DC

## 2022-11-11 NOTE — Progress Notes (Unsigned)
Electrophysiology Office Follow up Visit Note:    Date:  11/12/2022   ID:  Allen Wilson, DOB [birth year], MRN [id number]  PCP:  Joni Reining, PA-C  [facility] Cardiologist:  Wilson  [facility] Electrophysiologist:  Lanier Prude, MD    Interval History:    Allen Wilson is a 27 y.o. male who presents for a follow up visit.   By me April 16, 2022.  Allen patient is more recently seeing Rosalita Chessman.  Allen patient continues to experience palpitations.  Flecainide was started at Allen October 15, 2022 appointment with Rosalita Chessman.  An exercise tolerance test was performed on October 31, 2022.  He recently stopped his flecainide due to headache and visual changes.  Today he tells me he continues to feel poorly.  He feels intermittent chest pain.  Pain occurs at rest and with exertion.  He reports some palpitations but his dominant concern today seems to be more Allen chest pain.  He has a family history of premature ischemic coronary disease and a grandfather who had his first MI in Allen 100s.  Today he tells me that he is "not sure" whether or not anxiety could be contributing.  It is a possibility.  He is taking Lexapro 10 mg by mouth once daily.  This prescription was started 1 month ago and his prescribing physician has inquired about going up to 20 mg by mouth once daily.     Past medical, surgical, social and family history were reviewed.  ROS:   Please see Allen history of present illness.    All other systems reviewed and are negative.  EKGs/Labs/Other Studies Reviewed:    Allen following studies were reviewed today:  October 31, 2022 exercise tolerance test No arrhythmias during stress No ischemic changes No ectopy on EKG strips       Physical Exam:    VS:  Ht 6\' 1"  (1.854 m)   Wt 248 lb (112.5 kg)   BMI 32.72 kg/m     Wt Readings from Last 3 Encounters:  11/12/22 248 lb (112.5 kg)  10/15/22 241 lb (109.3 kg)  10/10/22 261 lb 14.4 oz (118.8 kg)      GEN:  Well nourished, well developed in no acute distress CARDIAC: RRR, no murmurs, rubs, gallops RESPIRATORY:  Clear to auscultation without rales, wheezing or rhonchi       ASSESSMENT:    1. Palpitations   2. Anxiety   3. Morbid obesity (HCC)    PLAN:    In order of problems listed above:  #Palpitations #Anxiety Allen patient continues to report significant palpitations although I have not seen any sustained episodes of arrhythmia on his loop recorder.  All symptom triggered episodes on loop recorder monitoring correspond to sinus rhythm.  I suspect there is a significant element of anxiety contributing to his symptoms.  I think would be reasonable for him to increase his Lexapro to 20 mg by mouth once daily.  He will discuss this with Allen prescribing physician.  Given Allen low burden of arrhythmia on monitoring, I would not restart an alternative antiarrhythmic.  No invasive EP procedures are indicated this time.  Recommend close follow-up with primary care physician for anxiety treatment.  #Chest pain Does not sound ischemic but given his persistent symptoms and family history of premature cardiovascular disease, I have ordered a coronary CTA to exclude anomalous coronaries or premature obstructive disease.  #Obesity Weight loss encouraged  #Obstructive sleep apnea Did not tolerate CPAP.  I  will write a prescription today for a dental appliance.   Follow-up with APP in 1 year or sooner as needed  Signed, Steffanie Dunn, MD, [facility], [facility] 11/12/2022 9:38 AM    Electrophysiology [facility]

## 2022-11-12 ENCOUNTER — Telehealth: Payer: Self-pay | Admitting: Cardiology

## 2022-11-12 ENCOUNTER — Encounter: Payer: Self-pay | Admitting: Cardiology

## 2022-11-12 ENCOUNTER — Ambulatory Visit: Payer: 59 | Attending: Cardiology | Admitting: Cardiology

## 2022-11-12 VITALS — BP 122/86 | HR 74 | Ht 73.0 in | Wt 248.0 lb

## 2022-11-12 DIAGNOSIS — F419 Anxiety disorder, unspecified: Secondary | ICD-10-CM

## 2022-11-12 DIAGNOSIS — R072 Precordial pain: Secondary | ICD-10-CM | POA: Diagnosis not present

## 2022-11-12 DIAGNOSIS — R002 Palpitations: Secondary | ICD-10-CM

## 2022-11-12 DIAGNOSIS — G4733 Obstructive sleep apnea (adult) (pediatric): Secondary | ICD-10-CM

## 2022-11-12 NOTE — Telephone Encounter (Signed)
Left another message for them to call back

## 2022-11-12 NOTE — Telephone Encounter (Signed)
Left a message for the office to call back.

## 2022-11-12 NOTE — Telephone Encounter (Signed)
[facility] dental requested the pts sleep study from 11/2021 and referral for oral appliace... noted in his 07/2022 OV note:   #) Mild OSA per Levy Sjogren NP Encouraged him to pursue oral appliance with dentistry for ongoing sleep apnea treatment  Emailed to [email or web address]

## 2022-11-12 NOTE — Telephone Encounter (Signed)
Office calling to try and get the sleep study and a referral from Dr. Mayford Knife. Please advise

## 2022-11-12 NOTE — Patient Instructions (Signed)
Medication Instructions:  Your physician recommends that you continue on your current medications as directed. Please refer to the Current Medication list given to you today.  *If you need a refill on your cardiac medications before your next appointment, please call your pharmacy*  Lab Work: BMET  Testing/Procedures: Your physician has requested that you have cardiac CT. Cardiac computed tomography (CT) is a painless test that uses an x-ray machine to take clear, detailed pictures of your heart. For further information please visit [email or web address] Please follow instruction sheet as given.  Follow-Up: At [facility], you and your health needs are our priority.  As part of our continuing mission to provide you with exceptional heart care, we have created designated Provider Care Teams.  These Care Teams include your primary Cardiologist (physician) and Advanced Practice Providers (APPs -  Physician Assistants and Nurse Practitioners) who all work together to provide you with the care you need, when you need it.  Your next appointment:   6 months  Provider:   Sherie Don, NP

## 2022-11-12 NOTE — Telephone Encounter (Signed)
Caller Tresa Endo) stated she is returning RN's call.

## 2022-11-13 LAB — BASIC METABOLIC PANEL
BUN/Creatinine Ratio: 17 (ref 9–20)
BUN: 16 mg/dL (ref 6–20)
CO2: 22 mmol/L (ref 20–29)
Calcium: 10.2 mg/dL (ref 8.7–10.2)
Chloride: 101 mmol/L (ref 96–106)
Creatinine, Ser: 0.94 mg/dL (ref 0.76–1.27)
Glucose: 78 mg/dL (ref 70–99)
Potassium: 4.6 mmol/L (ref 3.5–5.2)
Sodium: 142 mmol/L (ref 134–144)
eGFR: 115 mL/min/{1.73_m2} (ref >59)

## 2022-11-14 ENCOUNTER — Encounter: Payer: 59 | Admitting: Cardiology

## 2022-11-24 ENCOUNTER — Ambulatory Visit (INDEPENDENT_AMBULATORY_CARE_PROVIDER_SITE_OTHER): Payer: 59

## 2022-11-24 DIAGNOSIS — I480 Paroxysmal atrial fibrillation: Secondary | ICD-10-CM | POA: Diagnosis not present

## 2022-11-24 LAB — CUP PACEART REMOTE DEVICE CHECK
Date Time Interrogation Session: 20241006231300
Implantable Pulse Generator Implant Date: 20240228

## 2022-11-25 ENCOUNTER — Encounter: Payer: 59 | Admitting: Cardiology

## 2022-11-27 DIAGNOSIS — R002 Palpitations: Secondary | ICD-10-CM | POA: Insufficient documentation

## 2022-12-02 ENCOUNTER — Telehealth (HOSPITAL_COMMUNITY): Payer: Self-pay | Admitting: Emergency Medicine

## 2022-12-02 DIAGNOSIS — R079 Chest pain, unspecified: Secondary | ICD-10-CM

## 2022-12-02 MED ORDER — METOPROLOL TARTRATE 100 MG PO TABS
100.0000 mg | ORAL_TABLET | Freq: Once | ORAL | 0 refills | Status: DC
Start: 2022-12-02 — End: 2023-01-31

## 2022-12-02 NOTE — Telephone Encounter (Signed)
Attempted to call patient regarding upcoming cardiac CT appointment. Left message on voicemail with name and callback number Rockwell Alexandria RN Navigator Cardiac Imaging [facility] [phone] Office [phone] Cell  Need to discuss moving patients ccta appt from [facility] to [facility]

## 2022-12-03 ENCOUNTER — Telehealth (HOSPITAL_COMMUNITY): Payer: Self-pay | Admitting: Emergency Medicine

## 2022-12-03 ENCOUNTER — Encounter (HOSPITAL_COMMUNITY): Payer: Self-pay

## 2022-12-03 NOTE — Telephone Encounter (Signed)
Reaching out to patient to offer assistance regarding upcoming cardiac imaging study; pt verbalizes understanding of appt date/time, parking situation and where to check in, pre-test NPO status and medications ordered, and verified current allergies; name and call back number provided for further questions should they arise Cayne Yom RN Navigator Cardiac Imaging [facility] [phone] office [phone] cell 

## 2022-12-04 ENCOUNTER — Ambulatory Visit
Admission: RE | Admit: 2022-12-04 | Discharge: 2022-12-04 | Disposition: A | Discharge status: Home / Self Care | Payer: 59 | Source: Ambulatory Visit | Attending: Cardiology | Admitting: Cardiology

## 2022-12-04 DIAGNOSIS — R072 Precordial pain: Secondary | ICD-10-CM | POA: Insufficient documentation

## 2022-12-04 MED ORDER — NITROGLYCERIN 0.4 MG SL SUBL
0.8000 mg | SUBLINGUAL_TABLET | Freq: Once | SUBLINGUAL | Status: AC
  Administered 2022-12-04: 0.8 mg via SUBLINGUAL

## 2022-12-04 MED ORDER — IOHEXOL 350 MG/ML SOLN
100.0000 mL | Freq: Once | INTRAVENOUS | Status: AC | PRN
  Administered 2022-12-04: 100 mL via INTRAVENOUS

## 2022-12-04 MED ORDER — SODIUM CHLORIDE 0.9 % IV SOLN: INTRAVENOUS | Status: DC

## 2022-12-04 MED ORDER — METOPROLOL TARTRATE 5 MG/5ML IV SOLN
10.0000 mg | Freq: Once | INTRAVENOUS | Status: AC
  Administered 2022-12-04: 10 mg via INTRAVENOUS

## 2022-12-04 NOTE — Progress Notes (Signed)
Pt tolerated procedure well with no issues. Pt ABCs intact. Pt denies any complaints. Pt encouraged to drink plenty of water throughout the day. Pt ambulatory with steady gait.

## 2022-12-05 ENCOUNTER — Telehealth: Payer: Self-pay | Admitting: Cardiology

## 2022-12-05 NOTE — Telephone Encounter (Signed)
Patient called and states he saw in myChart that his CT results were normal, and he is asking for a note to return to work with no restrictions. Please call to discuss.(Patient sees DR. Lalla Brothers and Sherie Don, NP)

## 2022-12-05 NOTE — Telephone Encounter (Signed)
Will reach out to [facility] and RN to review if okay to provide letter.   Thanks!   Note from CT:  Allen Wilson, RT on 12/04/2022 10:18 AM  Pt states he has been out of work due to A-fib symptoms and he is currently in sinus rhythm. 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 NKI No hx CA. Hx Loop Recorder in 03/2022.

## 2022-12-05 NOTE — Telephone Encounter (Signed)
Patient states he needs the letter by the end of next week.

## 2022-12-08 NOTE — Telephone Encounter (Signed)
Letter printed and placed in the office for patient pick up.

## 2022-12-08 NOTE — Telephone Encounter (Signed)
Spoke with the patient and advised that Dr. Lalla Brothers is okay with him returning to work without any restrictions. I have placed a letter in his MyChart. He would like to pick up a printed copy at the [place] office next Monday. Will send message to [facility] triage to assist as I will not be there this week.

## 2022-12-10 NOTE — Progress Notes (Signed)
Carelink Summary Report / Loop Recorder 

## 2022-12-25 ENCOUNTER — Ambulatory Visit (INDEPENDENT_AMBULATORY_CARE_PROVIDER_SITE_OTHER): Payer: 59

## 2022-12-25 DIAGNOSIS — I471 Supraventricular tachycardia, unspecified: Secondary | ICD-10-CM | POA: Diagnosis not present

## 2022-12-25 LAB — CUP PACEART REMOTE DEVICE CHECK
Date Time Interrogation Session: 20241106231105
Implantable Pulse Generator Implant Date: 20240228

## 2023-01-12 NOTE — Progress Notes (Signed)
Carelink Summary Report / Loop Recorder 

## 2023-01-13 ENCOUNTER — Other Ambulatory Visit: Payer: Self-pay

## 2023-01-13 NOTE — Progress Notes (Signed)
Pt presents today to complete random UDS and ETOH.   UDS pending with [facility].  ETOH cleared.

## 2023-01-26 ENCOUNTER — Ambulatory Visit: Payer: 59

## 2023-01-26 DIAGNOSIS — I480 Paroxysmal atrial fibrillation: Secondary | ICD-10-CM

## 2023-01-26 DIAGNOSIS — Z20822 Contact with and (suspected) exposure to covid-19: Secondary | ICD-10-CM | POA: Diagnosis not present

## 2023-01-26 DIAGNOSIS — J069 Acute upper respiratory infection, unspecified: Secondary | ICD-10-CM | POA: Diagnosis not present

## 2023-01-26 DIAGNOSIS — R509 Fever, unspecified: Secondary | ICD-10-CM | POA: Diagnosis not present

## 2023-01-27 LAB — CUP PACEART REMOTE DEVICE CHECK
Date Time Interrogation Session: 20241208231528
Implantable Pulse Generator Implant Date: 20240228

## 2023-01-30 ENCOUNTER — Other Ambulatory Visit: Payer: Self-pay

## 2023-01-30 ENCOUNTER — Observation Stay
Admission: EM | Admit: 2023-01-30 | Discharge: 2023-01-31 | Disposition: A | Discharge status: Home / Self Care | Payer: 59 | Attending: Internal Medicine | Admitting: Internal Medicine

## 2023-01-30 ENCOUNTER — Ambulatory Visit
Admission: EM | Admit: 2023-01-30 | Discharge: 2023-01-30 | Disposition: A | Discharge status: Home / Self Care | Payer: 59

## 2023-01-30 ENCOUNTER — Emergency Department: Payer: 59

## 2023-01-30 ENCOUNTER — Observation Stay: Payer: 59

## 2023-01-30 DIAGNOSIS — R000 Tachycardia, unspecified: Secondary | ICD-10-CM | POA: Diagnosis not present

## 2023-01-30 DIAGNOSIS — I480 Paroxysmal atrial fibrillation: Secondary | ICD-10-CM | POA: Diagnosis not present

## 2023-01-30 DIAGNOSIS — E669 Obesity, unspecified: Secondary | ICD-10-CM | POA: Insufficient documentation

## 2023-01-30 DIAGNOSIS — F17220 Nicotine dependence, chewing tobacco, uncomplicated: Secondary | ICD-10-CM | POA: Insufficient documentation

## 2023-01-30 DIAGNOSIS — R0602 Shortness of breath: Secondary | ICD-10-CM

## 2023-01-30 DIAGNOSIS — R058 Other specified cough: Secondary | ICD-10-CM | POA: Diagnosis not present

## 2023-01-30 DIAGNOSIS — F419 Anxiety disorder, unspecified: Secondary | ICD-10-CM | POA: Diagnosis not present

## 2023-01-30 DIAGNOSIS — J168 Pneumonia due to other specified infectious organisms: Secondary | ICD-10-CM | POA: Diagnosis not present

## 2023-01-30 DIAGNOSIS — Z1152 Encounter for screening for COVID-19: Secondary | ICD-10-CM | POA: Insufficient documentation

## 2023-01-30 DIAGNOSIS — J189 Pneumonia, unspecified organism: Principal | ICD-10-CM | POA: Diagnosis present

## 2023-01-30 DIAGNOSIS — A419 Sepsis, unspecified organism: Secondary | ICD-10-CM | POA: Diagnosis not present

## 2023-01-30 DIAGNOSIS — R7981 Abnormal blood-gas level: Secondary | ICD-10-CM

## 2023-01-30 DIAGNOSIS — J9601 Acute respiratory failure with hypoxia: Secondary | ICD-10-CM | POA: Insufficient documentation

## 2023-01-30 DIAGNOSIS — Z6832 Body mass index (BMI) 32.0-32.9, adult: Secondary | ICD-10-CM | POA: Diagnosis not present

## 2023-01-30 DIAGNOSIS — Z79899 Other long term (current) drug therapy: Secondary | ICD-10-CM | POA: Diagnosis not present

## 2023-01-30 DIAGNOSIS — R918 Other nonspecific abnormal finding of lung field: Secondary | ICD-10-CM | POA: Diagnosis not present

## 2023-01-30 DIAGNOSIS — R0989 Other specified symptoms and signs involving the circulatory and respiratory systems: Secondary | ICD-10-CM | POA: Diagnosis not present

## 2023-01-30 DIAGNOSIS — R509 Fever, unspecified: Secondary | ICD-10-CM | POA: Diagnosis present

## 2023-01-30 HISTORY — DX: Unspecified atrial fibrillation: I48.91

## 2023-01-30 LAB — RESP PANEL BY RT-PCR (RSV, FLU A&B, COVID)  RVPGX2
Influenza A by PCR: NEGATIVE
Influenza B by PCR: NEGATIVE
Resp Syncytial Virus by PCR: NEGATIVE
SARS Coronavirus 2 by RT PCR: NEGATIVE

## 2023-01-30 LAB — CBC
HCT: 44.6 % (ref 39.0–52.0)
Hemoglobin: 15 g/dL (ref 13.0–17.0)
MCH: 29.9 pg (ref 26.0–34.0)
MCHC: 33.6 g/dL (ref 30.0–36.0)
MCV: 88.8 fL (ref 80.0–100.0)
Platelets: 276 10*3/uL (ref 150–400)
RBC: 5.02 MIL/uL (ref 4.22–5.81)
RDW: 12.1 % (ref 11.5–15.5)
WBC: 12.6 10*3/uL — ABNORMAL HIGH (ref 4.0–10.5)
nRBC: 0 % (ref 0.0–0.2)

## 2023-01-30 LAB — LACTIC ACID, PLASMA: Lactic Acid, Venous: 1.1 mmol/L (ref 0.5–1.9)

## 2023-01-30 LAB — BASIC METABOLIC PANEL
Anion gap: 14 (ref 5–15)
BUN: 11 mg/dL (ref 6–20)
CO2: 21 mmol/L — ABNORMAL LOW (ref 22–32)
Calcium: 9.4 mg/dL (ref 8.9–10.3)
Chloride: 103 mmol/L (ref 98–111)
Creatinine, Ser: 1.12 mg/dL (ref 0.61–1.24)
GFR, Estimated: >60 mL/min (ref >60)
Glucose, Bld: 103 mg/dL — ABNORMAL HIGH (ref 70–99)
Potassium: 4.1 mmol/L (ref 3.5–5.1)
Sodium: 138 mmol/L (ref 135–145)

## 2023-01-30 LAB — TROPONIN I (HIGH SENSITIVITY): Troponin I (High Sensitivity): 5 ng/L (ref <18)

## 2023-01-30 MED ORDER — ESCITALOPRAM OXALATE 10 MG PO TABS
20.0000 mg | ORAL_TABLET | Freq: Every day | ORAL | Status: DC
  Administered 2023-01-31: 20 mg via ORAL
  Filled 2023-01-30: qty 2

## 2023-01-30 MED ORDER — SODIUM CHLORIDE 0.9 % IV SOLN
500.0000 mg | INTRAVENOUS | Status: DC
  Administered 2023-01-30 – 2023-01-31 (×2): 500 mg via INTRAVENOUS
  Filled 2023-01-30 (×2): qty 5

## 2023-01-30 MED ORDER — GUAIFENESIN ER 600 MG PO TB12: 1200.0000 mg | ORAL_TABLET | Freq: Two times a day (BID) | ORAL | Status: DC

## 2023-01-30 MED ORDER — RISPERIDONE 1 MG PO TABS: 1.0000 mg | ORAL_TABLET | Freq: Every day | ORAL | Status: DC

## 2023-01-30 MED ORDER — DILTIAZEM HCL ER COATED BEADS 300 MG PO CP24
300.0000 mg | ORAL_CAPSULE | Freq: Every day | ORAL | Status: DC
  Administered 2023-01-31: 300 mg via ORAL
  Filled 2023-01-30: qty 1

## 2023-01-30 MED ORDER — ONDANSETRON HCL 4 MG PO TABS: 4.0000 mg | ORAL_TABLET | Freq: Four times a day (QID) | ORAL | Status: DC | PRN

## 2023-01-30 MED ORDER — ALPRAZOLAM 0.25 MG PO TABS: 0.2500 mg | ORAL_TABLET | Freq: Every day | ORAL | Status: DC | PRN

## 2023-01-30 MED ORDER — ONDANSETRON HCL 4 MG/2ML IJ SOLN: 4.0000 mg | Freq: Four times a day (QID) | INTRAMUSCULAR | Status: DC | PRN

## 2023-01-30 MED ORDER — LACTATED RINGERS IV BOLUS
1000.0000 mL | Freq: Once | INTRAVENOUS | Status: AC
  Administered 2023-01-30: 1000 mL via INTRAVENOUS

## 2023-01-30 MED ORDER — BENZONATATE 100 MG PO CAPS: 200.0000 mg | ORAL_CAPSULE | Freq: Three times a day (TID) | ORAL | Status: DC | PRN

## 2023-01-30 MED ORDER — ENOXAPARIN SODIUM 60 MG/0.6ML IJ SOSY: 0.5000 mg/kg | PREFILLED_SYRINGE | INTRAMUSCULAR | Status: DC

## 2023-01-30 MED ORDER — SODIUM CHLORIDE 0.9 % IV SOLN
2.0000 g | INTRAVENOUS | Status: DC
  Administered 2023-01-30 – 2023-01-31 (×2): 2 g via INTRAVENOUS
  Filled 2023-01-30 (×2): qty 20

## 2023-01-30 MED ORDER — IPRATROPIUM-ALBUTEROL 0.5-2.5 (3) MG/3ML IN SOLN
3.0000 mL | Freq: Four times a day (QID) | RESPIRATORY_TRACT | Status: DC
  Administered 2023-01-30 – 2023-01-31 (×4): 3 mL via RESPIRATORY_TRACT
  Filled 2023-01-30 (×4): qty 3

## 2023-01-30 NOTE — ED Notes (Signed)
Patient transported to CT 

## 2023-01-30 NOTE — ED Notes (Signed)
Tech doing vitals and pt says he was just at urgent care, spo2 89 and didn't get above 91. He states has had a fever and coughing since 12/7. They told him that he should come here for a chest xray.

## 2023-01-30 NOTE — ED Provider Notes (Signed)
Allen Wilson    CSN: [id number] Arrival date & time: 01/30/23  0908      History   Chief Complaint Chief Complaint  Patient presents with   Cough   Nasal Congestion   Fever    HPI Allen Wilson is a 27 y.o. male.  Patient presents with 6-day history of productive cough, shortness of breath, headache, subjective fever.  No chest pain.  He has been treating his symptoms with Coricidin.  [facility] and treated with promethazine DM; he states he was negative for flu and COVID.  His symptoms are getting worse.  His medical history includes paroxysmal atrial fibrillation.  The history is provided by the patient and medical records.    Past Medical History:  Diagnosis Date   A-fib [facility])    Tachycardia     Patient Active Problem List   Diagnosis Date Noted   Paroxysmal atrial fibrillation (HCC) 08/12/2021   Cellulitis of left forearm 02/11/2020   History of depression 02/11/2020   Sepsis (HCC) 02/11/2020   Cellulitis of left arm 02/11/2020   Severe major depression, single episode (HCC) 02/16/2018   Poisoning by anticholinergic drug, intentional self-harm, initial encounter (HCC) 02/15/2018   Tachyarrhythmia 02/15/2018   Acute psychosis (HCC)    Drug overdose 09/17/2016    Past Surgical History:  Procedure Laterality Date   TYMPANOSTOMY TUBE PLACEMENT     childhood       Home Medications    Prior to Admission medications   Medication Sig Start Date End Date Taking? Authorizing Provider  flecainide (TAMBOCOR) 100 MG tablet Take 100 mg by mouth 2 (two) times daily. 01/20/23  Yes [provider]  promethazine-dextromethorphan (PROMETHAZINE-DM) 6.25-15 MG/5ML syrup Take by mouth. 01/26/23  Yes [provider]  ALPRAZolam (XANAX) 0.25 MG tablet Take 0.25 mg by mouth daily as needed. 05/15/21   [provider]  ALPRAZolam Prudy Feeler) 1 MG tablet Take 1 mg by mouth daily as needed. 09/05/22   [provider]  diltiazem (CARDIZEM CD) 300 MG 24 hr capsule Take 1 capsule (300 mg total) by mouth daily. 10/31/22   Joellen Jersey., MD  diltiazem (CARDIZEM) 30 MG tablet TAKE ONE TABLET BY MOUTH TWICE DAILY AS NEEDED (HR>100) 04/29/22   Lanier Prude, MD  escitalopram (LEXAPRO) 10 MG tablet Take 1 tablet (10 mg total) by mouth daily. 11/04/22   Joni Reining, PA-C  metoprolol tartrate (LOPRESSOR) 100 MG tablet Take 1 tablet (100 mg total) by mouth once for 1 dose. Please take one time dose 100mg  metoprolol tartrate 2 hr prior to cardiac CT for HR control IF HR >55bpm. 12/02/22 12/02/22  O'Neal, Ronnald Ramp, MD  potassium chloride SA (KLOR-CON M) 20 MEQ tablet Take 1 tablet (20 mEq total) by mouth 2 (two) times daily. 10/31/22   Joellen Jersey., MD  risperiDONE (RISPERDAL) 1 MG tablet Take 1 mg by mouth at bedtime. 09/05/22   [provider]    Family History Family History  Problem Relation Age of Onset   Other Mother        alive & well.   Arthritis/Rheumatoid Mother    Hypertension Father    Stroke Maternal Grandmother    Heart attack Maternal Grandfather        x 2 - first in his 78's.    Social History Social History   Tobacco Use   Smoking status: Never   Smokeless tobacco: Current  Types: Chew  Vaping Use   Vaping status: Never Used  Substance Use Topics   Alcohol use: Yes    Alcohol/week: 5.0 standard drinks of alcohol    Types: 5 Cans of beer per week   Drug use: No     Allergies   Patient has no known allergies.   Review of Systems Review of Systems  Constitutional:  Positive for fever.  HENT:  Negative for ear pain and sore throat.   Respiratory:  Positive for cough and shortness of breath.   Cardiovascular:  Negative for chest pain and palpitations.  Neurological:  Positive for headaches.     Physical Exam Triage Vital Signs ED Triage Vitals [01/30/23 0927]  Encounter Vitals Group     BP 116/80     Systolic BP Percentile       Diastolic BP Percentile      Pulse Rate (!) 102     Resp 20     Temp 99.3 F (37.4 C)     Temp src      SpO2 90 %     Weight      Height      Head Circumference      Peak Flow      Pain Score      Pain Loc      Pain Education      Exclude from Growth Chart    No data found.  Updated Vital Signs BP 116/80   Pulse 94   Temp 99.3 F (37.4 C)   Resp 20   SpO2 91%   Visual Acuity Right Eye Distance:   Left Eye Distance:   Bilateral Distance:    Right Eye Near:   Left Eye Near:    Bilateral Near:     Physical Exam Constitutional:      General: He is not in acute distress. HENT:     Right Ear: Tympanic membrane normal.     Left Ear: Tympanic membrane normal.     Mouth/Throat:     Mouth: Mucous membranes are moist.     Pharynx: Oropharynx is clear.  Cardiovascular:     Rate and Rhythm: Normal rate and regular rhythm.     Heart sounds: Normal heart sounds.  Pulmonary:     Effort: Pulmonary effort is normal.     Breath sounds: Rhonchi present.  Skin:    General: Skin is warm and dry.  Neurological:     Mental Status: He is alert.      UC Treatments / Results  Labs (all labs ordered are listed, but only abnormal results are displayed) Labs Reviewed - No data to display  EKG   Radiology No results found.  Procedures Procedures (including critical care time)  Medications Ordered in UC Medications - No data to display  Initial Impression / Assessment and Plan / UC Course  I have reviewed the triage vital signs and the nursing notes.  Pertinent labs & imaging results that were available during my care of the patient were reviewed by me and considered in my medical decision making (see chart for details).   Shortness of breath, borderline low oxygen level, productive cough, paroxysmal atrial fibrillation.  O2 sat 90% on room air.  Patient reports he feels short of breath.  Sending him to the ED for evaluation of his borderline low oxygen level and  shortness of breath with productive cough and medical history of paroxysmal atrial fibrillation.  He is agreeable to this.  He  drove himself here and feels stable to drive himself to [facility] ED.    Final Clinical Impressions(s) / UC Diagnoses   Final diagnoses:  Shortness of breath  Borderline low oxygen saturation level  Productive cough  Paroxysmal atrial fibrillation [facility])     Discharge Instructions      Go to the emergency department for evaluation of your borderline low oxygen level, shortness of breath, productive cough.     ED Prescriptions   None    PDMP not reviewed this encounter.   Mickie Bail, NP 01/30/23 [phone]

## 2023-01-30 NOTE — Progress Notes (Signed)
PHARMACIST - PHYSICIAN COMMUNICATION  CONCERNING:  Enoxaparin (Lovenox) for DVT Prophylaxis    RECOMMENDATION: Patient was prescribed enoxaprin 40mg  q24 hours for VTE prophylaxis.   Filed Weights   01/30/23 1002  Weight: 113.4 kg (250 lb)    Body mass index is 32.98 kg/m.  Estimated Creatinine Clearance: 131.9 mL/min (by C-G formula based on SCr of 1.12 mg/dL).   Based on [facility] policy patient is candidate for enoxaparin 0.5mg /kg TBW SQ every 24 hours based on BMI being >30.  DESCRIPTION: Pharmacy has adjusted enoxaparin dose per [facility] policy.  Patient is now receiving enoxaparin 57.5 mg every 24 hours    Rockwell Alexandria, PharmD Clinical Pharmacist  01/30/2023 12:35 PM

## 2023-01-30 NOTE — H&P (Signed)
History and Physical    Rod Venturino ION:[id number] DOB: [birth year] DOA: 01/30/2023  PCP: Joni Reining, PA-C (Confirm with patient/family/NH records and if not entered, this has to be entered at [facility] point of entry) Patient coming from: Home  I have personally briefly reviewed patient's old medical records in [facility]  Chief Complaint: Cough, SOB  HPI: Allen Wilson is a 27 y.o. male with medical history significant of PAF, anxiety, presented with persistent cough and shortness of breath.  Symptoms started 8-9 days ago, patient started develop headache followed by productive cough with occasional whitish phlegm production and subjective fever and chills.  Went to see urgent care last Saturday, was diagnosed with viral syndromes and sent home with OTC medications.  Gradually symptoms became worse and started develop exertional dyspnea overnight and came to hospital.  Patient reported that he has frequent bronchitis since he was young and this time, his girlfriend also has headache about the same time but she does not have cough or shortness of breath.  And her symptoms went away earlier this week.  ED Course: O2 saturation 91% on room air, desaturated to 88% with short ambulation.  Chest x-ray showed fullness increased density the left helium suspicious for pneumonitis.  Blood work showed WBC 12.  Patient was started on ceftriaxone and azithromycin in the ED    Past Medical History:  Diagnosis Date   A-fib (HCC)    Tachycardia     Past Surgical History:  Procedure Laterality Date   TYMPANOSTOMY TUBE PLACEMENT     childhood     reports that he has never smoked. His smokeless tobacco use includes chew. He reports current alcohol use of about 5.0 standard drinks of alcohol per week. He reports that he does not use drugs.  No Known Allergies  Family History  Problem Relation Age of Onset   Other Mother        alive & well.   Arthritis/Rheumatoid  Mother    Hypertension Father    Stroke Maternal Grandmother    Heart attack Maternal Grandfather        x 2 - first in his 31's.     Prior to Admission medications   Medication Sig Start Date End Date Taking? Authorizing Provider  ALPRAZolam (XANAX) 0.25 MG tablet Take 0.25 mg by mouth daily as needed. 05/15/21   [provider]  ALPRAZolam Prudy Feeler) 1 MG tablet Take 1 mg by mouth daily as needed. 09/05/22   [provider]  diltiazem (CARDIZEM CD) 300 MG 24 hr capsule Take 1 capsule (300 mg total) by mouth daily. 10/31/22   Joellen Jersey., MD  diltiazem (CARDIZEM) 30 MG tablet TAKE ONE TABLET BY MOUTH TWICE DAILY AS NEEDED (HR>100) 04/29/22   Lanier Prude, MD  escitalopram (LEXAPRO) 10 MG tablet Take 1 tablet (10 mg total) by mouth daily. 11/04/22   Joni Reining, PA-C  flecainide (TAMBOCOR) 100 MG tablet Take 100 mg by mouth 2 (two) times daily. 01/20/23   [provider]  metoprolol tartrate (LOPRESSOR) 100 MG tablet Take 1 tablet (100 mg total) by mouth once for 1 dose. Please take one time dose 100mg  metoprolol tartrate 2 hr prior to cardiac CT for HR control IF HR >55bpm. 12/02/22 12/02/22  O'Neal, Ronnald Ramp, MD  potassium chloride SA (KLOR-CON M) 20 MEQ tablet Take 1 tablet (20 mEq total) by mouth 2 (two) times daily. 10/31/22   Joellen Jersey., MD  promethazine-dextromethorphan (PROMETHAZINE-DM) 6.25-15  MG/5ML syrup Take by mouth. 01/26/23   [provider]  risperiDONE (RISPERDAL) 1 MG tablet Take 1 mg by mouth at bedtime. 09/05/22   [provider]    Physical Exam: Vitals:   01/30/23 1113 01/30/23 1115 01/30/23 1130 01/30/23 1200  BP: 126/79 126/79 125/73 126/72  Pulse: 95 94 92 (!) 116  Resp: 17     Temp:      TempSrc:      SpO2: 97% 97% 95% 97%  Weight:      Height:        Constitutional: NAD, calm, comfortable Vitals:   01/30/23 1113 01/30/23 1115 01/30/23 1130 01/30/23 1200  BP: 126/79 126/79 125/73 126/72   Pulse: 95 94 92 (!) 116  Resp: 17     Temp:      TempSrc:      SpO2: 97% 97% 95% 97%  Weight:      Height:       Eyes: PERRL, lids and conjunctivae normal ENMT: Mucous membranes are moist. Posterior pharynx clear of any exudate or lesions.Normal dentition.  Neck: normal, supple, no masses, no thyromegaly Respiratory: clear to auscultation bilaterally, no wheezing, no crackles. Normal respiratory effort. No accessory muscle use.  Cardiovascular: Regular rate and rhythm, no murmurs / rubs / gallops. No extremity edema. 2+ pedal pulses. No carotid bruits.  Abdomen: no tenderness, no masses palpated. No hepatosplenomegaly. Bowel sounds positive.  Musculoskeletal: no clubbing / cyanosis. No joint deformity upper and lower extremities. Good ROM, no contractures. Normal muscle tone.  Skin: no rashes, lesions, ulcers. No induration Neurologic: CN 2-12 grossly intact. Sensation intact, DTR normal. Strength 5/5 in all 4.  Psychiatric: Normal judgment and insight. Alert and oriented x 3. Normal mood.     Labs on Admission: I have personally reviewed following labs and imaging studies  CBC: Recent Labs  Lab 01/30/23 1004  WBC 12.6*  HGB 15.0  HCT 44.6  MCV 88.8  PLT 276   Basic Metabolic Panel: Recent Labs  Lab 01/30/23 1004  NA 138  K 4.1  CL 103  CO2 21*  GLUCOSE 103*  BUN 11  CREATININE 1.12  CALCIUM 9.4   GFR: Estimated Creatinine Clearance: 131.9 mL/min (by C-G formula based on SCr of 1.12 mg/dL). Liver Function Tests: No results for input(s): "AST", "ALT", "ALKPHOS", "BILITOT", "PROT", "ALBUMIN" in the last 168 hours. No results for input(s): "LIPASE", "AMYLASE" in the last 168 hours. No results for input(s): "AMMONIA" in the last 168 hours. Coagulation Profile: No results for input(s): "INR", "PROTIME" in the last 168 hours. Cardiac Enzymes: No results for input(s): "CKTOTAL", "CKMB", "CKMBINDEX", "TROPONINI" in the last 168 hours. BNP (last 3 results) No results  for input(s): "PROBNP" in the last 8760 hours. HbA1C: No results for input(s): "HGBA1C" in the last 72 hours. CBG: No results for input(s): "GLUCAP" in the last 168 hours. Lipid Profile: No results for input(s): "CHOL", "HDL", "LDLCALC", "TRIG", "CHOLHDL", "LDLDIRECT" in the last 72 hours. Thyroid Function Tests: No results for input(s): "TSH", "T4TOTAL", "FREET4", "T3FREE", "THYROIDAB" in the last 72 hours. Anemia Panel: No results for input(s): "VITAMINB12", "FOLATE", "FERRITIN", "TIBC", "IRON", "RETICCTPCT" in the last 72 hours. Urine analysis:    Component Value Date/Time   COLORURINE YELLOW (A) 09/17/2016 1644   APPEARANCEUR CLEAR (A) 09/17/2016 1644   LABSPEC 1.012 09/17/2016 1644   PHURINE 7.0 09/17/2016 1644   GLUCOSEU NEGATIVE 09/17/2016 1644   HGBUR NEGATIVE 09/17/2016 1644   BILIRUBINUR neg 06/09/2022 0940   KETONESUR NEGATIVE 09/17/2016  1644   PROTEINUR Negative 06/09/2022 0940   PROTEINUR NEGATIVE 09/17/2016 1644   UROBILINOGEN 0.2 06/09/2022 0940   NITRITE neg 06/09/2022 0940   NITRITE NEGATIVE 09/17/2016 1644   LEUKOCYTESUR Negative 06/09/2022 0940    Radiological Exams on Admission: DG Chest 2 View Result Date: 01/30/2023 CLINICAL DATA:  Shortness of breath.  Subjective fever. EXAM: CHEST - 2 VIEW COMPARISON:  10/27/2022. FINDINGS: Low lung volume. There is mild asymmetric fullness/density of the left hilum in comparison to the right and in comparison to the prior exams. In appropriate clinical setting, findings may represent focal consolidation/pneumonitis. Correlate clinically to determine the need for additional imaging with chest CT scan. Alternatively, if clinically indicated, follow-up radiograph can be obtained in 4 weeks to document resolution. Bilateral lung fields are otherwise clear. Bilateral costophrenic angles are clear. Normal cardio-mediastinal silhouette. Loop recorder device noted overlying the left anterior chest wall. No acute osseous  abnormalities. The soft tissues are within normal limits. IMPRESSION: *There is asymmetric fullness/increased density of the left hilum, which in the appropriate clinical setting, may represent focal consolidation/pneumonitis. Correlate clinically. Electronically Signed   By: Jules Schick M.D.   On: 01/30/2023 11:08    EKG: Independently reviewed.  Sinus tachycardia, no acute ST changes.  Assessment/Plan Principal Problem:   PNA (pneumonia) Active Problems:   CAP (community acquired pneumonia)  (please populate well all problems here in Problem List. (For example, if patient is on BP meds at home and you resume or decide to hold them, it is a problem that needs to be her. Same for CAD, COPD, HLD and so on)  Acute hypoxic respiratory failure CAP, bacterial, left ilium/lower lobe SIRS -Continue ceftriaxone and azithromycin -Given that the patient has history of frequent bronchitis in the past, with severe hypoxia, will check CT chest to rule out other etiologies such as bronchiectasis -Supportive care, incentive spirometry, flutter valve -Check atypical pneumonia study, Legionella and mycoplasma -DuoNebs -Ambulation pulse ox in the morning, if stable likely can go home tomorrow.  PAF -No acute concern, continue Cardizem -Used to be on flecainide which was discontinued due to side effect of severe headache and chest pains  Anxiety -Continue as needed Xanax and SSRI  DVT prophylaxis: Lovenox Code Status: Full code Family Communication: Significant other at bedside Disposition Plan: Expect less than 2 midnight hospital stay Consults called: None Admission status: Telemetry observation   Emeline General MD [facility] Pager [phone]  01/30/2023, 12:34 PM

## 2023-01-30 NOTE — Consult Note (Signed)
CODE SEPSIS - PHARMACY COMMUNICATION  **Broad-spectrum antimicrobials should be administered within one hour of sepsis diagnosis**  Time Code Sepsis call or page was received: 1202  Antibiotics ordered: Ceftriaxone, Azithromycin  Time of first antibiotic administration: 1219  Additional action taken by pharmacy: N/A  If necessary, name of provider/nurse contacted: N/A    Will M. Dareen Piano, PharmD Clinical Pharmacist 01/30/2023 12:04 PM

## 2023-01-30 NOTE — Progress Notes (Signed)
Elink following for sepsis protocol. 

## 2023-01-30 NOTE — ED Notes (Signed)
This tech, and trainee, Alexia, walked with pt around the unit. His oxygen dropped down to 88-90 the whole time.

## 2023-01-30 NOTE — ED Triage Notes (Signed)
Patient to Urgent Care with complaints of fevers (unsure of max temp)/ clear & productive cough/ headache.  Symptoms started 6 days ago. Negative for Covid and Flu on Monday @ [facility].  Taking promethazine-DM/ Coricidin HBP/ tylenol.   Sats 91% RA during triage. Hx of afib.

## 2023-01-30 NOTE — Discharge Instructions (Signed)
Go to the emergency department for evaluation of your borderline low oxygen level, shortness of breath, productive cough.

## 2023-01-30 NOTE — ED Provider Notes (Signed)
[facility] Provider Note    Event Date/Time   First MD Initiated Contact with Patient 01/30/23 1106     (approximate)   History   Fever and hypoxia   HPI  Allen Wilson is a 27 y.o. male with paroxysmal SVT who comes in with concerns for cough, shortness of breath, subjective fever.  Patient had the symptoms for about 6 days.  He was seen in urgent care on 12/9 with negative flu and COVID testing and treated with promethazine.  Patient was seen in urgent care today and noted to have an oxygen level of 90% so sent here for further evaluation.  Patient reports that he has been having cough, subjective fevers.  He has not been on any antibiotics.  Because of his low oxygen levels he was sent here for evaluation.  He denies any risk factors for pulmonary embolism.  Physical Exam   Triage Vital Signs: ED Triage Vitals  Encounter Vitals Group     BP 01/30/23 0957 135/88     Systolic BP Percentile --      Diastolic BP Percentile --      Pulse Rate 01/30/23 0957 (!) 108     Resp 01/30/23 0957 18     Temp 01/30/23 0957 98.6 F (37 C)     Temp Source 01/30/23 0957 Oral     SpO2 01/30/23 0957 91 %     Weight 01/30/23 1002 250 lb (113.4 kg)     Height 01/30/23 1002 6\' 1"  (1.854 m)     Head Circumference --      Peak Flow --      Pain Score 01/30/23 1002 0     Pain Loc --      Pain Education --      Exclude from Growth Chart --     Most recent vital signs: Vitals:   01/30/23 0957  BP: 135/88  Pulse: (!) 108  Resp: 18  Temp: 98.6 F (37 C)  SpO2: 91%     General: Awake, no distress.  CV:  Good peripheral perfusion.  Resp:  Normal effort.  Clear lungs Abd:  No distention.  Other:  No swelling legs.  No calf tenderness   ED Results / Procedures / Treatments   Labs (all labs ordered are listed, but only abnormal results are displayed) Labs Reviewed  CBC - Abnormal; Notable for the following components:      Result Value   WBC 12.6  (*)    All other components within normal limits  BASIC METABOLIC PANEL - Abnormal; Notable for the following components:   CO2 21 (*)    Glucose, Bld 103 (*)    All other components within normal limits     EKG  My interpretation of EKG:  Sinus tachycardia rate of 105 without any ST elevation, T wave version lead III, normal intervals.  Patient had similar T wave inversion  RADIOLOGY I have reviewed the xray personally and interpreted and concerning for left-sided pneumonia  PROCEDURES:  Critical Care performed: Yes, see critical care procedure note(s)  .Critical Care  Performed by: Concha Se, MD Authorized by: Concha Se, MD   Critical care provider statement:    Critical care time (minutes):  30   Critical care was time spent personally by me on the following activities:  Development of treatment plan with patient or surrogate, discussions with consultants, evaluation of patient's response to treatment, examination of patient, ordering and review of  laboratory studies, ordering and review of radiographic studies, ordering and performing treatments and interventions, pulse oximetry, re-evaluation of patient's condition and review of old charts    MEDICATIONS ORDERED IN ED: Medications  cefTRIAXone (ROCEPHIN) 2 g in sodium chloride 0.9 % 100 mL IVPB (has no administration in time range)  azithromycin (ZITHROMAX) 500 mg in sodium chloride 0.9 % 250 mL IVPB (has no administration in time range)  lactated ringers bolus 1,000 mL (1,000 mLs Intravenous New Bag/Given 01/30/23 1151)     IMPRESSION / MDM / ASSESSMENT AND PLAN / ED COURSE  I reviewed the triage vital signs and the nursing notes.   Patient's presentation is most consistent with acute presentation with potential threat to life or bodily function.  Differential is flu, COVID, pneumonia.  Patient oxygen level is 89-91% on room air.  When he ambulates he desats down to 88%.  He also reports having some  coughing episodes where he has passed out.  I troponin was negative doubt myocarditis  BMP is normal.  CBC shows elevated white count.  Patient meets sepsis criteria.  Blood cultures, lactate were ordered.  Patient was given some IV fluids IV antibiotics for presumed pneumonia.  X-ray does confirm pneumonia.  Considered pulmonary embolism but no signs of DVT and no risk factors and given his cough with fever this seems most consistent with pneumonia.  We discussed outpatient treatment versus inpatient treatment but given his oxygen levels at 88% patient will be admitted and placed on 2 L.    FINAL CLINICAL IMPRESSION(S) / ED DIAGNOSES   Final diagnoses:  Pneumonia due to infectious organism, unspecified laterality, unspecified part of lung  Sepsis, due to unspecified organism, unspecified whether acute organ dysfunction present [facility])     Rx / DC Orders   ED Discharge Orders     None        Note:  This document was prepared using Dragon voice recognition software and may include unintentional dictation errors.   Concha Se, MD 01/30/23 [phone]

## 2023-01-30 NOTE — ED Triage Notes (Signed)
Pt to ED from [facility] for SpO2 91%. +shob. Reports fevers but has not been taking temperature. Sx started Saturday. States tested neg for covid flu.  NAD noted 91% RA in triage. Placed on 2 L Day.

## 2023-01-31 DIAGNOSIS — J189 Pneumonia, unspecified organism: Secondary | ICD-10-CM | POA: Diagnosis not present

## 2023-01-31 DIAGNOSIS — I480 Paroxysmal atrial fibrillation: Secondary | ICD-10-CM

## 2023-01-31 DIAGNOSIS — E669 Obesity, unspecified: Secondary | ICD-10-CM | POA: Insufficient documentation

## 2023-01-31 DIAGNOSIS — J9601 Acute respiratory failure with hypoxia: Secondary | ICD-10-CM | POA: Insufficient documentation

## 2023-01-31 LAB — HIV ANTIBODY (ROUTINE TESTING W REFLEX): HIV Screen 4th Generation wRfx: NONREACTIVE

## 2023-01-31 LAB — CBC
HCT: 40.8 % (ref 39.0–52.0)
Hemoglobin: 13.8 g/dL (ref 13.0–17.0)
MCH: 30 pg (ref 26.0–34.0)
MCHC: 33.8 g/dL (ref 30.0–36.0)
MCV: 88.7 fL (ref 80.0–100.0)
Platelets: 265 10*3/uL (ref 150–400)
RBC: 4.6 MIL/uL (ref 4.22–5.81)
RDW: 12.1 % (ref 11.5–15.5)
WBC: 9 10*3/uL (ref 4.0–10.5)
nRBC: 0 % (ref 0.0–0.2)

## 2023-01-31 MED ORDER — AZITHROMYCIN 500 MG PO TABS: 500.0000 mg | ORAL_TABLET | Freq: Every day | ORAL | 0 refills | Status: DC

## 2023-01-31 MED ORDER — POTASSIUM CHLORIDE CRYS ER 20 MEQ PO TBCR
20.0000 meq | EXTENDED_RELEASE_TABLET | Freq: Two times a day (BID) | ORAL | Status: DC
Start: 2023-01-31 — End: 2023-01-31
  Administered 2023-01-31: 20 meq via ORAL
  Filled 2023-01-31: qty 1

## 2023-01-31 MED ORDER — CEFDINIR 300 MG PO CAPS: 300.0000 mg | ORAL_CAPSULE | Freq: Two times a day (BID) | ORAL | 0 refills | Status: DC

## 2023-01-31 MED ORDER — AZITHROMYCIN 500 MG PO TABS: 500.0000 mg | ORAL_TABLET | Freq: Every day | ORAL | 0 refills | Status: AC

## 2023-01-31 MED ORDER — CEFDINIR 300 MG PO CAPS: 300.0000 mg | ORAL_CAPSULE | Freq: Two times a day (BID) | ORAL | 0 refills | Status: AC

## 2023-01-31 NOTE — Hospital Course (Signed)
Allen Wilson is a 27 y.o. male with medical history significant of PAF, anxiety, presented with persistent cough and shortness of breath.  Patient also developed hypoxemia with oxygen saturation dropped down to 88%. Chest CT scan showed left lower lobe pneumonia.  Patient was started on oxygen, IV antibiotics with Rocephin and Zithromax.  Blood cultures so far no growth.

## 2023-01-31 NOTE — Progress Notes (Signed)
  Progress Note   Patient: Allen Wilson ZOX:[id number] DOB: 21-Jul-1995 DOA: 01/30/2023     0 DOS: the patient was seen and examined on 01/31/2023   Brief hospital course: Osmin Akridge is a 27 y.o. male with medical history significant of PAF, anxiety, presented with persistent cough and shortness of breath.  Patient also developed hypoxemia with oxygen saturation dropped down to 88%. Chest CT scan showed left lower lobe pneumonia.  Patient was started on oxygen, IV antibiotics with Rocephin and Zithromax.  Blood cultures so far no growth.   Principal Problem:   PNA (pneumonia) Active Problems:   Paroxysmal atrial fibrillation (HCC)   CAP (community acquired pneumonia)   Acute hypoxemic respiratory failure (HCC)   Obesity (BMI 30-39.9)   Assessment and Plan: Left lower lobe community-acquired pneumonia. Acute hypoxemic respiratory failure Sepsis secondary to pneumonia. Patient had typical presentation of pneumonia, he had a fever and cough last week.  Started have shortness of breath and a worsening cough for the last 2 or 3 days.  CT scan showed left lower lobe pneumonia which I personally reviewed the images and agree. Blood cultures so far negative. Patient also had short of breath and hypoxemia due to congestive heart failure.  He also met sepsis criteria at admission with leukocytosis and tachycardia.  Lactic acid level was normal. Patient has been treated with Rocephin and Zithromax, I will continue for another day in the hospital.  Likely discharge home tomorrow if patient no longer need oxygen.  Paroxysmal atrial fibrillation. Currently in sinus, on diltiazem.  Obesity.  BMI 32.98. Diet exercise advised.      Subjective:  Patient still required 2 L oxygen, with short of breath with exertion.  Physical Exam: Vitals:   01/31/23 0200 01/31/23 0234 01/31/23 0400 01/31/23 0700  BP: 127/75  116/72 118/68  Pulse: 73  65 63  Resp: (!) 21  20 18    Temp:  98.3 F (36.8 C) 98.1 F (36.7 C) 98 F (36.7 C)  TempSrc:  Oral Oral Oral  SpO2: 98%  96% 98%  Weight:      Height:       General exam: Appears calm and comfortable  Respiratory system: Clear to auscultation. Respiratory effort normal. Cardiovascular system: S1 & S2 heard, RRR. No JVD, murmurs, rubs, gallops or clicks. No pedal edema. Gastrointestinal system: Abdomen is nondistended, soft and nontender. No organomegaly or masses felt. Normal bowel sounds heard. Central nervous system: Alert and oriented. No focal neurological deficits. Extremities: Symmetric 5 x 5 power. Skin: No rashes, lesions or ulcers Psychiatry: Judgement and insight appear normal. Mood & affect appropriate.    Data Reviewed:  Reviewed CT scan results and images, lab results.  Family Communication: Mother updated at bedside.  Disposition: Status is: Observation      Time spent: 35 minutes  Author: Marrion Coy, MD 01/31/2023 11:04 AM  For on call review [email or web address].

## 2023-01-31 NOTE — Discharge Summary (Signed)
Physician Discharge Summary   Patient: Allen Wilson MRN: [id number] DOB: 1995-05-06  Admit date:     01/30/2023  Discharge date: 01/31/23  Discharge Physician: Marrion Coy   PCP: Joni Reining, PA-C   Recommendations at discharge:   Follow-up with PCP in 1 week.  Discharge Diagnoses: Principal Problem:   PNA (pneumonia) Active Problems:   Paroxysmal atrial fibrillation (HCC)   CAP (community acquired pneumonia)   Acute hypoxemic respiratory failure (HCC)   Obesity (BMI 30-39.9)  Resolved Problems:   * No resolved hospital problems. *  Hospital Course: Allen Wilson is a 27 y.o. male with medical history significant of PAF, anxiety, presented with persistent cough and shortness of breath.  Patient also developed hypoxemia with oxygen saturation dropped down to 88%. Chest CT scan showed left lower lobe pneumonia.  Patient was started on oxygen, IV antibiotics with Rocephin and Zithromax.  Blood cultures so far no growth.  Assessment and Plan: Left lower lobe community-acquired pneumonia. Acute hypoxemic respiratory failure Sepsis secondary to pneumonia. Patient had typical presentation of pneumonia, he had a fever and cough last week.  Started have shortness of breath and a worsening cough for the last 2 or 3 days.  CT scan showed left lower lobe pneumonia which I personally reviewed the images and agree. Blood cultures so far negative. Patient also had short of breath and hypoxemia due to congestive heart failure.  He also met sepsis criteria at admission with leukocytosis and tachycardia.  Lactic acid level was normal. Patient has been treated with Rocephin and Zithromax. Condition had improved, short of breath eventually resolved.  Off oxygen with good saturation.  At this point, patient is medically stable for discharge.  Patient will continue total of 5 days antibiotics.  Follow-up with PCP in 1 week.   Paroxysmal atrial fibrillation. Currently in  sinus, on diltiazem.   Obesity.  BMI 32.98. Diet exercise advised.        Consultants: None Procedures performed: None  Disposition: Home Diet recommendation:  Discharge Diet Orders (From admission, onward)     Start     Ordered   01/31/23 0000  Diet - low sodium heart healthy        01/31/23 1341           Cardiac diet DISCHARGE MEDICATION: Allergies as of 01/31/2023   No Known Allergies      Medication List     STOP taking these medications    metoprolol tartrate 100 MG tablet Commonly known as: LOPRESSOR       TAKE these medications    ALPRAZolam 0.5 MG tablet Commonly known as: XANAX Take 0.5 mg by mouth daily as needed for anxiety.   azithromycin 500 MG tablet Commonly known as: Zithromax Take 1 tablet (500 mg total) by mouth daily for 3 days.   cefdinir 300 MG capsule Commonly known as: OMNICEF Take 1 capsule (300 mg total) by mouth 2 (two) times daily for 3 days.   diltiazem 30 MG tablet Commonly known as: CARDIZEM TAKE ONE TABLET BY MOUTH TWICE DAILY AS NEEDED (HR>100)   diltiazem 300 MG 24 hr capsule Commonly known as: Cardizem CD Take 1 capsule (300 mg total) by mouth daily.   escitalopram 20 MG tablet Commonly known as: LEXAPRO Take 20 mg by mouth daily.   flecainide 100 MG tablet Commonly known as: TAMBOCOR Take 100 mg by mouth 2 (two) times daily.   potassium chloride SA 20 MEQ tablet Commonly known as: Jerene Dilling  Take 1 tablet (20 mEq total) by mouth 2 (two) times daily.   promethazine-dextromethorphan 6.25-15 MG/5ML syrup Commonly known as: PROMETHAZINE-DM Take by mouth.   risperiDONE 1 MG tablet Commonly known as: RISPERDAL Take 1 mg by mouth at bedtime.        Follow-up Information     Joni Reining, PA-C Follow up in 1 week(s).   Specialty: Physician Assistant Contact information: [address] [place] [address] [phone]                Discharge Exam: Allen Wilson Weights   01/30/23 1002   Weight: 113.4 kg   General exam: Appears calm and comfortable  Respiratory system: Clear to auscultation. Respiratory effort normal. Cardiovascular system: Relatively regular. No JVD, murmurs, rubs, gallops or clicks. No pedal edema. Gastrointestinal system: Abdomen is nondistended, soft and nontender. No organomegaly or masses felt. Normal bowel sounds heard. Central nervous system: Alert and oriented. No focal neurological deficits. Extremities: Symmetric 5 x 5 power. Skin: No rashes, lesions or ulcers Psychiatry: Judgement and insight appear normal. Mood & affect appropriate.    Condition at discharge: good  The results of significant diagnostics from this hospitalization (including imaging, microbiology, ancillary and laboratory) are listed below for reference.   Imaging Studies: CT CHEST WO CONTRAST Result Date: 01/30/2023 CLINICAL DATA:  Fever and cough. EXAM: CT CHEST WITHOUT CONTRAST TECHNIQUE: Multidetector CT imaging of the chest was performed following the standard protocol without IV contrast. RADIATION DOSE REDUCTION: This exam was performed according to the departmental dose-optimization program which includes automated exposure control, adjustment of the mA and/or kV according to patient size and/or use of iterative reconstruction technique. COMPARISON:  Chest x-ray, same date. FINDINGS: Cardiovascular: The heart is normal in size. No pericardial effusion. The aorta is normal in caliber. Mediastinum/Nodes: Small scattered mediastinal and hilar lymph nodes but no mass or overt adenopathy. Residual thymic tissue noted in the anterior mediastinum which is normal. Lungs/Pleura: Bilateral lower lobe pulmonary infiltrates with the most significant consolidation in superior segment of the left lower lobe. No pleural effusions. No pulmonary lesions. The central tracheobronchial tree is unremarkable. Upper Abdomen: No significant upper abdominal findings. Musculoskeletal: No significant  bony findings. IMPRESSION: 1. Bilateral lower lobe pulmonary infiltrates with the most significant consolidation in the superior segment of the left lower lobe. 2. No pleural effusions. 3. Small scattered mediastinal and hilar lymph nodes but no mass or overt adenopathy. Electronically Signed   By: Rudie Meyer M.D.   On: 01/30/2023 14:20   DG Chest 2 View Result Date: 01/30/2023 CLINICAL DATA:  Shortness of breath.  Subjective fever. EXAM: CHEST - 2 VIEW COMPARISON:  10/27/2022. FINDINGS: Low lung volume. There is mild asymmetric fullness/density of the left hilum in comparison to the right and in comparison to the prior exams. In appropriate clinical setting, findings may represent focal consolidation/pneumonitis. Correlate clinically to determine the need for additional imaging with chest CT scan. Alternatively, if clinically indicated, follow-up radiograph can be obtained in 4 weeks to document resolution. Bilateral lung fields are otherwise clear. Bilateral costophrenic angles are clear. Normal cardio-mediastinal silhouette. Loop recorder device noted overlying the left anterior chest wall. No acute osseous abnormalities. The soft tissues are within normal limits. IMPRESSION: *There is asymmetric fullness/increased density of the left hilum, which in the appropriate clinical setting, may represent focal consolidation/pneumonitis. Correlate clinically. Electronically Signed   By: Jules Schick M.D.   On: 01/30/2023 11:08   CUP PACEART REMOTE DEVICE CHECK Result Date: 01/27/2023  ILR summary report received. Battery status OK. Normal device function. No new symptom, tachy, brady, or pause episodes. No new AF episodes. Monthly summary reports and ROV/PRN LA, CVRS   Microbiology: Results for orders placed or performed during the hospital encounter of 01/30/23  Resp panel by RT-PCR (RSV, Flu A&B, Covid) Anterior Nasal Swab     Status: None   Collection Time: 01/30/23 11:15 AM   Specimen: Anterior  Nasal Swab  Result Value Ref Range Status   SARS Coronavirus 2 by RT PCR NEGATIVE NEGATIVE Final    Comment: (NOTE) SARS-CoV-2 target nucleic acids are NOT DETECTED.  The SARS-CoV-2 RNA is generally detectable in upper respiratory specimens during the acute phase of infection. The lowest concentration of SARS-CoV-2 viral copies this assay can detect is 138 copies/mL. A negative result does not preclude SARS-Cov-2 infection and should not be used as the sole basis for treatment or other patient management decisions. A negative result may occur with  improper specimen collection/handling, submission of specimen other than nasopharyngeal swab, presence of viral mutation(s) within the areas targeted by this assay, and inadequate number of viral copies(<138 copies/mL). A negative result must be combined with clinical observations, patient history, and epidemiological information. The expected result is Negative.  Fact Sheet for Patients:  BloggerCourse.com  Fact Sheet for Healthcare Providers:  SeriousBroker.it  This test is no t yet approved or cleared by the [place] FDA and  has been authorized for detection and/or diagnosis of SARS-CoV-2 by FDA under an Emergency Use Authorization (EUA). This EUA will remain  in effect (meaning this test can be used) for the duration of the COVID-19 declaration under Section 564(b)(1) of the Act, 21 U.S.C.section 360bbb-3(b)(1), unless the authorization is terminated  or revoked sooner.       Influenza A by PCR NEGATIVE NEGATIVE Final   Influenza B by PCR NEGATIVE NEGATIVE Final    Comment: (NOTE) The Xpert Xpress SARS-CoV-2/FLU/RSV plus assay is intended as an aid in the diagnosis of influenza from Nasopharyngeal swab specimens and should not be used as a sole basis for treatment. Nasal washings and aspirates are unacceptable for Xpert Xpress SARS-CoV-2/FLU/RSV testing.  Fact Sheet for  Patients: BloggerCourse.com  Fact Sheet for Healthcare Providers: SeriousBroker.it  This test is not yet approved or cleared by the [place] FDA and has been authorized for detection and/or diagnosis of SARS-CoV-2 by FDA under an Emergency Use Authorization (EUA). This EUA will remain in effect (meaning this test can be used) for the duration of the COVID-19 declaration under Section 564(b)(1) of the Act, 21 U.S.C. section 360bbb-3(b)(1), unless the authorization is terminated or revoked.     Resp Syncytial Virus by PCR NEGATIVE NEGATIVE Final    Comment: (NOTE) Fact Sheet for Patients: BloggerCourse.com  Fact Sheet for Healthcare Providers: SeriousBroker.it  This test is not yet approved or cleared by the [place] FDA and has been authorized for detection and/or diagnosis of SARS-CoV-2 by FDA under an Emergency Use Authorization (EUA). This EUA will remain in effect (meaning this test can be used) for the duration of the COVID-19 declaration under Section 564(b)(1) of the Act, 21 U.S.C. section 360bbb-3(b)(1), unless the authorization is terminated or revoked.  Performed at [facility], [address] [place], [place] [address]   Blood culture (routine x 2)     Status: None (Preliminary result)   Collection Time: 01/30/23 11:58 AM   Specimen: BLOOD  Result Value Ref Range Status   Specimen Description BLOOD BLOOD  RIGHT ARM  Final   Special Requests   Final    BOTTLES DRAWN AEROBIC AND ANAEROBIC Blood Culture adequate volume   Culture   Final    NO GROWTH < 24 HOURS Performed at [facility], [address] [place], [place] [address]    Report Status PENDING  Incomplete  Blood culture (routine x 2)     Status: None (Preliminary result)   Collection Time: 01/30/23 11:58 AM   Specimen: BLOOD  Result Value Ref Range Status   Specimen  Description BLOOD BLOOD LEFT HAND  Final   Special Requests   Final    BOTTLES DRAWN AEROBIC AND ANAEROBIC Blood Culture adequate volume   Culture   Final    NO GROWTH < 24 HOURS Performed at [facility], [address] [place], [place] [address]    Report Status PENDING  Incomplete    Labs: CBC: Recent Labs  Lab 01/30/23 1004 01/31/23 0205  WBC 12.6* 9.0  HGB 15.0 13.8  HCT 44.6 40.8  MCV 88.8 88.7  PLT 276 265   Basic Metabolic Panel: Recent Labs  Lab 01/30/23 1004  NA 138  K 4.1  CL 103  CO2 21*  GLUCOSE 103*  BUN 11  CREATININE 1.12  CALCIUM 9.4   Liver Function Tests: No results for input(s): "AST", "ALT", "ALKPHOS", "BILITOT", "PROT", "ALBUMIN" in the last 168 hours. CBG: No results for input(s): "GLUCAP" in the last 168 hours.  Discharge time spent: greater than 30 minutes.  Signed: Marrion Coy, MD [facility] 01/31/2023

## 2023-02-02 ENCOUNTER — Encounter: Payer: Self-pay | Admitting: Physician Assistant

## 2023-02-02 ENCOUNTER — Ambulatory Visit: Payer: Self-pay | Admitting: Physician Assistant

## 2023-02-02 VITALS — BP 129/91 | HR 109 | Temp 98.3°F | Resp 20

## 2023-02-02 DIAGNOSIS — Z7689 Persons encountering health services in other specified circumstances: Secondary | ICD-10-CM

## 2023-02-02 NOTE — Progress Notes (Signed)
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

## 2023-02-02 NOTE — Progress Notes (Signed)
   Subjective: Pneumonia    Patient ID: Allen Wilson, male    DOB: September 11, 1995, 27 y.o.   MRN: [id number]  HPI Patient here today for return to work evaluation secondary to being hospitalized for pneumonia.  Patient was released on 01/30/2023 from hospital.  Patient treating physician advised he may return back to work on 02/03/2023.  Patient states minimal to treatment plan.  Voices concern for mild fatigue.   Review of Systems Atrial fibrillation    Objective:   Physical Exam BP 129/91BP. 129/91. Data is abnormal. Taken on 02/02/23 2:50 PM  BP Location Left Arm  Patient Position Sitting  Cuff Size Large  Pulse Rate 109Pulse Rate. 109. Data is abnormal. Taken on 02/02/23 2:50 PM  Temp 98.3 F (36.8 C)  Temp Source Oral  Resp 20  SpO2 97 %  No acute distress. HEENT is unremarkable. Neck is supple lymphadenopathy or bruits. Lungs are clear to auscultation. Heart is tachycardic at 108 bpm.       Assessment & Plan: Return to work evaluation  Patient may return to work on the indicated and follow-up if condition worsens.

## 2023-02-03 LAB — MYCOPLASMA PNEUMONIAE ANTIBODY, IGM: Mycoplasma pneumo IgM: <770 U/mL (ref 0–769)

## 2023-02-04 LAB — CULTURE, BLOOD (ROUTINE X 2)
Culture: NO GROWTH
Culture: NO GROWTH
Special Requests: ADEQUATE
Special Requests: ADEQUATE

## 2023-02-09 ENCOUNTER — Ambulatory Visit: Payer: 59 | Admitting: Physician Assistant

## 2023-02-24 ENCOUNTER — Ambulatory Visit: Payer: Self-pay | Admitting: Physician Assistant

## 2023-02-24 DIAGNOSIS — E6609 Other obesity due to excess calories: Secondary | ICD-10-CM | POA: Diagnosis not present

## 2023-02-24 DIAGNOSIS — E66811 Obesity, class 1: Secondary | ICD-10-CM | POA: Diagnosis not present

## 2023-02-24 DIAGNOSIS — I480 Paroxysmal atrial fibrillation: Secondary | ICD-10-CM | POA: Diagnosis not present

## 2023-02-24 DIAGNOSIS — Z5689 Other problems related to employment: Secondary | ICD-10-CM

## 2023-02-24 DIAGNOSIS — R002 Palpitations: Secondary | ICD-10-CM | POA: Diagnosis not present

## 2023-02-24 DIAGNOSIS — Z6832 Body mass index (BMI) 32.0-32.9, adult: Secondary | ICD-10-CM | POA: Diagnosis not present

## 2023-02-24 NOTE — Progress Notes (Signed)
   Subjective:Cardiac ablation    Patient ID: Allen Wilson, male    DOB: 1995/07/27, 28 y.o.   MRN: [id number]  HPI Patient was to discuss upcoming cardiac ablation.  Patient is scheduled to start taking Eliquis 2 times a day on March 09, 2023.  It is recommended that the patient begin light duty around that date   Review of Systems A-fib and tachycardia    Objective:   Physical Exam Physical exam deferred       Assessment & Plan: Pending cardiac abrasion  Patient will start light duty 05 March 2023 until released by cardiologist.

## 2023-02-24 NOTE — Progress Notes (Signed)
 Pt presents today for consult on light duty due to being put on blood thinners prior to surgery coming up. 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

## 2023-02-26 ENCOUNTER — Ambulatory Visit (INDEPENDENT_AMBULATORY_CARE_PROVIDER_SITE_OTHER): Payer: 59

## 2023-02-26 DIAGNOSIS — I480 Paroxysmal atrial fibrillation: Secondary | ICD-10-CM

## 2023-02-26 LAB — CUP PACEART REMOTE DEVICE CHECK
Date Time Interrogation Session: 20250108231231
Implantable Pulse Generator Implant Date: 20240228

## 2023-03-05 NOTE — Addendum Note (Signed)
Addended by: Geralyn Flash D on: 03/05/2023 02:48 PM   Modules accepted: Orders

## 2023-03-05 NOTE — Progress Notes (Signed)
Carelink Summary Report / Loop Recorder 

## 2023-03-30 ENCOUNTER — Ambulatory Visit: Payer: 59

## 2023-03-30 DIAGNOSIS — I480 Paroxysmal atrial fibrillation: Secondary | ICD-10-CM

## 2023-04-01 LAB — CUP PACEART REMOTE DEVICE CHECK
Date Time Interrogation Session: 20250211083920
Implantable Pulse Generator Implant Date: 20240228

## 2023-04-02 ENCOUNTER — Encounter: Payer: Self-pay | Admitting: Cardiology

## 2023-04-03 DIAGNOSIS — Z01818 Encounter for other preprocedural examination: Secondary | ICD-10-CM | POA: Diagnosis not present

## 2023-04-03 DIAGNOSIS — G4733 Obstructive sleep apnea (adult) (pediatric): Secondary | ICD-10-CM | POA: Insufficient documentation

## 2023-04-03 DIAGNOSIS — I480 Paroxysmal atrial fibrillation: Secondary | ICD-10-CM | POA: Diagnosis not present

## 2023-04-08 NOTE — Progress Notes (Signed)
 [facility] Summary Report / Loop Recorder

## 2023-04-09 DIAGNOSIS — R000 Tachycardia, unspecified: Secondary | ICD-10-CM | POA: Diagnosis not present

## 2023-04-09 DIAGNOSIS — Z01810 Encounter for preprocedural cardiovascular examination: Secondary | ICD-10-CM | POA: Diagnosis not present

## 2023-04-09 DIAGNOSIS — J9809 Other diseases of bronchus, not elsewhere classified: Secondary | ICD-10-CM | POA: Diagnosis not present

## 2023-04-09 DIAGNOSIS — I480 Paroxysmal atrial fibrillation: Secondary | ICD-10-CM | POA: Diagnosis not present

## 2023-04-09 DIAGNOSIS — Z7901 Long term (current) use of anticoagulants: Secondary | ICD-10-CM | POA: Diagnosis not present

## 2023-04-10 DIAGNOSIS — Z720 Tobacco use: Secondary | ICD-10-CM | POA: Diagnosis not present

## 2023-04-10 DIAGNOSIS — Z9989 Dependence on other enabling machines and devices: Secondary | ICD-10-CM | POA: Diagnosis not present

## 2023-04-10 DIAGNOSIS — G4733 Obstructive sleep apnea (adult) (pediatric): Secondary | ICD-10-CM | POA: Diagnosis not present

## 2023-04-10 DIAGNOSIS — I480 Paroxysmal atrial fibrillation: Secondary | ICD-10-CM | POA: Diagnosis not present

## 2023-04-10 DIAGNOSIS — I4819 Other persistent atrial fibrillation: Secondary | ICD-10-CM | POA: Diagnosis not present

## 2023-04-25 ENCOUNTER — Emergency Department

## 2023-04-25 ENCOUNTER — Emergency Department
Admission: EM | Admit: 2023-04-25 | Discharge: 2023-04-25 | Disposition: A | Discharge status: Home / Self Care | Attending: Emergency Medicine | Admitting: Emergency Medicine

## 2023-04-25 ENCOUNTER — Other Ambulatory Visit: Payer: Self-pay

## 2023-04-25 DIAGNOSIS — Z7901 Long term (current) use of anticoagulants: Secondary | ICD-10-CM | POA: Insufficient documentation

## 2023-04-25 DIAGNOSIS — M79671 Pain in right foot: Secondary | ICD-10-CM | POA: Diagnosis not present

## 2023-04-25 DIAGNOSIS — M25571 Pain in right ankle and joints of right foot: Secondary | ICD-10-CM | POA: Diagnosis not present

## 2023-04-25 DIAGNOSIS — R000 Tachycardia, unspecified: Secondary | ICD-10-CM | POA: Diagnosis not present

## 2023-04-25 DIAGNOSIS — M7989 Other specified soft tissue disorders: Secondary | ICD-10-CM | POA: Diagnosis not present

## 2023-04-25 LAB — CBC WITH DIFFERENTIAL/PLATELET
Abs Immature Granulocytes: 0.04 10*3/uL (ref 0.00–0.07)
Basophils Absolute: 0.1 10*3/uL (ref 0.0–0.1)
Basophils Relative: 1 %
Eosinophils Absolute: 0 10*3/uL (ref 0.0–0.5)
Eosinophils Relative: 0 %
HCT: 41.8 % (ref 39.0–52.0)
Hemoglobin: 14.3 g/dL (ref 13.0–17.0)
Immature Granulocytes: 0 %
Lymphocytes Relative: 30 %
Lymphs Abs: 3.1 10*3/uL (ref 0.7–4.0)
MCH: 29.5 pg (ref 26.0–34.0)
MCHC: 34.2 g/dL (ref 30.0–36.0)
MCV: 86.2 fL (ref 80.0–100.0)
Monocytes Absolute: 0.9 10*3/uL (ref 0.1–1.0)
Monocytes Relative: 9 %
Neutro Abs: 6.3 10*3/uL (ref 1.7–7.7)
Neutrophils Relative %: 60 %
Platelets: 253 10*3/uL (ref 150–400)
RBC: 4.85 MIL/uL (ref 4.22–5.81)
RDW: 12 % (ref 11.5–15.5)
WBC: 10.4 10*3/uL (ref 4.0–10.5)
nRBC: 0 % (ref 0.0–0.2)

## 2023-04-25 LAB — BASIC METABOLIC PANEL
Anion gap: 8 (ref 5–15)
BUN: 16 mg/dL (ref 6–20)
CO2: 24 mmol/L (ref 22–32)
Calcium: 9.1 mg/dL (ref 8.9–10.3)
Chloride: 103 mmol/L (ref 98–111)
Creatinine, Ser: 0.94 mg/dL (ref 0.61–1.24)
GFR, Estimated: >60 mL/min (ref >60)
Glucose, Bld: 105 mg/dL — ABNORMAL HIGH (ref 70–99)
Potassium: 3.4 mmol/L — ABNORMAL LOW (ref 3.5–5.1)
Sodium: 135 mmol/L (ref 135–145)

## 2023-04-25 LAB — TYPE AND SCREEN
ABO/RH(D): O POS
Antibody Screen: NEGATIVE

## 2023-04-25 LAB — URIC ACID: Uric Acid, Serum: 7 mg/dL (ref 3.7–8.6)

## 2023-04-25 LAB — LACTIC ACID, PLASMA: Lactic Acid, Venous: 1.3 mmol/L (ref 0.5–1.9)

## 2023-04-25 LAB — SEDIMENTATION RATE: Sed Rate: 22 mm/h — ABNORMAL HIGH (ref 0–15)

## 2023-04-25 MED ORDER — MORPHINE SULFATE (PF) 4 MG/ML IV SOLN
4.0000 mg | Freq: Once | INTRAVENOUS | Status: AC
  Administered 2023-04-25: 4 mg via INTRAVENOUS
  Filled 2023-04-25: qty 1

## 2023-04-25 MED ORDER — OXYCODONE HCL 5 MG PO TABS: 5.0000 mg | ORAL_TABLET | Freq: Four times a day (QID) | ORAL | 0 refills | Status: AC | PRN

## 2023-04-25 MED ORDER — KETOROLAC TROMETHAMINE 15 MG/ML IJ SOLN
15.0000 mg | Freq: Once | INTRAMUSCULAR | Status: AC
Start: 2023-04-25 — End: 2023-04-25
  Administered 2023-04-25: 15 mg via INTRAVENOUS
  Filled 2023-04-25: qty 1

## 2023-04-25 NOTE — ED Triage Notes (Signed)
 POV from home/ right foot pain/ pt concerned of blood clot/ pt had ablation d/t a-fib 2 weeks ago/ taking eliquis/ pt using crutches for support

## 2023-04-25 NOTE — Discharge Instructions (Addendum)
 You were seen in the emergency department for pain in your right ankle.  You had an ultrasound done that did not show any signs of a blood clot.  You had an x-ray done that did not show any significant joint swelling, fracture or dislocation.  Your lab work was normal today.  You had a good pulse in your foot.  Apply ice, elevate your foot above your heart to help with swelling.  You can bear weight as tolerated.  Call and follow-up closely with your primary care doctor.  Return to the emergency department if you have any ongoing or worsening symptoms.  Pain control:  Acetaminophen (tylenol) - You can take 2 extra strength tablets (1000 mg) every 6 hours as needed for pain/fever.  If you are still having severe pain after taking Tylenol you can take 1 oxycodone. Make sure you eat food/drink water when taking these medications.  You were given a prescription for narcotic pain medications.  Take only if in severe pain.  These are very addictive medications.  These medications can make you constipated.  If you need to take more than 1-2 doses, start a stool softner.  If you become constipated, take 1 capfull of MiraLAX, can repeat untill having regular bowel movements.  Keep this medication out of reach of any children.  Thank you for choosing [place] for your health care, it was my pleasure to care for you today!  Corena Herter, MD

## 2023-04-25 NOTE — ED Provider Notes (Signed)
 [facility] Provider Note    Event Date/Time   First MD Initiated Contact with Patient 04/25/23 0701     (approximate)   History   Foot Pain   HPI  Chance Munter is a 28 y.o. male past medical history significant for atrial fibrillation on Eliquis with recent ablation who presents to the emergency department with right foot and ankle pain.  Patient states on Wednesday he started having some pain to his right foot and ankle.  Denies any trauma or injury.  Progressively worsening since that time to the point where it is too painful to bear weight and he is needing to use crutches.  Complaining of some tingling and paresthesias to his right foot.  Feels like the swelling is going up to his thigh.  Denies fever or chills.  2 weeks ago had an ablation at [facility] for atrial fibrillation that was diagnosed in 2023.  Has been on Tylenol and ibuprofen for pain control.  No history of gout or pseudogout.     Physical Exam   Triage Vital Signs: ED Triage Vitals  Encounter Vitals Group     BP 04/25/23 0636 131/76     Systolic BP Percentile --      Diastolic BP Percentile --      Pulse Rate 04/25/23 0636 (!) 102     Resp 04/25/23 0636 18     Temp 04/25/23 0636 98.8 F (37.1 C)     Temp Source 04/25/23 0636 Oral     SpO2 04/25/23 0636 98 %     Weight 04/25/23 0640 261 lb (118.4 kg)     Height --      Head Circumference --      Peak Flow --      Pain Score 04/25/23 0640 7     Pain Loc --      Pain Education --      Exclude from Growth Chart --     Most recent vital signs: Vitals:   04/25/23 0900 04/25/23 0930  BP: (!) 104/51 (!) 103/55  Pulse: 66 66  Resp: 14 16  Temp:    SpO2: 95% 97%    Physical Exam Constitutional:      Appearance: He is well-developed.  HENT:     Head: Atraumatic.  Eyes:     Conjunctiva/sclera: Conjunctivae normal.  Cardiovascular:     Rate and Rhythm: Regular rhythm.  Pulmonary:     Effort: No respiratory distress.   Abdominal:     General: There is no distension.  Musculoskeletal:        General: Swelling present.     Cervical back: Normal range of motion.     Comments: Significant pain with range of motion actively and passively of the right ankle.  Mild soft tissue swelling.  +2 DP pulses, + 2 PT.  Good capillary refill and perfusion to the foot.  Mild tenderness to the calf.  No tenderness to the upper leg or knee.  No overlying erythema warmth or induration.  Skin:    General: Skin is warm.     Capillary Refill: Capillary refill takes less than 2 seconds.  Neurological:     Mental Status: He is alert. Mental status is at baseline.  Psychiatric:        Mood and Affect: Mood normal.     IMPRESSION / MDM / ASSESSMENT AND PLAN / ED COURSE  I reviewed the triage vital signs and the nursing notes.  On  chart review patient has a history of atrial fibrillation and is on Eliquis and metoprolol and had a recent ablation.  Differential diagnosis including arterial occlusion, DVT, cellulitis, gout/pseudogout, cellulitis  Moderate risk Wells criteria will obtain an ultrasound to the right lower extremity  EKG  I, Corena Herter, the attending physician, personally viewed and interpreted this ECG.   Rate: Normal  Rhythm: Normal sinus  Axis: Normal  Intervals: Normal  ST&T Change: None  No tachycardic or bradycardic dysrhythmias while on cardiac telemetry.  RADIOLOGY Ultrasound DVT of the right leg -no finding of DVT  X-ray of the right ankle no acute fracture or dislocation.  No significant joint effusion  LABS (all labs ordered are listed, but only abnormal results are displayed) Labs interpreted as -    Labs Reviewed  BASIC METABOLIC PANEL - Abnormal; Notable for the following components:      Result Value   Potassium 3.4 (*)    Glucose, Bld 105 (*)    All other components within normal limits  LACTIC ACID, PLASMA  CBC WITH DIFFERENTIAL/PLATELET  URIC ACID  SEDIMENTATION RATE   C-REACTIVE PROTEIN  TYPE AND SCREEN     MDM  Given morphine for pain control.  Lab work overall reassuring with no significant leukocytosis and normal lactic acid.  No significant electrolyte abnormality.  Creatinine appears to be at his baseline.  Ultrasound DVT was negative.  X-ray of the right ankle no acute fracture or dislocation.  Patient has good palpable peripheral pulses and able to Doppler both pulses with good waveform.  Good capillary refill to the foot.    No significant pain out of proportion and have a low suspicion for necrotizing soft tissue infection.  No overlying erythema warmth or induration.  No fever, no leukocytosis have a low suspicion for cellulitis.  Questionably gout/pseudogout however would be less likely given he has no history of gout and no pain to his great toe.  Normal uric acid level and lab work.  I do not see a joint effusion for a joint aspiration.  Low suspicion for septic joint.  Able to range the ankle, no overlying erythema warmth, no leukocytosis.  Patient was given morphine and Toradol for pain control.  On reevaluation does state that he has had significant improvement of his pain but continues to hurt when ambulating.  Will do a short course of pain medication.  Discussed scheduling Tylenol given his anticoagulation use.  Discussed that he needed to be evaluated by primary care physician within the next 1 to 2 days for reevaluation for improvement of his symptoms.  Discussed to return to the emergency department if he had any worsening symptoms or ongoing symptoms.      PROCEDURES:  Critical Care performed: No  Procedures  Patient's presentation is most consistent with acute complicated illness / injury requiring diagnostic workup.   MEDICATIONS ORDERED IN ED: Medications  morphine (PF) 4 MG/ML injection 4 mg (4 mg Intravenous Given 04/25/23 0733)  ketorolac (TORADOL) 15 MG/ML injection 15 mg (15 mg Intravenous Given 04/25/23 0942)     FINAL CLINICAL IMPRESSION(S) / ED DIAGNOSES   Final diagnoses:  Foot pain, right     Rx / DC Orders   ED Discharge Orders          Ordered    oxyCODONE (ROXICODONE) 5 MG immediate release tablet  Every 6 hours PRN        04/25/23 0959  Note:  This document was prepared using Dragon voice recognition software and may include unintentional dictation errors.   Corena Herter, MD 04/25/23 1000

## 2023-04-25 NOTE — ED Notes (Signed)
 Pulses found in right foot by Pam RN, from previous shift. Pulses marked.

## 2023-04-27 ENCOUNTER — Ambulatory Visit: Payer: Self-pay | Admitting: Physician Assistant

## 2023-04-27 ENCOUNTER — Encounter: Payer: Self-pay | Admitting: Physician Assistant

## 2023-04-27 DIAGNOSIS — Z7689 Persons encountering health services in other specified circumstances: Secondary | ICD-10-CM

## 2023-04-27 NOTE — Progress Notes (Signed)
 Pt presents today for continued work note on light duty due to being on blood thinners still. 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 /CL,RMA  Pt also states his foot is feeling better from post ED visit but is still swollen.

## 2023-04-27 NOTE — Progress Notes (Signed)
   Subjective: Return to work evaluation    Patient ID: Allen Wilson, male    DOB: [birth year], 28 y.o.   MRN: [id number]  HPI Patient presents for evaluation secondary to emergency room visit on 04/25/2023.  Visit was due to right foot pain and edema.  Comprehensive workup was unremarkable.  Patient was placed on pain medication and advised to return if condition worsens.  Patient is status post heart ablation secondary to arterial fibrillation.  Patient is currently on Eliquis.  Patient advised to remain on Eliquis until evaluation by cardiologist in April.  Patient main concern today is a work note for light duty until cleared by cardiologist.   Review of Systems A-fib    Objective:   Physical Exam  Reviewed ER notes.  Physical exam deferred.      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  Continue previous medications.

## 2023-04-30 ENCOUNTER — Ambulatory Visit: Payer: 59

## 2023-04-30 DIAGNOSIS — I480 Paroxysmal atrial fibrillation: Secondary | ICD-10-CM | POA: Diagnosis not present

## 2023-04-30 LAB — CUP PACEART REMOTE DEVICE CHECK
Date Time Interrogation Session: 20250312231257
Implantable Pulse Generator Implant Date: 20240228

## 2023-05-03 ENCOUNTER — Encounter: Payer: Self-pay | Admitting: Cardiology

## 2023-05-05 ENCOUNTER — Other Ambulatory Visit: Payer: Self-pay | Admitting: Physician Assistant

## 2023-05-05 ENCOUNTER — Ambulatory Visit: Payer: Self-pay | Admitting: Physician Assistant

## 2023-05-05 ENCOUNTER — Encounter: Payer: Self-pay | Admitting: Physician Assistant

## 2023-05-05 VITALS — BP 114/71 | HR 109 | Temp 97.8°F | Resp 14 | Ht 73.0 in | Wt 278.0 lb

## 2023-05-05 DIAGNOSIS — Z760 Encounter for issue of repeat prescription: Secondary | ICD-10-CM

## 2023-05-05 MED ORDER — ESCITALOPRAM OXALATE 20 MG PO TABS: 20.0000 mg | ORAL_TABLET | Freq: Every day | ORAL | 3 refills | Status: DC

## 2023-05-05 MED ORDER — RISPERIDONE 1 MG PO TABS: 1.0000 mg | ORAL_TABLET | Freq: Every day | ORAL | 3 refills | Status: DC

## 2023-05-05 NOTE — Progress Notes (Deleted)
 [facility] [place] [facility] ____________________________________________   None    (approximate)  I have reviewed the triage vital signs and the nursing notes.   HISTORY  Chief Complaint No chief complaint on file.   HPI Allen Wilson is a 28 y.o. male patient presents for annual physical exam.  Recent heart ablation.         Past Medical History:  Diagnosis Date   A-fib [facility])    Tachycardia     Patient Active Problem List   Diagnosis Date Noted   Acute hypoxemic respiratory failure (HCC) 01/31/2023   Obesity (BMI 30-39.9) 01/31/2023   CAP (community acquired pneumonia) 01/30/2023   PNA (pneumonia) 01/30/2023   Palpitation 11/27/2022   Paroxysmal atrial fibrillation (HCC) 08/12/2021   Cellulitis of left forearm 02/11/2020   History of depression 02/11/2020   Sepsis (HCC) 02/11/2020   Cellulitis of left arm 02/11/2020   Severe major depression, single episode (HCC) 02/16/2018   Poisoning by anticholinergic drug, intentional self-harm, initial encounter (HCC) 02/15/2018   Tachyarrhythmia 02/15/2018   Acute psychosis (HCC)    Drug overdose 09/17/2016    Past Surgical History:  Procedure Laterality Date   ABLATION     04/10/2023   TYMPANOSTOMY TUBE PLACEMENT     childhood    Prior to Admission medications   Medication Sig Start Date End Date Taking? Authorizing Provider  ALPRAZolam Prudy Feeler) 0.5 MG tablet Take 0.5 mg by mouth daily as needed for anxiety. 10/23/22   [provider]  diltiazem (CARDIZEM CD) 300 MG 24 hr capsule Take 1 capsule (300 mg total) by mouth daily. 10/31/22   Joellen Jersey., MD  diltiazem (CARDIZEM) 30 MG tablet TAKE ONE TABLET BY MOUTH TWICE DAILY AS NEEDED (HR>100) 04/29/22   Lanier Prude, MD  escitalopram (LEXAPRO) 20 MG tablet Take 20 mg by mouth daily. 01/05/23   [provider]  potassium chloride SA (KLOR-CON M) 20 MEQ tablet Take 1 tablet (20 mEq total) by mouth 2 (two)  times daily. 10/31/22   Joellen Jersey., MD  promethazine-dextromethorphan (PROMETHAZINE-DM) 6.25-15 MG/5ML syrup Take by mouth. Patient not taking: Reported on 02/02/2023 01/26/23   [provider]  risperiDONE (RISPERDAL) 1 MG tablet Take 1 mg by mouth at bedtime. 09/05/22   [provider]    Allergies Patient has no known allergies.  Family History  Problem Relation Age of Onset   Other Mother        alive & well.   Arthritis/Rheumatoid Mother    Hypertension Father    Stroke Maternal Grandmother    Heart attack Maternal Grandfather        x 2 - first in his 45's.    Social History Social History   Tobacco Use   Smoking status: Never   Smokeless tobacco: Current    Types: Chew  Vaping Use   Vaping status: Never Used  Substance Use Topics   Alcohol use: Yes    Alcohol/week: 5.0 standard drinks of alcohol    Types: 5 Cans of beer per week   Drug use: No    Review of Systems Constitutional: No fever/chills Eyes: No visual changes. ENT: No sore throat. Cardiovascular: Denies chest pain.  A-fib Respiratory: Denies shortness of breath. Gastrointestinal: No abdominal pain.  No nausea, no vomiting.  No diarrhea.  No constipation. Genitourinary: Negative for dysuria. Musculoskeletal: Negative for back pain. Skin: Negative for rash. Neurological: Negative for headaches, focal weakness or numbness. Psychiatric: Anxiety   ____________________________________________  PHYSICAL EXAM:  VITAL SIGNS:  Constitutional: Alert and oriented. Well appearing and in no acute distress. Eyes: Conjunctivae are normal. PERRL. EOMI. Head: Atraumatic. Nose: No congestion/rhinnorhea. Mouth/Throat: Mucous membranes are moist.  Oropharynx non-erythematous. Neck: No stridor.  {**No cervical spine tenderness to palpation.**} {**Hematological/Lymphatic/Immunilogical: No cervical lymphadenopathy. **}Cardiovascular: Normal rate, regular rhythm. Grossly normal heart  sounds.  Good peripheral circulation. Respiratory: Normal respiratory effort.  No retractions. Lungs CTAB. Gastrointestinal: Soft and nontender. No distention. No abdominal bruits. No CVA tenderness. {**Genitourinary: Deferred **}Musculoskeletal: No lower extremity tenderness nor edema.  No joint effusions. Neurologic:  Normal speech and language. No gross focal neurologic deficits are appreciated. No gait instability. Skin:  Skin is warm, dry and intact. No rash noted. Psychiatric: Mood and affect are normal. Speech and behavior are normal.  ____________________________________________   LABS (all labs ordered are listed, but only abnormal results are displayed)  @EDLABS @ ____________________________________________  EKG  Sinus bradycardia 59 bpm occasional PACs.  ____________________________________________    ____________________________________________   INITIAL IMPRESSION / ASSESSMENT AND PLAN  As part of my medical decision making, I reviewed the following data within the electronic MEDICAL RECORD NUMBER              ____________________________________________   FINAL CLINICAL IMPRESSION    ED Discharge Orders     None        Note:  This document was prepared using Dragon voice recognition software and may include unintentional dictation errors.

## 2023-05-05 NOTE — Progress Notes (Signed)
 Requesting Rx refills for Lexapro 20 mg & Risperidone 1mg   States Allen Wilson (Psychiatrist) previously prescribing. Cost $200.00 every time he goes to her for refills.  She advised him to see if he can get his PCP to do refills for him. Just had ablation for A-Fib & is on restricted duty (8-5) as long as he's taking Eliquis. Still has heart loop recorder - said he was told he would have it until the battery runs out.  Has been w/out anxiety meds for 2 weeks.

## 2023-05-05 NOTE — Progress Notes (Signed)
   Subjective:Medication refill    Patient ID: Allen Wilson, male    DOB: [birth year], 28 y.o.   MRN: [id number]  HPI Request refill of Risperdal and Lexapro.   Review of Systems A-fib, depression, and obstructive sleep apnea.    Objective:   Physical Exam BP 114/71  BP Location Left Arm  Patient Position Sitting  Cuff Size Large  Pulse Rate 109Pulse Rate. 109. Data is abnormal. Taken on 05/05/23 10:17 AM  Temp 97.8 F (36.6 C)  Temp Source Temporal  Weight 278 lb (126.1 kg)  Height 6\' 1"  (1.854 m)  Resp 14  SpO2 96 %   BMI: 36.68 kg/m2  BSA: 2.55 m2     Physical exam was deferred.    Assessment & Plan: Medication refill   Prescription for Lexapro and Risperdal refilled.

## 2023-05-08 NOTE — Progress Notes (Signed)
 [facility] Summary Report / Loop Recorder

## 2023-05-08 NOTE — Addendum Note (Signed)
 Addended by: Geralyn Flash D on: 05/08/2023 04:15 PM   Modules accepted: Orders

## 2023-06-01 LAB — CUP PACEART REMOTE DEVICE CHECK
Date Time Interrogation Session: 20250413231812
Implantable Pulse Generator Implant Date: 20240228

## 2023-06-02 ENCOUNTER — Encounter: Payer: Self-pay | Admitting: Cardiology

## 2023-06-04 ENCOUNTER — Ambulatory Visit (INDEPENDENT_AMBULATORY_CARE_PROVIDER_SITE_OTHER): Payer: 59

## 2023-06-04 DIAGNOSIS — I471 Supraventricular tachycardia, unspecified: Secondary | ICD-10-CM | POA: Diagnosis not present

## 2023-06-10 ENCOUNTER — Ambulatory Visit: Payer: Self-pay

## 2023-06-10 DIAGNOSIS — Z0289 Encounter for other administrative examinations: Secondary | ICD-10-CM

## 2023-06-11 ENCOUNTER — Ambulatory Visit: Payer: Self-pay | Admitting: Physician Assistant

## 2023-06-11 ENCOUNTER — Encounter: Payer: Self-pay | Admitting: Physician Assistant

## 2023-06-11 ENCOUNTER — Other Ambulatory Visit: Payer: Self-pay

## 2023-06-11 VITALS — BP 122/75 | HR 70 | Temp 98.2°F | Resp 12

## 2023-06-11 DIAGNOSIS — F411 Generalized anxiety disorder: Secondary | ICD-10-CM

## 2023-06-11 DIAGNOSIS — Z7689 Persons encountering health services in other specified circumstances: Secondary | ICD-10-CM

## 2023-06-11 MED ORDER — ESCITALOPRAM OXALATE 20 MG PO TABS: 20.0000 mg | ORAL_TABLET | Freq: Every day | ORAL | 3 refills | Status: AC

## 2023-06-11 NOTE — Progress Notes (Signed)
   Subjective: Return to work evaluation.    Patient ID: Allen Wilson, male    DOB: [birth year], 28 y.o.   MRN: [id number]  HPI Patient completed labs dose of Eliquis last night.  Release to full duty by cardiologist.   Review of Systems PAF and obesity.    Objective:   Physical Exam BP 122/75  BP Location Left Arm  Patient Position Sitting  Cuff Size Large  Pulse Rate 70  Temp 98.2 F (36.8 C)  Temp Source Temporal  Resp 12  SpO2 97 %  No acute distress.   HEENT is unremarkable. Neck is supple for lymphadenopathy or bruits. Lungs are clear to auscultation. Heart regular rate and rhythm.       Assessment & Plan: Return to work evaluation   Patient return to full duty effective 06/15/2023.  Return back to clinic if condition worsens.

## 2023-06-11 NOTE — Progress Notes (Signed)
 Last night was last dose of Eliquis  Requesting Rx refill for Lexapro   Return to work - Development worker, international aid  released him w/o restrictions

## 2023-06-12 LAB — CMP12+LP+TP+TSH+6AC+CBC/D/PLT
ALT: 87 IU/L — ABNORMAL HIGH (ref 0–44)
AST: 40 IU/L (ref 0–40)
Albumin: 4.5 g/dL (ref 4.3–5.2)
Alkaline Phosphatase: 83 IU/L (ref 44–121)
BUN/Creatinine Ratio: 12 (ref 9–20)
BUN: 12 mg/dL (ref 6–20)
Basophils Absolute: 0.1 10*3/uL (ref 0.0–0.2)
Basos: 1 %
Bilirubin Total: 0.3 mg/dL (ref 0.0–1.2)
Calcium: 9.7 mg/dL (ref 8.7–10.2)
Chloride: 102 mmol/L (ref 96–106)
Chol/HDL Ratio: 7.4 ratio — ABNORMAL HIGH (ref 0.0–5.0)
Cholesterol, Total: 237 mg/dL — ABNORMAL HIGH (ref 100–199)
Creatinine, Ser: 0.99 mg/dL (ref 0.76–1.27)
EOS (ABSOLUTE): 0 10*3/uL (ref 0.0–0.4)
Eos: 1 %
Estimated CHD Risk: 1.5 times avg. — ABNORMAL HIGH (ref 0.0–1.0)
Free Thyroxine Index: 1.8 (ref 1.2–4.9)
GGT: 26 IU/L (ref 0–65)
Globulin, Total: 2.9 g/dL (ref 1.5–4.5)
Glucose: 97 mg/dL (ref 70–99)
HDL: 32 mg/dL — ABNORMAL LOW (ref >39)
Hematocrit: 46.8 % (ref 37.5–51.0)
Hemoglobin: 16.1 g/dL (ref 13.0–17.7)
Immature Grans (Abs): 0.1 10*3/uL (ref 0.0–0.1)
Immature Granulocytes: 1 %
Iron: 110 ug/dL (ref 38–169)
LDH: 177 IU/L (ref 121–224)
LDL Chol Calc (NIH): 125 mg/dL — ABNORMAL HIGH (ref 0–99)
Lymphocytes Absolute: 2.9 10*3/uL (ref 0.7–3.1)
Lymphs: 36 %
MCH: 30 pg (ref 26.6–33.0)
MCHC: 34.4 g/dL (ref 31.5–35.7)
MCV: 87 fL (ref 79–97)
Monocytes Absolute: 0.6 10*3/uL (ref 0.1–0.9)
Monocytes: 7 %
Neutrophils Absolute: 4.4 10*3/uL (ref 1.4–7.0)
Neutrophils: 54 %
Phosphorus: 2.9 mg/dL (ref 2.8–4.1)
Platelets: 249 10*3/uL (ref 150–450)
Potassium: 4 mmol/L (ref 3.5–5.2)
RBC: 5.37 x10E6/uL (ref 4.14–5.80)
RDW: 12.9 % (ref 11.6–15.4)
Sodium: 141 mmol/L (ref 134–144)
T3 Uptake Ratio: 28 % (ref 24–39)
T4, Total: 6.5 ug/dL (ref 4.5–12.0)
TSH: 1.31 u[IU]/mL (ref 0.450–4.500)
Total Protein: 7.4 g/dL (ref 6.0–8.5)
Triglycerides: 450 mg/dL — ABNORMAL HIGH (ref 0–149)
Uric Acid: 10.5 mg/dL — ABNORMAL HIGH (ref 3.8–8.4)
VLDL Cholesterol Cal: 80 mg/dL — ABNORMAL HIGH (ref 5–40)
WBC: 8 10*3/uL (ref 3.4–10.8)
eGFR: 107 mL/min/{1.73_m2} (ref >59)

## 2023-06-12 NOTE — Progress Notes (Signed)
 [facility] Summary Report / Loop Recorder

## 2023-06-16 ENCOUNTER — Encounter: Payer: Self-pay | Admitting: Physician Assistant

## 2023-06-16 ENCOUNTER — Ambulatory Visit: Payer: Self-pay | Admitting: Physician Assistant

## 2023-06-16 VITALS — BP 122/83 | HR 98 | Temp 98.1°F | Resp 12 | Ht 73.0 in | Wt 265.0 lb

## 2023-06-16 DIAGNOSIS — Z0289 Encounter for other administrative examinations: Secondary | ICD-10-CM

## 2023-06-16 DIAGNOSIS — E782 Mixed hyperlipidemia: Secondary | ICD-10-CM

## 2023-06-16 MED ORDER — ROSUVASTATIN CALCIUM 40 MG PO TABS
40.0000 mg | ORAL_TABLET | Freq: Every day | ORAL | 3 refills | Status: AC
Start: 2023-06-16 — End: ?

## 2023-06-16 NOTE — Progress Notes (Signed)
 [facility] [place] [facility]  ____________________________________________   None    (approximate)  I have reviewed the triage vital signs and the nursing notes.   HISTORY  Chief Complaint No chief complaint on file.   HPI Allen Wilson is a 28 y.o. male patient presents for annual physical exam.  Patient recently completed course of Eliquis secondary to cardiac ablation.         Past Medical History:  Diagnosis Date   A-fib [facility])    Tachycardia     Patient Active Problem List   Diagnosis Date Noted   Obstructive sleep apnea syndrome 04/03/2023   Class 1 obesity due to excess calories with serious comorbidity and body mass index (BMI) of 32.0 to 32.9 in adult 02/24/2023   Acute hypoxemic respiratory failure (HCC) 01/31/2023   Obesity (BMI 30-39.9) 01/31/2023   CAP (community acquired pneumonia) 01/30/2023   PNA (pneumonia) 01/30/2023   Palpitation 11/27/2022   Paroxysmal atrial fibrillation (HCC) 08/12/2021   Cellulitis of left forearm 02/11/2020   History of depression 02/11/2020   Sepsis (HCC) 02/11/2020   Cellulitis of left arm 02/11/2020   Severe major depression, single episode (HCC) 02/16/2018   Poisoning by anticholinergic drug, intentional self-harm, initial encounter (HCC) 02/15/2018   Tachyarrhythmia 02/15/2018   Acute psychosis (HCC)    Drug overdose 09/17/2016    Past Surgical History:  Procedure Laterality Date   ABLATION     04/10/2023   TYMPANOSTOMY TUBE PLACEMENT     childhood    Prior to Admission medications   Medication Sig Start Date End Date Taking? Authorizing Provider  ALPRAZolam  (XANAX ) 0.5 MG tablet Take 0.5 mg by mouth daily as needed for anxiety. 10/23/22   [provider]  apixaban (ELIQUIS) 5 MG TABS tablet Take by mouth. Patient not taking: Reported on 06/11/2023 02/25/23 08/24/23  [provider]  diltiazem  (CARDIZEM ) 30 MG tablet TAKE ONE TABLET BY MOUTH TWICE DAILY AS NEEDED  (HR>100) 04/29/22   Boyce Byes, MD  escitalopram  (LEXAPRO ) 20 MG tablet Take 1 tablet (20 mg total) by mouth daily. 06/11/23   Marcina Severe, PA-C  metoprolol  succinate (TOPROL -XL) 25 MG 24 hr tablet Take by mouth. 04/10/23 04/09/24  [provider]    Allergies Patient has no known allergies.  Family History  Problem Relation Age of Onset   Other Mother        alive & well.   Arthritis/Rheumatoid Mother    Hypertension Father    Stroke Maternal Grandmother    Heart attack Maternal Grandfather        x 2 - first in his 85's.    Social History Social History   Tobacco Use   Smoking status: Never   Smokeless tobacco: Current    Types: Chew  Vaping Use   Vaping status: Never Used  Substance Use Topics   Alcohol use: Yes    Alcohol/week: 5.0 standard drinks of alcohol    Types: 5 Cans of beer per week   Drug use: No    Review of Systems Constitutional: No fever/chills.  Obesity Eyes: No visual changes. ENT: No sore throat. Cardiovascular: Denies chest pain.  A-fib Respiratory: Denies shortness of breath.  Sleep apnea Gastrointestinal: No abdominal pain.  No nausea, no vomiting.  No diarrhea.  No constipation. Genitourinary: Negative for dysuria. Musculoskeletal: Negative for back pain. Skin: Negative for rash. Neurological: Negative for headaches, focal weakness or numbness.   ____________________________________________   PHYSICAL EXAM:  VITAL SIGNS:  Constitutional: Alert and oriented. Well appearing and in no acute distress. Eyes: Conjunctivae are normal. PERRL. EOMI. BP 122/83  Cuff Size Large  Pulse Rate 98  Temp 98.1 F (36.7 C)  Temp Source Temporal  Weight 265 lb (120.2 kg)  Height 6\' 1"  (1.854 m)  Resp 12   BMI: 34.96 kg/m2  BSA: 2.49 m2   Head: Atraumatic. Nose: No congestion/rhinnorhea. Mouth/Throat: Mucous membranes are moist.  Oropharynx non-erythematous. Neck: No stridor.  No cervical spine tenderness to  palpation. Hematological/Lymphatic/Immunilogical: No cervical lymphadenopathy. Cardiovascular: Normal rate, regular rhythm. Grossly normal heart sounds.  Good peripheral circulation. Respiratory: Normal respiratory effort.  No retractions. Lungs CTAB. Gastrointestinal: Soft and nontender. No distention. No abdominal bruits. No CVA tenderness. Genitourinary: Deferred Musculoskeletal: No lower extremity tenderness nor edema.  No joint effusions. Neurologic:  Normal speech and language. No gross focal neurologic deficits are appreciated. No gait instability. Skin:  Skin is warm, dry and intact. No rash noted. Psychiatric: Mood and affect are normal. Speech and behavior are normal.  ____________________________________________   LABS _           Component Ref Range & Units (hover) 6 d ago (06/10/23) 1 mo ago (04/25/23) 1 mo ago (04/25/23) 4 mo ago (01/31/23) 4 mo ago (01/30/23) 4 mo ago (01/30/23) 7 mo ago (11/12/22)  Glucose 97 105 High  CM   103 High  CM  78  Uric Acid 10.5 High         Comment:            Therapeutic target for gout patients: <6.0  BUN 12 16   11  16   Creatinine, Ser 0.99 0.94 R   1.12 R  0.94  eGFR 107      115  BUN/Creatinine Ratio 12      17  Sodium 141 135 R   138 R  142  Potassium 4.0 3.4 Low  R   4.1 R  4.6  Chloride 102 103 R   103 R  101  Calcium  9.7 9.1 R   9.4 R  10.2  Phosphorus 2.9        Total Protein 7.4        Albumin 4.5        Globulin, Total 2.9        Bilirubin Total 0.3        Alkaline Phosphatase 83        LDH 177        AST 40        ALT 87 High         GGT 26        Iron 110        Cholesterol, Total 237 High         Triglycerides 450 High         HDL 32 Low         VLDL Cholesterol Cal 80 High         LDL Chol Calc (NIH) 125 High         Chol/HDL Ratio 7.4 High         Comment:                                   T. Chol/HDL Ratio  Men  Women                               1/2 Avg.Risk   3.4    3.3                                   Avg.Risk  5.0    4.4                                2X Avg.Risk  9.6    7.1                                3X Avg.Risk 23.4   11.0  Estimated CHD Risk 1.5 High         Comment: The CHD Risk is based on the T. Chol/HDL ratio. Other factors affect CHD Risk such as hypertension, smoking, diabetes, severe obesity, and family history of premature CHD.  TSH 1.310        T4, Total 6.5        T3 Uptake Ratio 28        Free Thyroxine Index 1.8        WBC 8.0  10.4 R 9.0 R  12.6 High  R   RBC 5.37  4.85 R 4.60 R  5.02 R   Hemoglobin 16.1  14.3 R 13.8 R  15.0 R   Hematocrit 46.8  41.8 R 40.8 R  44.6 R   MCV 87  86.2 R 88.7 R  88.8 R   MCH 30.0  29.5 R 30.0 R  29.9 R   MCHC 34.4  34.2 R 33.8 R  33.6 R   RDW 12.9  12.0 R 12.1 R  12.1 R   Platelets 249  253 R 265 R  276 R   Neutrophils 54  60 R      Lymphs 36        Monocytes 7        Eos 1        Basos 1        Neutrophils Absolute 4.4  6.3 R      Lymphocytes Absolute 2.9  3.1 R      Monocytes Absolute 0.6        EOS (ABSOLUTE) 0.0        Basophils Absolute 0.1  0.1 R      Immature Granulocytes 1  0 R      Immature Grans (Abs) 0.1                   ___________________________________________  EKG  Normal sinus rhythm at 79 bpm.  Occasionally PACs ____________________________________________    ____________________________________________   INITIAL IMPRESSION / ASSESSMENT AND PLAN  As part of my medical decision making, I reviewed the following data within the electronic MEDICAL RECORD NUMBER Notes from prior ED visits and [place] Controlled Substance Database     No acute findings on physical exam and EKG.  Labs shows mixed hyperlipidemia.  Patient is amenable to starting statin and will follow-up in 3 months.        ____________________________________________   FINAL CLINICAL IMPRESSION    ED Discharge Orders     None  Note:  This document was prepared using  Dragon voice recognition software and may include unintentional dictation errors.

## 2023-06-16 NOTE — Progress Notes (Signed)
 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

## 2023-07-09 ENCOUNTER — Ambulatory Visit (INDEPENDENT_AMBULATORY_CARE_PROVIDER_SITE_OTHER): Payer: 59

## 2023-07-09 ENCOUNTER — Ambulatory Visit: Payer: Self-pay | Admitting: Cardiology

## 2023-07-09 DIAGNOSIS — I471 Supraventricular tachycardia, unspecified: Secondary | ICD-10-CM

## 2023-07-09 LAB — CUP PACEART REMOTE DEVICE CHECK
Date Time Interrogation Session: 20250521232426
Implantable Pulse Generator Implant Date: 20240228

## 2023-07-15 NOTE — Progress Notes (Signed)
 [facility] Summary Report / Loop Recorder

## 2023-08-10 ENCOUNTER — Ambulatory Visit (INDEPENDENT_AMBULATORY_CARE_PROVIDER_SITE_OTHER)

## 2023-08-10 DIAGNOSIS — I480 Paroxysmal atrial fibrillation: Secondary | ICD-10-CM

## 2023-08-10 LAB — CUP PACEART REMOTE DEVICE CHECK
Date Time Interrogation Session: 20250622233912
Implantable Pulse Generator Implant Date: 20240228

## 2023-08-11 ENCOUNTER — Ambulatory Visit: Payer: Self-pay | Admitting: Cardiology

## 2023-08-12 ENCOUNTER — Other Ambulatory Visit: Payer: Self-pay

## 2023-08-12 DIAGNOSIS — I480 Paroxysmal atrial fibrillation: Secondary | ICD-10-CM

## 2023-08-12 MED ORDER — METOPROLOL SUCCINATE ER 25 MG PO TB24: 25.0000 mg | ORAL_TABLET | Freq: Two times a day (BID) | ORAL | 11 refills | Status: AC

## 2023-08-19 ENCOUNTER — Telehealth: Payer: Self-pay

## 2023-08-19 NOTE — Telephone Encounter (Addendum)
 Alert received from [employer]:  Alert remote transmission:  Tachy Event occurred 6/26 @ 16:01, duration 22sec, HR 188-194, NCT - Route to triage  Outreach made to Pt.  Left message requesting call back. Will send MyChart message.

## 2023-08-24 NOTE — Telephone Encounter (Signed)
Attempted to contact patient. No answer, left message to call back

## 2023-08-26 NOTE — Progress Notes (Signed)
 [facility] Summary Report / Loop Recorder

## 2023-08-27 ENCOUNTER — Encounter: Payer: Self-pay | Admitting: *Deleted

## 2023-08-27 NOTE — Telephone Encounter (Signed)
 3rd attempt to contact the patient. No answer- today's message states the patient's voice mail is not set up.  Verified the contact # multiple times and it was correct as listed on file for the patient.  The patient has not reviewed his previously sent MyChart message either.  Letter mailed to the patient to please call back to the [facility]/ respond to his Mychart message to see how he was feeling during that time.  Closing this encounter.

## 2023-09-08 DIAGNOSIS — Z9889 Other specified postprocedural states: Secondary | ICD-10-CM | POA: Diagnosis not present

## 2023-09-08 DIAGNOSIS — I480 Paroxysmal atrial fibrillation: Secondary | ICD-10-CM | POA: Diagnosis not present

## 2023-09-08 DIAGNOSIS — Z8679 Personal history of other diseases of the circulatory system: Secondary | ICD-10-CM | POA: Diagnosis not present

## 2023-09-08 DIAGNOSIS — Z4509 Encounter for adjustment and management of other cardiac device: Secondary | ICD-10-CM | POA: Diagnosis not present

## 2023-09-08 DIAGNOSIS — I499 Cardiac arrhythmia, unspecified: Secondary | ICD-10-CM | POA: Diagnosis not present

## 2023-09-10 ENCOUNTER — Ambulatory Visit: Payer: Self-pay | Admitting: Cardiology

## 2023-09-10 ENCOUNTER — Ambulatory Visit

## 2023-09-10 DIAGNOSIS — I480 Paroxysmal atrial fibrillation: Secondary | ICD-10-CM

## 2023-09-10 LAB — CUP PACEART REMOTE DEVICE CHECK
Date Time Interrogation Session: 20250723233534
Implantable Pulse Generator Implant Date: 20240228

## 2023-09-14 ENCOUNTER — Other Ambulatory Visit: Payer: Self-pay

## 2023-09-14 DIAGNOSIS — E782 Mixed hyperlipidemia: Secondary | ICD-10-CM

## 2023-09-14 NOTE — Progress Notes (Signed)
 [facility] Summary Report / Loop Recorder

## 2023-09-15 LAB — LIPID PANEL
Chol/HDL Ratio: 5.5 ratio — ABNORMAL HIGH (ref 0.0–5.0)
Cholesterol, Total: 144 mg/dL (ref 100–199)
HDL: 26 mg/dL — ABNORMAL LOW (ref >39)
LDL Chol Calc (NIH): 76 mg/dL (ref 0–99)
Triglycerides: 251 mg/dL — ABNORMAL HIGH (ref 0–149)
VLDL Cholesterol Cal: 42 mg/dL — ABNORMAL HIGH (ref 5–40)

## 2023-09-23 ENCOUNTER — Telehealth: Payer: Self-pay

## 2023-09-23 NOTE — Telephone Encounter (Signed)
 Called pt lvm wanting to know if he wants to be released to [facility]

## 2023-09-24 NOTE — Telephone Encounter (Signed)
 Spoke with pt and will release to [facility]

## 2023-10-12 ENCOUNTER — Ambulatory Visit

## 2023-10-12 ENCOUNTER — Encounter

## 2023-10-12 DIAGNOSIS — I480 Paroxysmal atrial fibrillation: Secondary | ICD-10-CM

## 2023-10-13 LAB — CUP PACEART REMOTE DEVICE CHECK
Date Time Interrogation Session: 20250823232222
Implantable Pulse Generator Implant Date: 20240228

## 2023-10-14 ENCOUNTER — Ambulatory Visit: Payer: Self-pay | Admitting: Cardiology

## 2023-11-04 NOTE — Progress Notes (Signed)
 Remote Loop Recorder Transmission

## 2023-11-12 ENCOUNTER — Encounter

## 2023-11-19 NOTE — Progress Notes (Signed)
 Remote Loop Recorder Transmission

## 2023-12-13 ENCOUNTER — Encounter

## 2023-12-13 DIAGNOSIS — R002 Palpitations: Secondary | ICD-10-CM | POA: Diagnosis not present

## 2023-12-13 DIAGNOSIS — I480 Paroxysmal atrial fibrillation: Secondary | ICD-10-CM | POA: Diagnosis not present

## 2023-12-13 DIAGNOSIS — R000 Tachycardia, unspecified: Secondary | ICD-10-CM | POA: Diagnosis not present

## 2023-12-13 DIAGNOSIS — G4733 Obstructive sleep apnea (adult) (pediatric): Secondary | ICD-10-CM | POA: Diagnosis not present

## 2024-01-13 ENCOUNTER — Encounter

## 2024-01-22 DIAGNOSIS — M9903 Segmental and somatic dysfunction of lumbar region: Secondary | ICD-10-CM | POA: Diagnosis not present

## 2024-01-22 DIAGNOSIS — M9905 Segmental and somatic dysfunction of pelvic region: Secondary | ICD-10-CM | POA: Diagnosis not present

## 2024-01-22 DIAGNOSIS — M62830 Muscle spasm of back: Secondary | ICD-10-CM | POA: Diagnosis not present

## 2024-01-22 DIAGNOSIS — M545 Low back pain, unspecified: Secondary | ICD-10-CM | POA: Diagnosis not present

## 2024-01-25 DIAGNOSIS — M545 Low back pain, unspecified: Secondary | ICD-10-CM | POA: Diagnosis not present

## 2024-01-25 DIAGNOSIS — M9903 Segmental and somatic dysfunction of lumbar region: Secondary | ICD-10-CM | POA: Diagnosis not present

## 2024-01-25 DIAGNOSIS — M9905 Segmental and somatic dysfunction of pelvic region: Secondary | ICD-10-CM | POA: Diagnosis not present

## 2024-01-25 DIAGNOSIS — M62830 Muscle spasm of back: Secondary | ICD-10-CM | POA: Diagnosis not present

## 2024-01-27 DIAGNOSIS — M545 Low back pain, unspecified: Secondary | ICD-10-CM | POA: Diagnosis not present

## 2024-01-27 DIAGNOSIS — M9903 Segmental and somatic dysfunction of lumbar region: Secondary | ICD-10-CM | POA: Diagnosis not present

## 2024-01-27 DIAGNOSIS — M62830 Muscle spasm of back: Secondary | ICD-10-CM | POA: Diagnosis not present

## 2024-01-27 DIAGNOSIS — M9905 Segmental and somatic dysfunction of pelvic region: Secondary | ICD-10-CM | POA: Diagnosis not present

## 2024-02-02 DIAGNOSIS — M9905 Segmental and somatic dysfunction of pelvic region: Secondary | ICD-10-CM | POA: Diagnosis not present

## 2024-02-02 DIAGNOSIS — M62830 Muscle spasm of back: Secondary | ICD-10-CM | POA: Diagnosis not present

## 2024-02-02 DIAGNOSIS — M545 Low back pain, unspecified: Secondary | ICD-10-CM | POA: Diagnosis not present

## 2024-02-02 DIAGNOSIS — M9903 Segmental and somatic dysfunction of lumbar region: Secondary | ICD-10-CM | POA: Diagnosis not present

## 2024-02-09 DIAGNOSIS — M62830 Muscle spasm of back: Secondary | ICD-10-CM | POA: Diagnosis not present

## 2024-02-09 DIAGNOSIS — M545 Low back pain, unspecified: Secondary | ICD-10-CM | POA: Diagnosis not present

## 2024-02-09 DIAGNOSIS — M9903 Segmental and somatic dysfunction of lumbar region: Secondary | ICD-10-CM | POA: Diagnosis not present

## 2024-02-09 DIAGNOSIS — M9905 Segmental and somatic dysfunction of pelvic region: Secondary | ICD-10-CM | POA: Diagnosis not present

## 2024-02-12 DIAGNOSIS — M62830 Muscle spasm of back: Secondary | ICD-10-CM | POA: Diagnosis not present

## 2024-02-12 DIAGNOSIS — M9903 Segmental and somatic dysfunction of lumbar region: Secondary | ICD-10-CM | POA: Diagnosis not present

## 2024-02-12 DIAGNOSIS — M545 Low back pain, unspecified: Secondary | ICD-10-CM | POA: Diagnosis not present

## 2024-02-12 DIAGNOSIS — M9905 Segmental and somatic dysfunction of pelvic region: Secondary | ICD-10-CM | POA: Diagnosis not present

## 2024-02-13 ENCOUNTER — Encounter

## 2024-03-15 ENCOUNTER — Encounter

## 2024-04-15 ENCOUNTER — Encounter

## 2024-05-16 ENCOUNTER — Encounter

## 2024-06-16 ENCOUNTER — Encounter
